# Patient Record
Sex: Female | Born: 1980 | Hispanic: Yes | Marital: Single | Smoking: Former smoker
Health system: Southern US, Community
[De-identification: ages and names within clinical notes are randomized; demographics above are authoritative.]

## PROBLEM LIST (undated history)

## (undated) ENCOUNTER — Inpatient Hospital Stay (HOSPITAL_COMMUNITY): Payer: Self-pay

## (undated) DIAGNOSIS — O1490 Unspecified pre-eclampsia, unspecified trimester: Secondary | ICD-10-CM

## (undated) DIAGNOSIS — O24419 Gestational diabetes mellitus in pregnancy, unspecified control: Secondary | ICD-10-CM

## (undated) DIAGNOSIS — Z87442 Personal history of urinary calculi: Secondary | ICD-10-CM

## (undated) DIAGNOSIS — I10 Essential (primary) hypertension: Secondary | ICD-10-CM

## (undated) HISTORY — DX: Gestational diabetes mellitus in pregnancy, unspecified control: O24.419

## (undated) HISTORY — DX: Unspecified pre-eclampsia, unspecified trimester: O14.90

---

## 2014-11-12 NOTE — L&D Delivery Note (Cosign Needed)
Delivery Note At  a viable female was delivered via  (Presentation:vertex ;loa  ).  APGAR:8 ,9 ; weight  .   Placenta status:spont ,via shultz .  Cord:3vc  with the following complications: none.  Cord pH: n/a  Anesthesia: Epidural  Episiotomy:  none Lacerations:  1st degrees perineal Suture Repair: 2.0 vicryl Est. Blood Loss150 (mL):    Mom to postpartum.  Baby to Couplet care / Skin to Skin.  Koren Shiver DARLENE 08/28/2015, 8:37 AM

## 2015-04-25 ENCOUNTER — Other Ambulatory Visit (HOSPITAL_COMMUNITY): Payer: Self-pay | Admitting: Physician Assistant

## 2015-04-25 DIAGNOSIS — Z3A21 21 weeks gestation of pregnancy: Secondary | ICD-10-CM

## 2015-04-25 DIAGNOSIS — Z3689 Encounter for other specified antenatal screening: Secondary | ICD-10-CM

## 2015-04-25 DIAGNOSIS — O09292 Supervision of pregnancy with other poor reproductive or obstetric history, second trimester: Secondary | ICD-10-CM

## 2015-04-25 LAB — OB RESULTS CONSOLE HEPATITIS B SURFACE ANTIGEN: Hepatitis B Surface Ag: NEGATIVE

## 2015-04-25 LAB — OB RESULTS CONSOLE HGB/HCT, BLOOD
HCT: 35 %
HEMOGLOBIN: 11.2 g/dL

## 2015-04-25 LAB — OB RESULTS CONSOLE ANTIBODY SCREEN: Antibody Screen: NEGATIVE

## 2015-04-25 LAB — GLUCOSE TOLERANCE, 1 HOUR (50G) W/O FASTING: Glucose, GTT - 1 Hour: 132 mg/dL (ref ?–200)

## 2015-04-25 LAB — SICKLE CELL SCREEN: Sickle Cell Screen: NORMAL

## 2015-04-25 LAB — OB RESULTS CONSOLE GC/CHLAMYDIA
Chlamydia: NEGATIVE
Gonorrhea: NEGATIVE

## 2015-04-25 LAB — OB RESULTS CONSOLE HIV ANTIBODY (ROUTINE TESTING): HIV: NONREACTIVE

## 2015-04-25 LAB — CYTOLOGY - PAP: Pap: NEGATIVE

## 2015-04-25 LAB — OB RESULTS CONSOLE PLATELET COUNT: PLATELETS: 274 10*3/uL

## 2015-04-25 LAB — CYSTIC FIBROSIS DIAGNOSTIC STUDY

## 2015-04-25 LAB — OB RESULTS CONSOLE VARICELLA ZOSTER ANTIBODY, IGG
VARICELLA IGG: IMMUNE
Varicella: IMMUNE

## 2015-04-25 LAB — OB RESULTS CONSOLE ABO/RH: RH Type: POSITIVE

## 2015-04-25 LAB — OB RESULTS CONSOLE RPR: RPR: NONREACTIVE

## 2015-04-25 LAB — OB RESULTS CONSOLE RUBELLA ANTIBODY, IGM: RUBELLA: IMMUNE

## 2015-04-27 ENCOUNTER — Ambulatory Visit (HOSPITAL_COMMUNITY)
Admission: RE | Admit: 2015-04-27 | Discharge: 2015-04-27 | Disposition: A | Discharge status: Home / Self Care | Payer: Self-pay | Source: Ambulatory Visit | Attending: Physician Assistant | Admitting: Physician Assistant

## 2015-04-27 ENCOUNTER — Encounter (HOSPITAL_COMMUNITY): Payer: Self-pay

## 2015-04-27 DIAGNOSIS — Z810 Family history of intellectual disabilities: Secondary | ICD-10-CM | POA: Insufficient documentation

## 2015-04-27 DIAGNOSIS — O352XX0 Maternal care for (suspected) hereditary disease in fetus, not applicable or unspecified: Secondary | ICD-10-CM | POA: Insufficient documentation

## 2015-04-27 DIAGNOSIS — Z315 Encounter for genetic counseling: Secondary | ICD-10-CM | POA: Insufficient documentation

## 2015-04-27 DIAGNOSIS — Z3689 Encounter for other specified antenatal screening: Secondary | ICD-10-CM

## 2015-04-27 DIAGNOSIS — O09292 Supervision of pregnancy with other poor reproductive or obstetric history, second trimester: Secondary | ICD-10-CM

## 2015-04-27 DIAGNOSIS — O10919 Unspecified pre-existing hypertension complicating pregnancy, unspecified trimester: Secondary | ICD-10-CM | POA: Insufficient documentation

## 2015-04-27 DIAGNOSIS — O0990 Supervision of high risk pregnancy, unspecified, unspecified trimester: Secondary | ICD-10-CM | POA: Insufficient documentation

## 2015-04-27 DIAGNOSIS — Z3A21 21 weeks gestation of pregnancy: Secondary | ICD-10-CM

## 2015-04-27 HISTORY — DX: Essential (primary) hypertension: I10

## 2015-04-27 NOTE — Progress Notes (Signed)
Genetic Counseling  Visit Summary Note  Appointment Date: 04/27/2015 Referred By: Randolm Idol, PA-C  Date of Birth: Nov 28, 1980  Pregnancy history: G2P1001 Estimated Date of Delivery: 09/05/15 Estimated Gestational Age: [redacted]w[redacted]d I met with Ms. RHuntington for genetic counseling because of a family history of a sister with mild mental retardation. In person Spanish translation was provided.    In summary  Reviewed family history  Etiology is unclear, thus recurrence information and options for testing and screening are not available.  We began by reviewing the family history in detail. Ms. MSherrye Payorreported that she has a 22year old sister who has mild mental retardation.  She reported that her sister was delivered by forceps, and her mom had tachycardia during the pregnancy.  Her sister finished school, but does not work because she is an aPrintmaker(shot-put).  She states that her sister can read and write and communicates well, but can not drive a car or live on her own.  Her sister is reported to be non-dysmorphic and does not have any health problems.  Ms. MSherrye Payorreported that her first child, a son, was born by emergent c-section at 34 weeks  He required tubes in his ears and has a speech delay. He has not had a genetic evaluation and she reports that he does well in school but gets help for his speech.  We discussed that her son's speech delay could be a consequence of the same condition that required him to have tubes placed in his ears, compounded by prematurity, and hearing and learning two languages.  Her sister's differences could be related to complications surrounding her birth and whatever necessitated a forceps delivery.  Alternatively, they could each have the same or a different genetic etiology for their learning differences.  Without knowing if the etiologies for the described differences are related or even genetic, it is not possible to provide recurrence risk  information or offer genetic testing or screening for this pregnancy.    The family histories were otherwise found to be noncontributory for birth defects, mental retardation, and known genetic conditions. Without further information regarding the provided family history, an accurate genetic risk cannot be calculated. Further genetic counseling is warranted if more information is obtained.  Ms. MSherrye Payorwas provided with written information regarding sickle cell anemia (SCA) including the carrier frequency and incidence in the Hispanic population, the availability of carrier testing and prenatal diagnosis if indicated.  In addition, we discussed that hemoglobinopathies are routinely screened for as part of the [place] newborn screening panel.  She declined hemoglobin electrophoresis today.   Ms. MSherrye Payordenied exposure to environmental toxins or chemical agents. She denied the use of alcohol, tobacco or street drugs. She denied significant viral illnesses during the course of her pregnancy. Her medical and surgical histories were noncontributory.   I counseled Ms. MWynelle Fannyregarding the above risks and available options.  The approximate face-to-face time with the genetic counselor was 40 minutes.  DCam Hai MS Certified Genetic Counselor

## 2015-05-04 ENCOUNTER — Other Ambulatory Visit (HOSPITAL_COMMUNITY): Payer: Self-pay | Admitting: Physician Assistant

## 2015-05-12 ENCOUNTER — Encounter: Payer: Self-pay | Admitting: Family

## 2015-05-12 ENCOUNTER — Ambulatory Visit (INDEPENDENT_AMBULATORY_CARE_PROVIDER_SITE_OTHER): Payer: Self-pay | Admitting: Family

## 2015-05-12 VITALS — BP 117/71 | HR 69 | Temp 98.2°F | Ht 64.57 in | Wt 194.0 lb

## 2015-05-12 DIAGNOSIS — O10912 Unspecified pre-existing hypertension complicating pregnancy, second trimester: Secondary | ICD-10-CM

## 2015-05-12 DIAGNOSIS — Z98891 History of uterine scar from previous surgery: Secondary | ICD-10-CM | POA: Insufficient documentation

## 2015-05-12 DIAGNOSIS — Z209 Contact with and (suspected) exposure to unspecified communicable disease: Secondary | ICD-10-CM | POA: Insufficient documentation

## 2015-05-12 DIAGNOSIS — O09892 Supervision of other high risk pregnancies, second trimester: Secondary | ICD-10-CM

## 2015-05-12 DIAGNOSIS — O09292 Supervision of pregnancy with other poor reproductive or obstetric history, second trimester: Secondary | ICD-10-CM

## 2015-05-12 DIAGNOSIS — O3421 Maternal care for scar from previous cesarean delivery: Secondary | ICD-10-CM

## 2015-05-12 DIAGNOSIS — O34219 Maternal care for unspecified type scar from previous cesarean delivery: Secondary | ICD-10-CM | POA: Insufficient documentation

## 2015-05-12 DIAGNOSIS — O09219 Supervision of pregnancy with history of pre-term labor, unspecified trimester: Secondary | ICD-10-CM

## 2015-05-12 DIAGNOSIS — O09899 Supervision of other high risk pregnancies, unspecified trimester: Secondary | ICD-10-CM | POA: Insufficient documentation

## 2015-05-12 DIAGNOSIS — O09212 Supervision of pregnancy with history of pre-term labor, second trimester: Secondary | ICD-10-CM

## 2015-05-12 LAB — POCT URINALYSIS DIP (DEVICE)
Bilirubin Urine: NEGATIVE
Glucose, UA: NEGATIVE mg/dL
HGB URINE DIPSTICK: NEGATIVE
Ketones, ur: NEGATIVE mg/dL
Leukocytes, UA: NEGATIVE
Nitrite: NEGATIVE
PROTEIN: NEGATIVE mg/dL
Specific Gravity, Urine: 1.01 (ref 1.005–1.030)
UROBILINOGEN UA: 0.2 mg/dL (ref 0.0–1.0)
pH: 5 (ref 5.0–8.0)

## 2015-05-12 NOTE — Progress Notes (Signed)
   Subjective:    [name] is a [id number] [redacted]w[redacted]d being seen today for her first obstetrical visit.  Her obstetrical history is significant for chronic hypertension x 2 years.  [facility]  Also history of preterm csection at 36 wks for eclampsia.  Possible exposure to [place]. . Patient does intend to breast feed. Pregnancy history fully reviewed.  Patient reports no complaints.  Filed Vitals:   05/12/15 0753 05/12/15 0805  BP: 117/71   Pulse: 69   Temp: 98.2 F (36.8 C)   Height:  5' 4.57" (1.64 m)  Weight: 194 lb (87.998 kg)     HISTORY: OB History  Gravida Para Term Preterm AB SAB TAB Ectopic Multiple Living  2 1 0 1 0 0 0 0 0 1     # Outcome Date GA Lbr Len/2nd Weight Sex Delivery Anes PTL Lv  2 Current           1 Preterm 05/25/04 [redacted]w[redacted]d  4 lb 6.6 oz (2 kg)  CS-Unspec        Past Medical History  Diagnosis Date  . Hypertension     2014  . Pre-eclampsia    Past Surgical History  Procedure Laterality Date  . Cesarean section     Family History  Problem Relation Age of Onset  . Diabetes Mother      Exam   See records   Assessment:    Pregnancy: G2P0101 Patient Active Problem List   Diagnosis Date Noted  . Previous cesarean delivery affecting pregnancy, antepartum 05/12/2015  . History of preterm delivery, currently pregnant 05/12/2015  . Prior poor obstetrical history in second trimester, antepartum   . Chronic hypertension in obstetric context in second trimester   . Hereditary disease in family possibly affecting fetus, affecting management of mother, antepartum condition or complication   Possible exposure to [place] (travel to [place])      Plan:     Reviewed records Prenatal vitamins. Begin Baby ASA Problem list reviewed and updated. Genetic Screening discussed Quad Screen: results reviewed. Ultrasound discussed; fetal survey: scheduled growth [place] 05/25/15. Follow up in 3 weeks.   Gwen Pounds 05/12/2015

## 2015-05-12 NOTE — Progress Notes (Signed)
Transfer from [facility] for chronic HTN.  Do not yet have records.  Dori present as Astronomer.

## 2015-05-13 LAB — PROTEIN / CREATININE RATIO, URINE
Creatinine, Urine: 50.1 mg/dL
Protein Creatinine Ratio: 0.1 (ref <0.15)
TOTAL PROTEIN, URINE: 5 mg/dL (ref 5–24)

## 2015-05-17 ENCOUNTER — Encounter: Payer: Self-pay | Admitting: *Deleted

## 2015-05-23 ENCOUNTER — Other Ambulatory Visit (HOSPITAL_COMMUNITY): Payer: Self-pay | Admitting: Maternal and Fetal Medicine

## 2015-05-23 DIAGNOSIS — Z98891 History of uterine scar from previous surgery: Secondary | ICD-10-CM

## 2015-05-23 DIAGNOSIS — O10012 Pre-existing essential hypertension complicating pregnancy, second trimester: Secondary | ICD-10-CM

## 2015-05-23 DIAGNOSIS — O09299 Supervision of pregnancy with other poor reproductive or obstetric history, unspecified trimester: Secondary | ICD-10-CM

## 2015-05-23 DIAGNOSIS — O352XX0 Maternal care for (suspected) hereditary disease in fetus, not applicable or unspecified: Secondary | ICD-10-CM

## 2015-05-25 ENCOUNTER — Ambulatory Visit (HOSPITAL_COMMUNITY)
Admission: RE | Admit: 2015-05-25 | Discharge: 2015-05-25 | Disposition: A | Discharge status: Home / Self Care | Payer: Self-pay | Source: Ambulatory Visit | Attending: Physician Assistant | Admitting: Physician Assistant

## 2015-05-25 ENCOUNTER — Telehealth: Payer: Self-pay | Admitting: *Deleted

## 2015-05-25 ENCOUNTER — Encounter (HOSPITAL_COMMUNITY): Payer: Self-pay

## 2015-05-25 DIAGNOSIS — Z98891 History of uterine scar from previous surgery: Secondary | ICD-10-CM

## 2015-05-25 DIAGNOSIS — Z0489 Encounter for examination and observation for other specified reasons: Secondary | ICD-10-CM | POA: Insufficient documentation

## 2015-05-25 DIAGNOSIS — O09299 Supervision of pregnancy with other poor reproductive or obstetric history, unspecified trimester: Secondary | ICD-10-CM | POA: Insufficient documentation

## 2015-05-25 DIAGNOSIS — O10019 Pre-existing essential hypertension complicating pregnancy, unspecified trimester: Secondary | ICD-10-CM | POA: Insufficient documentation

## 2015-05-25 DIAGNOSIS — Z3A00 Weeks of gestation of pregnancy not specified: Secondary | ICD-10-CM | POA: Insufficient documentation

## 2015-05-25 DIAGNOSIS — O352XX0 Maternal care for (suspected) hereditary disease in fetus, not applicable or unspecified: Secondary | ICD-10-CM | POA: Insufficient documentation

## 2015-05-25 DIAGNOSIS — O3421 Maternal care for scar from previous cesarean delivery: Secondary | ICD-10-CM | POA: Insufficient documentation

## 2015-05-25 DIAGNOSIS — O10912 Unspecified pre-existing hypertension complicating pregnancy, second trimester: Secondary | ICD-10-CM

## 2015-05-25 DIAGNOSIS — IMO0002 Reserved for concepts with insufficient information to code with codable children: Secondary | ICD-10-CM | POA: Insufficient documentation

## 2015-05-25 DIAGNOSIS — Z3A25 25 weeks gestation of pregnancy: Secondary | ICD-10-CM | POA: Insufficient documentation

## 2015-05-25 DIAGNOSIS — O10012 Pre-existing essential hypertension complicating pregnancy, second trimester: Secondary | ICD-10-CM | POA: Insufficient documentation

## 2015-05-25 MED ORDER — LABETALOL HCL 200 MG PO TABS: 200.0000 mg | ORAL_TABLET | Freq: Two times a day (BID) | ORAL | Status: DC

## 2015-05-25 NOTE — Telephone Encounter (Signed)
Pt stopped by clinic to request refill on Labetalol. Patient had bottle with her which was Labetalol 200 mg twice a day. Per [place] rx sent to pharmacy based on what patient is currently taking.

## 2015-06-02 ENCOUNTER — Ambulatory Visit (INDEPENDENT_AMBULATORY_CARE_PROVIDER_SITE_OTHER): Payer: Self-pay | Admitting: Family Medicine

## 2015-06-02 VITALS — BP 116/55 | HR 62 | Temp 98.0°F | Wt 198.6 lb

## 2015-06-02 DIAGNOSIS — O0993 Supervision of high risk pregnancy, unspecified, third trimester: Secondary | ICD-10-CM

## 2015-06-02 DIAGNOSIS — Z23 Encounter for immunization: Secondary | ICD-10-CM

## 2015-06-02 DIAGNOSIS — O0992 Supervision of high risk pregnancy, unspecified, second trimester: Secondary | ICD-10-CM

## 2015-06-02 DIAGNOSIS — O34219 Maternal care for unspecified type scar from previous cesarean delivery: Secondary | ICD-10-CM

## 2015-06-02 DIAGNOSIS — O10912 Unspecified pre-existing hypertension complicating pregnancy, second trimester: Secondary | ICD-10-CM

## 2015-06-02 DIAGNOSIS — O3421 Maternal care for scar from previous cesarean delivery: Secondary | ICD-10-CM

## 2015-06-02 LAB — POCT URINALYSIS DIP (DEVICE)
Bilirubin Urine: NEGATIVE
Glucose, UA: NEGATIVE mg/dL
Hgb urine dipstick: NEGATIVE
KETONES UR: NEGATIVE mg/dL
NITRITE: NEGATIVE
Protein, ur: NEGATIVE mg/dL
Specific Gravity, Urine: 1.02 (ref 1.005–1.030)
Urobilinogen, UA: 0.2 mg/dL (ref 0.0–1.0)
pH: 7 (ref 5.0–8.0)

## 2015-06-02 LAB — CBC
HCT: 32.7 % — ABNORMAL LOW (ref 36.0–46.0)
HEMOGLOBIN: 10.9 g/dL — AB (ref 12.0–15.0)
MCH: 30.4 pg (ref 26.0–34.0)
MCHC: 33.3 g/dL (ref 30.0–36.0)
MCV: 91.1 fL (ref 78.0–100.0)
MPV: 9.8 fL (ref 8.6–12.4)
Platelets: 271 10*3/uL (ref 150–400)
RBC: 3.59 MIL/uL — ABNORMAL LOW (ref 3.87–5.11)
RDW: 13.9 % (ref 11.5–15.5)
WBC: 7 10*3/uL (ref 4.0–10.5)

## 2015-06-02 MED ORDER — TETANUS-DIPHTH-ACELL PERTUSSIS 5-2.5-18.5 LF-MCG/0.5 IM SUSP
0.5000 mL | Freq: Once | INTRAMUSCULAR | Status: AC
  Administered 2015-06-02: 0.5 mL via INTRAMUSCULAR

## 2015-06-02 NOTE — Patient Instructions (Signed)
Tercer trimestre de Media planner (Third Trimester of Pregnancy) El tercer trimestre va desde la semana29 hasta la 68, desde el sptimo hasta el noveno mes, y es la poca en la que el feto crece ms rpidamente. Hacia el final del noveno mes, el feto mide alrededor de 20pulgadas (45cm) de largo y pesa entre 6 y 70 libras (2,700 y 86,500kg).  CAMBIOS EN EL ORGANISMO Su organismo atraviesa por muchos cambios durante el [place], y estos varan de Ardelia Mems mujer a Theatre manager.   Seguir [employer]. Es de esperar que aumente entre 25 y 35libras (60 y 16kg) hacia el final del Media planner.  Podrn aparecer las primeras Apache Corporation caderas, el abdomen y las [place].  Puede tener necesidad de Garment/textile technologist con ms frecuencia porque el feto baja hacia la pelvis y ejerce presin sobre la vejiga.  Debido al Glennis Brink podr sentir Victorio Palm estomacal con frecuencia.  Puede estar estreida, ya que ciertas hormonas enlentecen los movimientos de los msculos que JPM[employer] Chase [employer] Co desechos a travs de los intestinos.  Pueden aparecer hemorroides o abultarse e hincharse las venas (venas varicosas).  Puede sentir dolor plvico debido al Medtronic y a que las hormonas del Scientist, research (life sciences) las articulaciones entre los huesos de la pelvis. El dolor de espalda puede ser consecuencia de la sobrecarga de los msculos que soportan la [place].  Tal vez haya cambios en el cabello que pueden incluir su engrosamiento, crecimiento rpido y cambios en la textura. Adems, a algunas mujeres se les cae el cabello durante o despus del embarazo, o tienen el cabello seco o fino. Lo ms probable es que el cabello se le normalice despus del nacimiento del beb.  Las Lincoln National Corporation seguirn creciendo y Teaching laboratory technician. A veces, puede haber una secrecin amarilla de las mamas llamada calostro.  El ombligo puede salir hacia afuera.  Puede sentir que le falta el aire debido a que se expande el tero.  Puede notar que el feto "baja" o lo siente ms bajo, en el  abdomen.  Puede tener una prdida de secrecin mucosa con sangre. Esto suele ocurrir en el trmino de unos pocos das a una semana antes de que comience el [place].  El cuello del tero se vuelve delgado y blando (se borra) cerca de la fecha de [place]. QU DEBE ESPERAR EN LOS EXMENES PRENATALES  Le harn exmenes prenatales cada 2semanas hasta la semana36. A partir de ese momento le harn exmenes semanales. Durante una visita prenatal de rutina:  La pesarn para asegurarse de que usted y el feto estn creciendo normalmente.  Le tomarn la presin arterial.  Le medirn el abdomen para controlar el desarrollo del beb.  Se escucharn los latidos cardacos fetales.  Se evaluarn los resultados de los estudios solicitados en visitas anteriores.  Le revisarn el cuello del tero cuando est prxima la fecha de parto para controlar si este se ha borrado. Alrededor de la semana36, el mdico le revisar el cuello del tero. Al mismo tiempo, realizar un anlisis de las secreciones del tejido vaginal. Este examen es para determinar si hay un tipo de bacteria, estreptococo Grupo B. El mdico le explicar esto con ms detalle. El mdico puede preguntarle lo siguiente:  Cmo le gustara que fuera el [place].  Cmo se siente.  Si siente los movimientos del beb.  Si ha tenido sntomas anormales, como prdida de lquido, [place], dolores de cabeza intensos o clicos abdominales.  Si tiene Sunoco. Otros exmenes o estudios de deteccin que pueden realizarse  durante el tercer trimestre incluyen lo siguiente:  Anlisis de sangre para controlar las concentraciones de hierro (anemia).  Controles fetales para determinar su salud, nivel de [place] y Mining engineer. Si tiene [place] enfermedad o hay problemas durante el embarazo, le harn estudios. FALSO TRABAJO DE PARTO Es posible que sienta contracciones leves e irregulares que finalmente desaparecen. Se llaman contracciones de  Braxton Hicks o falso trabajo de [place]. Las Yahoo pueden durar horas, das o incluso semanas, antes de que el verdadero trabajo de parto se inicie. Si las contracciones ocurren a intervalos regulares, se intensifican o se hacen dolorosas, lo mejor es que la revise el mdico.  SIGNOS DE TRABAJO DE PARTO   Clicos de tipo menstrual.  Contracciones cada 63minutos o menos.  Contracciones que comienzan en la parte superior del tero y se extienden hacia abajo, a la zona inferior del abdomen y la espalda.  Sensacin de mayor presin en la pelvis o dolor de espalda.  Una secrecin de mucosidad acuosa o con sangre que sale de la vagina. Si tiene alguno de estos signos antes de la [id number] del Media planner, llame a su mdico de inmediato. Debe concurrir al hospital para que la controlen inmediatamente. INSTRUCCIONES PARA EL CUIDADO EN EL HOGAR   Evite fumar, consumir hierbas, beber alcohol y tomar frmacos que no le hayan recetado. Estas sustancias qumicas afectan la formacin y el desarrollo del beb.  Mc[place]ee mdico en relacin con el uso de medicamentos. Durante el embarazo, hay medicamentos que son seguros de tomar y otros que no.  Haga actividad fsica solo en la forma indicada por el mdico. Sentir clicos uterinos es un buen signo para Ambulance person actividad fsica.  Contine comiendo alimentos que sanos con regularidad.  Use un sostn que le brinde buen soporte si le Nordstrom.  No se d baos de inmersin en agua caliente, baos turcos ni saunas.  Colquese el cinturn de seguridad cuando conduzca.  No coma carne cruda ni queso sin cocinar; evite el contacto con las bandejas sanitarias de los gatos y la tierra que estos animales usan. Estos elementos contienen grmenes que pueden causar defectos congnitos en el beb.  [place].  Si est estreida, pruebe un laxante suave (si el mdico lo autoriza). Consuma ms alimentos ricos en  fibra, como vegetales y frutas frescos y Psychologist, prison and probation services. Beba gran cantidad de lquido para mantener la orina de tono claro o color amarillo plido.  Dese baos de asiento con agua tibia para Best boy o las molestias causadas por las hemorroides. Use una crema para las hemorroides si el mdico la autoriza.  Si tiene venas varicosas, use medias de descanso. Eleve los pies durante 30minutos, 3 o 4veces por da. Limite la cantidad de sal en su dieta.  Evite levantar objetos pesados, use zapatos de tacones bajos y [place].  Descanse con las piernas elevadas si tiene calambres o dolor de cintura.  Visite a su dentista si no lo ha Quarry manager. Use un cepillo de dientes blando para higienizarse los dientes y psese el hilo dental con suavidad.  Puede seguir [employer] Electric Power, a menos que el mdico le indique lo contrario.  No haga viajes largos excepto que sea absolutamente necesario y solo con la autorizacin del [place] clases prenatales para Development worker, international aid, Psychologist, prison and probation services y hacer preguntas sobre el [place] de parto y [place].  Haga un ensayo de la partida al hospital.  Prepare el bolso que  llevar al hospital.  Prepare la habitacin del beb.  Concurra a todas las visitas prenatales segn las indicaciones de su mdico. SOLICITE ATENCIN MDICA SI:  No est segura de que est en trabajo de parto o de que ha roto la bolsa de las aguas.  Tiene mareos.  Siente clicos leves, presin en la pelvis o dolor persistente en el abdomen.  Tiene nuseas, vmitos o diarrea persistentes.  Tiene secrecin vaginal con mal olor.  Siente dolor al Continental Airlines. SOLICITE ATENCIN MDICA DE INMEDIATO SI:   Tiene fiebre.  Tiene una prdida de lquido por la vagina.  Tiene sangrado o pequeas prdidas vaginales.  Siente dolor intenso o clicos en el abdomen.  Sube o baja de peso rpidamente.  Tiene dificultad para respirar y siente dolor de  pecho.  Sbitamente se le hinchan mucho el rostro, las [place], los tobillos, los pies o las piernas.  No ha sentido los movimientos del beb durante Leone Brand.  Siente un dolor de cabeza intenso que no se alivia con medicamentos.  Hay cambios en la visin. Document Released: 08/08/2005 Document Revised: 11/03/2013 [facility] Patient Information 2015 [place]. This information is not intended to replace advice given to you by your health care provider. Make sure you discuss any questions you have with your health care provider.

## 2015-06-02 NOTE — Progress Notes (Signed)
Subjective:  Bonnie Morales is a 34 y.o. G2P0101 at [redacted]w[redacted]d being seen today for ongoing prenatal care.  Patient reports no complaints.  Contractions: Not present.  Vag. Bleeding: None. Movement: Present. Denies leaking of fluid.   Has chronic hypertension.  Has been taking labetalol.  Denies side effects to medication.  No complications.  The following portions of the patient's history were reviewed and updated as appropriate: allergies, current medications, past family history, past medical history, past social history, past surgical history and problem list.   Objective:   Filed Vitals:   06/02/15 0940  BP: 116/55  Pulse: 62  Temp: 98 F (36.7 C)  Weight: 198 lb 9.6 oz (90.084 kg)    Fetal Status: Fetal Heart Rate (bpm): 140   Movement: Present     General:  Alert, oriented and cooperative. Patient is in no acute distress.  Skin: Skin is warm and dry. No rash noted.   Cardiovascular: Normal heart rate noted  Respiratory: Normal respiratory effort, no problems with respiration noted  Abdomen: Soft, gravid, appropriate for gestational age. Pain/Pressure: Absent     Vaginal: Vag. Bleeding: None.       Cervix: Not evaluated        Extremities: Normal range of motion.  Edema: None  Mental Status: Normal mood and affect. Normal behavior. Normal judgment and thought content.   Urinalysis: Urine Protein: Negative Urine Glucose: Negative  Assessment and Plan:  Pregnancy: G2P0101 at [redacted]w[redacted]d  1. Previous cesarean delivery, antepartum condition or complication  - Glucose Tolerance, 1 HR (50g) - CBC - RPR - HIV antibody (with reflex)  2. Supervision of high risk pregnancy, antepartum, third trimester 28 week labs today Normal fetal heart tracing, normal fundal height  3. Chronic hypertension in obstetric context in second trimester Continue labetalol  4. Previous cesarean delivery affecting pregnancy, antepartum   Preterm labor symptoms and general obstetric precautions  including but not limited to vaginal bleeding, contractions, leaking of fluid and fetal movement were reviewed in detail with the patient. Please refer to After Visit Summary for other counseling recommendations.  Return in about 4 weeks (around 06/30/2015).   Truett Mainland, DO

## 2015-06-02 NOTE — Progress Notes (Signed)
Dori used for [employer] interpreter Reviewed tip of week with patient  Gave 28 wk packet to patient

## 2015-06-03 LAB — HIV ANTIBODY (ROUTINE TESTING W REFLEX): HIV 1&2 Ab, 4th Generation: NONREACTIVE

## 2015-06-03 LAB — GLUCOSE TOLERANCE, 1 HOUR (50G) W/O FASTING: Glucose, 1 Hour GTT: 124 mg/dL (ref 70–140)

## 2015-06-03 LAB — RPR

## 2015-06-20 ENCOUNTER — Other Ambulatory Visit (HOSPITAL_COMMUNITY): Payer: Self-pay | Admitting: Maternal and Fetal Medicine

## 2015-06-20 DIAGNOSIS — O34219 Maternal care for unspecified type scar from previous cesarean delivery: Secondary | ICD-10-CM

## 2015-06-20 DIAGNOSIS — O352XX0 Maternal care for (suspected) hereditary disease in fetus, not applicable or unspecified: Secondary | ICD-10-CM

## 2015-06-20 DIAGNOSIS — O09299 Supervision of pregnancy with other poor reproductive or obstetric history, unspecified trimester: Secondary | ICD-10-CM

## 2015-06-20 DIAGNOSIS — O99213 Obesity complicating pregnancy, third trimester: Secondary | ICD-10-CM

## 2015-06-20 DIAGNOSIS — Z3A29 29 weeks gestation of pregnancy: Secondary | ICD-10-CM

## 2015-06-20 DIAGNOSIS — O10919 Unspecified pre-existing hypertension complicating pregnancy, unspecified trimester: Secondary | ICD-10-CM

## 2015-06-22 ENCOUNTER — Ambulatory Visit (HOSPITAL_COMMUNITY)
Admission: RE | Admit: 2015-06-22 | Discharge: 2015-06-22 | Disposition: A | Discharge status: Home / Self Care | Payer: Self-pay | Source: Ambulatory Visit | Attending: Family Medicine | Admitting: Family Medicine

## 2015-06-22 DIAGNOSIS — O34219 Maternal care for unspecified type scar from previous cesarean delivery: Secondary | ICD-10-CM

## 2015-06-22 DIAGNOSIS — O352XX0 Maternal care for (suspected) hereditary disease in fetus, not applicable or unspecified: Secondary | ICD-10-CM | POA: Insufficient documentation

## 2015-06-22 DIAGNOSIS — E669 Obesity, unspecified: Secondary | ICD-10-CM | POA: Insufficient documentation

## 2015-06-22 DIAGNOSIS — O10919 Unspecified pre-existing hypertension complicating pregnancy, unspecified trimester: Secondary | ICD-10-CM

## 2015-06-22 DIAGNOSIS — O99213 Obesity complicating pregnancy, third trimester: Secondary | ICD-10-CM | POA: Insufficient documentation

## 2015-06-22 DIAGNOSIS — O10019 Pre-existing essential hypertension complicating pregnancy, unspecified trimester: Secondary | ICD-10-CM | POA: Insufficient documentation

## 2015-06-22 DIAGNOSIS — Z3A29 29 weeks gestation of pregnancy: Secondary | ICD-10-CM | POA: Insufficient documentation

## 2015-06-22 DIAGNOSIS — O3421 Maternal care for scar from previous cesarean delivery: Secondary | ICD-10-CM | POA: Insufficient documentation

## 2015-06-22 DIAGNOSIS — O09299 Supervision of pregnancy with other poor reproductive or obstetric history, unspecified trimester: Secondary | ICD-10-CM | POA: Insufficient documentation

## 2015-06-23 ENCOUNTER — Other Ambulatory Visit (HOSPITAL_COMMUNITY): Payer: Self-pay | Admitting: *Deleted

## 2015-06-23 DIAGNOSIS — I10 Essential (primary) hypertension: Secondary | ICD-10-CM

## 2015-06-30 ENCOUNTER — Ambulatory Visit (INDEPENDENT_AMBULATORY_CARE_PROVIDER_SITE_OTHER): Payer: Self-pay | Admitting: Family Medicine

## 2015-06-30 VITALS — BP 113/46 | HR 60 | Temp 98.0°F | Wt 201.7 lb

## 2015-06-30 DIAGNOSIS — O0993 Supervision of high risk pregnancy, unspecified, third trimester: Secondary | ICD-10-CM

## 2015-06-30 LAB — POCT URINALYSIS DIP (DEVICE)
BILIRUBIN URINE: NEGATIVE
Glucose, UA: NEGATIVE mg/dL
Hgb urine dipstick: NEGATIVE
Ketones, ur: NEGATIVE mg/dL
Leukocytes, UA: NEGATIVE
Nitrite: NEGATIVE
PH: 6.5 (ref 5.0–8.0)
PROTEIN: NEGATIVE mg/dL
SPECIFIC GRAVITY, URINE: 1.015 (ref 1.005–1.030)
Urobilinogen, UA: 1 mg/dL (ref 0.0–1.0)

## 2015-06-30 MED ORDER — DOCUSATE SODIUM 100 MG PO CAPS: 100.0000 mg | ORAL_CAPSULE | Freq: Two times a day (BID) | ORAL | Status: DC

## 2015-06-30 NOTE — Progress Notes (Signed)
Subjective:  Bonnie Morales is a 34 y.o. G2P0101 at [redacted]w[redacted]d being seen today for ongoing prenatal care.  Patient reports no complaints.  Contractions: Not present.  Vag. Bleeding: None. Movement: Present. Denies leaking of fluid.   Has chronic hypertension - Taking labetalol.  Had a couple of episodes of vomiting after taking the medication over the past 2 weeks.  The following portions of the patient's history were reviewed and updated as appropriate: allergies, current medications, past family history, past medical history, past social history, past surgical history and problem list.   Objective:   Filed Vitals:   06/30/15 0832  BP: 113/46  Pulse: 60  Temp: 98 F (36.7 C)  Weight: 201 lb 11.2 oz (91.491 kg)    Fetal Status: Fetal Heart Rate (bpm): 130   Movement: Present     General:  Alert, oriented and cooperative. Patient is in no acute distress.  Skin: Skin is warm and dry. No rash noted.   Cardiovascular: Normal heart rate noted  Respiratory: Normal respiratory effort, no problems with respiration noted  Abdomen: Soft, gravid, appropriate for gestational age. Pain/Pressure: Absent     Pelvic: Vag. Bleeding: None Vag D/C Character: White   Cervical exam deferred        Extremities: Normal range of motion.  Edema: None  Mental Status: Normal mood and affect. Normal behavior. Normal judgment and thought content.   Urinalysis:      Assessment and Plan:  Pregnancy: G2P0101 at [redacted]w[redacted]d  1. Supervision of high risk pregnancy, antepartum, third trimester FHT normal.  Fundal height a little above.  US shows 60%.    2. Chronic hypertension in obstetric context in second trimester Continue labetalol for now.  If vomiting continues, pt to call.  May need to change if persists. Start NST twice weekly.  Preterm labor symptoms and general obstetric precautions including but not limited to vaginal bleeding, contractions, leaking of fluid and fetal movement were reviewed in detail  with the patient. Please refer to After Visit Summary for other counseling recommendations.  No Follow-up on file.   Truett Mainland, DO

## 2015-06-30 NOTE — Patient Instructions (Signed)
Tercer trimestre del Media planner  (Third Trimester of Pregnancy)  El tercer trimestre del embarazo abarca desde la semana 18 hasta la semana 71, desde el 7 mes hasta el 9. En este trimestre el beb (feto) se desarrolla muy rpidamente. Hacia el final del noveno mes, el beb que an no ha nacido mide alrededor de 20 pulgadas (45 cm) de Production designer, theatre/television/film. Y pesa entre 6 y 62 libras (2,700 y 4,500 kg).  CUIDADOS EN EL HOGAR   Evite fumar, consumir hierbas y beber alcohol. Evite los frmacos que no apruebe el mdico.  Slo tome los medicamentos que le haya indicado su mdico. Algunos medicamentos son seguros para tomar durante el Media planner y otros no lo son.  Haga ejercicios slo como le indique el mdico. Deje de hacer ejercicios si comienza a tener clicos.  Haga comidas regulares y sanas.  Use un sostn que le brinde buen soporte si sus mamas estn sensibles.  No utilice la baera con agua caliente, baos turcos y saunas.  Colquese el cinturn de seguridad cuando conduzca.  Evite comer carne cruda y el contacto con los utensilios y desperdicios de los gatos.  Tome las vitaminas indicadas para la etapa prenatal.  Trate de tomar medicamentos para mover el intestino (laxantes) segn lo necesario y si su [place]. Consuma ms fibra comiendo frutas y Photographer frescos y granos enteros. Beba gran cantidad de lquido para mantener el pis (orina) de tono claro o amarillo plido.  Tome baos de agua tibia (baos de asiento) para Glass blower/designer o las molestias causadas por las hemorroides. Use una crema para las hemorroides si el mdico la autoriza.  Si tiene venas hinchadas y abultadas (venas varicosas), use medias de soporte. Eleve (levante) los pies durante 15 minutos, 3 o 4 veces por da. Limite el consumo de sal en su dieta.  Evite levantar objetos pesados, usar tacones altos y sintese derecha.  Descanse con las piernas elevadas si tiene calambres o dolor de cintura.  Visite a su dentista  si no lo ha Quarry manager. Use un cepillo de dientes blando para higienizarse los dientes. Use suavemente el hilo dental.  Puede tener sexo (relaciones sexuales) siempre que el mdico la autorice.  No viaje por largas distancias si puede evitarlo. Slo hgalo con la aprobacin de su mdico.  Haga el curso pre parto.  Practique conducir [employer].  Prepare el bolso que llevar.  Prepare la habitacin del beb.  Concurra a los controles mdicos. SOLICITE AYUDA SI:   No est segura si est en trabajo de parto o ha roto la bolsa de aguas.  Tiene mareos.  Siente clicos intensos o presin en la zona baja del vientre (abdomen).  Siente un dolor persistente en la zona del vientre.  Tiene Higher education careers adviser (nuseas), devuelve (vomita), o tiene deposiciones acuosas (diarrea).  Advierte un olor ftido que proviene de la vagina.  Siente dolor al hacer pis (orinar). SOLICITE AYUDA DE INMEDIATO SI:   Tiene fiebre.  Pierde lquido o sangre por la vagina.  Tiene sangrando o pequeas prdidas vaginales.  Siente dolor intenso o clicos en el abdomen.  Sube o baja de peso rpidamente.  Tiene dificultad para respirar o Electronics engineer.  Sbitamente se le hinchan el rostro, las manos, los tobillos, los pies o las piernas.  No ha sentido los movimientos del beb durante Leone Brand.  Siente un dolor de cabeza intenso que no se alivia con medicamentos.  Su visin se modifica. Document  Released: 07/01/2013 ExitCare Patient Information 2015 [place]. This information is not intended to replace advice given to you by your health care provider. Make sure you discuss any questions you have with your health care provider.

## 2015-06-30 NOTE — Progress Notes (Signed)
Marly used for interpreter Reviewed tip of week with patient

## 2015-07-14 ENCOUNTER — Ambulatory Visit (INDEPENDENT_AMBULATORY_CARE_PROVIDER_SITE_OTHER): Payer: Self-pay | Admitting: Family Medicine

## 2015-07-14 VITALS — BP 115/61 | HR 63 | Wt 200.4 lb

## 2015-07-14 DIAGNOSIS — O0993 Supervision of high risk pregnancy, unspecified, third trimester: Secondary | ICD-10-CM

## 2015-07-14 DIAGNOSIS — O10913 Unspecified pre-existing hypertension complicating pregnancy, third trimester: Secondary | ICD-10-CM

## 2015-07-14 DIAGNOSIS — I10 Essential (primary) hypertension: Secondary | ICD-10-CM

## 2015-07-14 DIAGNOSIS — O10912 Unspecified pre-existing hypertension complicating pregnancy, second trimester: Secondary | ICD-10-CM

## 2015-07-14 LAB — POCT URINALYSIS DIP (DEVICE)
Bilirubin Urine: NEGATIVE
Glucose, UA: NEGATIVE mg/dL
Hgb urine dipstick: NEGATIVE
Ketones, ur: NEGATIVE mg/dL
NITRITE: NEGATIVE
PH: 6.5 (ref 5.0–8.0)
PROTEIN: NEGATIVE mg/dL
Specific Gravity, Urine: 1.02 (ref 1.005–1.030)
Urobilinogen, UA: 0.2 mg/dL (ref 0.0–1.0)

## 2015-07-14 NOTE — Progress Notes (Signed)
Interpreter Lavonne Chick present for encounter.  Breastfeeding tip of the week reviewed.  [place] for growth/BPP scheduled 9/7.

## 2015-07-14 NOTE — Progress Notes (Signed)
Subjective:  Bonnie Morales is a 34 y.o. G2P0101 at [redacted]w[redacted]d being seen today for ongoing prenatal care.  Patient reports heartburn.  Contractions: Not present.   . Movement: Present. Denies leaking of fluid.   The following portions of the patient's history were reviewed and updated as appropriate: allergies, current medications, past family history, past medical history, past social history, past surgical history and problem list.   Objective:   Filed Vitals:   07/14/15 1120  BP: 115/61  Pulse: 63  Weight: 200 lb 6.4 oz (90.901 kg)    Fetal Status: Fetal Heart Rate (bpm): NST   Movement: Present     General:  Alert, oriented and cooperative. Patient is in no acute distress.  Skin: Skin is warm and dry. No rash noted.   Cardiovascular: Normal heart rate noted  Respiratory: Normal respiratory effort, no problems with respiration noted  Abdomen: Soft, gravid, appropriate for gestational age. Pain/Pressure: Present     Pelvic:       Cervical exam deferred        Extremities: Normal range of motion.     Mental Status: Normal mood and affect. Normal behavior. Normal judgment and thought content.   Urinalysis:      Assessment and Plan:  Pregnancy: G2P0101 at [redacted]w[redacted]d  1. Chronic hypertension complicating or reason for care during pregnancy, third trimester NST reactive Continue labetalol and ASA - Fetal nonstress test - [place] MFM FETAL BPP W/NONSTRESS; Future  2. Supervision of high risk pregnancy, antepartum, third trimester FHT normal  3. Chronic hypertension in obstetric context in second trimester  4.  Heartburn in Pregnancy - Tums  Preterm labor symptoms and general obstetric precautions including but not limited to vaginal bleeding, contractions, leaking of fluid and fetal movement were reviewed in detail with the patient. Please refer to After Visit Summary for other counseling recommendations.  Return in about 11 days (around 07/25/2015) for as scheduled .   Truett Mainland, DO

## 2015-07-14 NOTE — Patient Instructions (Signed)
Tercer trimestre del embarazo  (Third Trimester of Pregnancy)  El tercer trimestre del embarazo abarca desde la semana 29 hasta la semana 42, desde el 7 mes hasta el 9. En este trimestre el beb (feto) se desarrolla muy rpidamente. Hacia el final del noveno mes, el beb que an no ha nacido mide alrededor de 20 pulgadas (45 cm) de largo. Y pesa entre 6 y 10 libras (2,700 y 4,500 kg).  CUIDADOS EN EL HOGAR   Evite fumar, consumir hierbas y beber alcohol. Evite los frmacos que no apruebe el mdico.  Slo tome los medicamentos que le haya indicado su mdico. Algunos medicamentos son seguros para tomar durante el embarazo y otros no lo son.  Haga ejercicios slo como le indique el mdico. Deje de hacer ejercicios si comienza a tener clicos.  Haga comidas regulares y sanas.  Use un sostn que le brinde buen soporte si sus mamas estn sensibles.  No utilice la baera con agua caliente, baos turcos y saunas.  Colquese el cinturn de seguridad cuando conduzca.  Evite comer carne cruda y el contacto con los utensilios y desperdicios de los gatos.  Tome las vitaminas indicadas para la etapa prenatal.  Trate de tomar medicamentos para mover el intestino (laxantes) segn lo necesario y si su mdico la autoriza. Consuma ms fibra comiendo frutas y vegetales frescos y granos enteros. Beba gran cantidad de lquido para mantener el pis (orina) de tono claro o amarillo plido.  Tome baos de agua tibia (baos de asiento) para calmar el dolor o las molestias causadas por las hemorroides. Use una crema para las hemorroides si el mdico la autoriza.  Si tiene venas hinchadas y abultadas (venas varicosas), use medias de soporte. Eleve (levante) los pies durante 15 minutos, 3 o 4 veces por da. Limite el consumo de sal en su dieta.  Evite levantar objetos pesados, usar tacones altos y sintese derecha.  Descanse con las piernas elevadas si tiene calambres o dolor de cintura.  Visite a su dentista  si no lo ha hecho durante el embarazo. Use un cepillo de dientes blando para higienizarse los dientes. Use suavemente el hilo dental.  Puede tener sexo (relaciones sexuales) siempre que el mdico la autorice.  No viaje por largas distancias si puede evitarlo. Slo hgalo con la aprobacin de su mdico.  Haga el curso pre parto.  Practique conducir hasta el hospital.  Prepare el bolso que llevar.  Prepare la habitacin del beb.  Concurra a los controles mdicos. SOLICITE AYUDA SI:   No est segura si est en trabajo de parto o ha roto la bolsa de aguas.  Tiene mareos.  Siente clicos intensos o presin en la zona baja del vientre (abdomen).  Siente un dolor persistente en la zona del vientre.  Tiene malestar estomacal (nuseas), devuelve (vomita), o tiene deposiciones acuosas (diarrea).  Advierte un olor ftido que proviene de la vagina.  Siente dolor al hacer pis (orinar). SOLICITE AYUDA DE INMEDIATO SI:   Tiene fiebre.  Pierde lquido o sangre por la vagina.  Tiene sangrando o pequeas prdidas vaginales.  Siente dolor intenso o clicos en el abdomen.  Sube o baja de peso rpidamente.  Tiene dificultad para respirar o siente dolor en el pecho.  Sbitamente se le hinchan el rostro, las manos, los tobillos, los pies o las piernas.  No ha sentido los movimientos del beb durante una hora.  Siente un dolor de cabeza intenso que no se alivia con medicamentos.  Su visin se modifica. Document   Released: 07/01/2013 [employer] Patient Information 2015 [employer]. This information is not intended to replace advice given to you by your health care provider. Make sure you discuss any questions you have with your health care provider.  

## 2015-07-20 ENCOUNTER — Ambulatory Visit (HOSPITAL_COMMUNITY)
Admission: RE | Admit: 2015-07-20 | Discharge: 2015-07-20 | Disposition: A | Discharge status: Home / Self Care | Payer: Self-pay | Source: Ambulatory Visit | Attending: Family Medicine | Admitting: Family Medicine

## 2015-07-20 ENCOUNTER — Encounter (HOSPITAL_COMMUNITY): Payer: Self-pay

## 2015-07-20 ENCOUNTER — Ambulatory Visit (HOSPITAL_COMMUNITY)
Admission: RE | Admit: 2015-07-20 | Discharge: 2015-07-20 | Disposition: A | Discharge status: Home / Self Care | Payer: Self-pay | Source: Ambulatory Visit | Attending: Maternal and Fetal Medicine | Admitting: Maternal and Fetal Medicine

## 2015-07-20 DIAGNOSIS — I10 Essential (primary) hypertension: Secondary | ICD-10-CM | POA: Insufficient documentation

## 2015-07-20 DIAGNOSIS — O10913 Unspecified pre-existing hypertension complicating pregnancy, third trimester: Secondary | ICD-10-CM | POA: Insufficient documentation

## 2015-07-25 ENCOUNTER — Ambulatory Visit (INDEPENDENT_AMBULATORY_CARE_PROVIDER_SITE_OTHER): Payer: Self-pay | Admitting: *Deleted

## 2015-07-25 VITALS — BP 115/58 | HR 68

## 2015-07-25 DIAGNOSIS — O10913 Unspecified pre-existing hypertension complicating pregnancy, third trimester: Secondary | ICD-10-CM

## 2015-07-25 NOTE — Progress Notes (Signed)
NST reactive.

## 2015-07-25 NOTE — Progress Notes (Signed)
Interpreter Mariel Gallego present for encounter.   

## 2015-07-28 ENCOUNTER — Ambulatory Visit (INDEPENDENT_AMBULATORY_CARE_PROVIDER_SITE_OTHER): Payer: Self-pay | Admitting: Obstetrics & Gynecology

## 2015-07-28 ENCOUNTER — Encounter: Payer: Self-pay | Admitting: Obstetrics & Gynecology

## 2015-07-28 VITALS — BP 102/47 | HR 67 | Wt 204.9 lb

## 2015-07-28 DIAGNOSIS — O10913 Unspecified pre-existing hypertension complicating pregnancy, third trimester: Secondary | ICD-10-CM

## 2015-07-28 LAB — POCT URINALYSIS DIP (DEVICE)
Bilirubin Urine: NEGATIVE
GLUCOSE, UA: NEGATIVE mg/dL
Ketones, ur: NEGATIVE mg/dL
Nitrite: NEGATIVE
Protein, ur: NEGATIVE mg/dL
Specific Gravity, Urine: 1.02 (ref 1.005–1.030)
Urobilinogen, UA: 0.2 mg/dL (ref 0.0–1.0)
pH: 6.5 (ref 5.0–8.0)

## 2015-07-28 NOTE — Patient Instructions (Signed)
Tercer trimestre de embarazo (Third Trimester of Pregnancy) El tercer trimestre va desde la semana29 hasta la 42, desde el sptimo hasta el noveno mes, y es la poca en la que el feto crece ms rpidamente. Hacia el final del noveno mes, el feto mide alrededor de 20pulgadas (45cm) de largo y pesa entre 6 y 10 libras (2,700 y 4,500kg).  CAMBIOS EN EL ORGANISMO Su organismo atraviesa por muchos cambios durante el embarazo, y estos varan de una mujer a otra.   Seguir aumentando de peso. Es de esperar que aumente entre 25 y 35libras (11 y 16kg) hacia el final del embarazo.  Podrn aparecer las primeras estras en las caderas, el abdomen y las mamas.  Puede tener necesidad de orinar con ms frecuencia porque el feto baja hacia la pelvis y ejerce presin sobre la vejiga.  Debido al embarazo podr sentir acidez estomacal con frecuencia.  Puede estar estreida, ya que ciertas hormonas enlentecen los movimientos de los msculos que empujan los desechos a travs de los intestinos.  Pueden aparecer hemorroides o abultarse e hincharse las venas (venas varicosas).  Puede sentir dolor plvico debido al aumento de peso y a que las hormonas del embarazo relajan las articulaciones entre los huesos de la pelvis. El dolor de espalda puede ser consecuencia de la sobrecarga de los msculos que soportan la postura.  Tal vez haya cambios en el cabello que pueden incluir su engrosamiento, crecimiento rpido y cambios en la textura. Adems, a algunas mujeres se les cae el cabello durante o despus del embarazo, o tienen el cabello seco o fino. Lo ms probable es que el cabello se le normalice despus del nacimiento del beb.  Las mamas seguirn creciendo y le dolern. A veces, puede haber una secrecin amarilla de las mamas llamada calostro.  El ombligo puede salir hacia afuera.  Puede sentir que le falta el aire debido a que se expande el tero.  Puede notar que el feto "baja" o lo siente ms bajo, en el  abdomen.  Puede tener una prdida de secrecin mucosa con sangre. Esto suele ocurrir en el trmino de unos pocos das a una semana antes de que comience el trabajo de parto.  El cuello del tero se vuelve delgado y blando (se borra) cerca de la fecha de parto. QU DEBE ESPERAR EN LOS EXMENES PRENATALES  Le harn exmenes prenatales cada 2semanas hasta la semana36. A partir de ese momento le harn exmenes semanales. Durante una visita prenatal de rutina:  La pesarn para asegurarse de que usted y el feto estn creciendo normalmente.  Le tomarn la presin arterial.  Le medirn el abdomen para controlar el desarrollo del beb.  Se escucharn los latidos cardacos fetales.  Se evaluarn los resultados de los estudios solicitados en visitas anteriores.  Le revisarn el cuello del tero cuando est prxima la fecha de parto para controlar si este se ha borrado. Alrededor de la semana36, el mdico le revisar el cuello del tero. Al mismo tiempo, realizar un anlisis de las secreciones del tejido vaginal. Este examen es para determinar si hay un tipo de bacteria, estreptococo Grupo B. El mdico le explicar esto con ms detalle. El mdico puede preguntarle lo siguiente:  Cmo le gustara que fuera el parto.  Cmo se siente.  Si siente los movimientos del beb.  Si ha tenido sntomas anormales, como prdida de lquido, sangrado, dolores de cabeza intensos o clicos abdominales.  Si tiene alguna pregunta. Otros exmenes o estudios de deteccin que pueden realizarse   durante el tercer trimestre incluyen lo siguiente:  Anlisis de sangre para controlar las concentraciones de hierro (anemia).  Controles fetales para determinar su salud, nivel de actividad y crecimiento. Si tiene alguna enfermedad o hay problemas durante el embarazo, le harn estudios. FALSO TRABAJO DE PARTO Es posible que sienta contracciones leves e irregulares que finalmente desaparecen. Se llaman contracciones de  Braxton Hicks o falso trabajo de parto. Las contracciones pueden durar horas, das o incluso semanas, antes de que el verdadero trabajo de parto se inicie. Si las contracciones ocurren a intervalos regulares, se intensifican o se hacen dolorosas, lo mejor es que la revise el mdico.  SIGNOS DE TRABAJO DE PARTO   Clicos de tipo menstrual.  Contracciones cada 5minutos o menos.  Contracciones que comienzan en la parte superior del tero y se extienden hacia abajo, a la zona inferior del abdomen y la espalda.  Sensacin de mayor presin en la pelvis o dolor de espalda.  Una secrecin de mucosidad acuosa o con sangre que sale de la vagina. Si tiene alguno de estos signos antes de la semana37 del embarazo, llame a su mdico de inmediato. Debe concurrir al hospital para que la controlen inmediatamente. INSTRUCCIONES PARA EL CUIDADO EN EL HOGAR   Evite fumar, consumir hierbas, beber alcohol y tomar frmacos que no le hayan recetado. Estas sustancias qumicas afectan la formacin y el desarrollo del beb.  Siga las indicaciones del mdico en relacin con el uso de medicamentos. Durante el embarazo, hay medicamentos que son seguros de tomar y otros que no.  Haga actividad fsica solo en la forma indicada por el mdico. Sentir clicos uterinos es un buen signo para detener la actividad fsica.  Contine comiendo alimentos que sanos con regularidad.  Use un sostn que le brinde buen soporte si le duelen las mamas.  No se d baos de inmersin en agua caliente, baos turcos ni saunas.  Colquese el cinturn de seguridad cuando conduzca.  No coma carne cruda ni queso sin cocinar; evite el contacto con las bandejas sanitarias de los gatos y la tierra que estos animales usan. Estos elementos contienen grmenes que pueden causar defectos congnitos en el beb.  Tome las vitaminas prenatales.  Si est estreida, pruebe un laxante suave (si el mdico lo autoriza). Consuma ms alimentos ricos en  fibra, como vegetales y frutas frescos y cereales integrales. Beba gran cantidad de lquido para mantener la orina de tono claro o color amarillo plido.  Dese baos de asiento con agua tibia para aliviar el dolor o las molestias causadas por las hemorroides. Use una crema para las hemorroides si el mdico la autoriza.  Si tiene venas varicosas, use medias de descanso. Eleve los pies durante 15minutos, 3 o 4veces por da. Limite la cantidad de sal en su dieta.  Evite levantar objetos pesados, use zapatos de tacones bajos y mantenga una buena postura.  Descanse con las piernas elevadas si tiene calambres o dolor de cintura.  Visite a su dentista si no lo ha hecho durante el embarazo. Use un cepillo de dientes blando para higienizarse los dientes y psese el hilo dental con suavidad.  Puede seguir manteniendo relaciones sexuales, a menos que el mdico le indique lo contrario.  No haga viajes largos excepto que sea absolutamente necesario y solo con la autorizacin del mdico.  Tome clases prenatales para entender, practicar y hacer preguntas sobre el trabajo de parto y el parto.  Haga un ensayo de la partida al hospital.  Prepare el bolso que   llevar al hospital.  Prepare la habitacin del beb.  Concurra a todas las visitas prenatales segn las indicaciones de su mdico. SOLICITE ATENCIN MDICA SI:  No est segura de que est en trabajo de parto o de que ha roto la bolsa de las aguas.  Tiene mareos.  Siente clicos leves, presin en la pelvis o dolor persistente en el abdomen.  Tiene nuseas, vmitos o diarrea persistentes.  Tiene secrecin vaginal con mal olor.  Siente dolor al orinar. SOLICITE ATENCIN MDICA DE INMEDIATO SI:   Tiene fiebre.  Tiene una prdida de lquido por la vagina.  Tiene sangrado o pequeas prdidas vaginales.  Siente dolor intenso o clicos en el abdomen.  Sube o baja de peso rpidamente.  Tiene dificultad para respirar y siente dolor de  pecho.  Sbitamente se le hinchan mucho el rostro, las manos, los tobillos, los pies o las piernas.  No ha sentido los movimientos del beb durante una hora.  Siente un dolor de cabeza intenso que no se alivia con medicamentos.  Hay cambios en la visin. Document Released: 08/08/2005 Document Revised: 11/03/2013 [employer] Patient Information 2015 [employer]. This information is not intended to replace advice given to you by your health care provider. Make sure you discuss any questions you have with your health care provider.  

## 2015-07-28 NOTE — Progress Notes (Signed)
NST reactive today  Subjective:  Bonnie Morales is a 34 y.o. G2P0101 at [redacted]w[redacted]d being seen today for ongoing prenatal care.  Patient reports no complaints.  Contractions: Not present.  Vag. Bleeding: None. Movement: Present. Denies leaking of fluid.   The following portions of the patient's history were reviewed and updated as appropriate: allergies, current medications, past family history, past medical history, past social history, past surgical history and problem list.   Objective:   Filed Vitals:   07/28/15 0824  BP: 102/47  Pulse: 67  Weight: 204 lb 14.4 oz (92.942 kg)    Fetal Status:     Movement: Present  Presentation: Vertex  General:  Alert, oriented and cooperative. Patient is in no acute distress.  Skin: Skin is warm and dry. No rash noted.   Cardiovascular: Normal heart rate noted  Respiratory: Normal respiratory effort, no problems with respiration noted  Abdomen: Soft, gravid, appropriate for gestational age. Pain/Pressure: Present     Pelvic: Vag. Bleeding: None     Cervical exam deferred        Extremities: Normal range of motion.  Edema: None  Mental Status: Normal mood and affect. Normal behavior. Normal judgment and thought content.   Urinalysis:      Assessment and Plan:  Pregnancy: G2P0101 at [redacted]w[redacted]d  1. Pre-existing hypertension complicating pregnancy, third trimester   Preterm labor symptoms and general obstetric precautions including but not limited to vaginal bleeding, contractions, leaking of fluid and fetal movement were reviewed in detail with the patient. Please refer to After Visit Summary for other counseling recommendations.  Return in about 4 days (around 08/01/2015) for 2x/wk as scheduled.   Woodroe Mode, MD

## 2015-07-28 NOTE — Progress Notes (Signed)
Interpreter Anastasio Auerbach present for encounter. Breastfeeding tip of the week reviewed.

## 2015-08-01 ENCOUNTER — Ambulatory Visit (INDEPENDENT_AMBULATORY_CARE_PROVIDER_SITE_OTHER): Payer: Self-pay | Admitting: Advanced Practice Midwife

## 2015-08-01 VITALS — BP 123/61 | HR 76

## 2015-08-01 DIAGNOSIS — O10913 Unspecified pre-existing hypertension complicating pregnancy, third trimester: Secondary | ICD-10-CM

## 2015-08-01 NOTE — Progress Notes (Signed)
Pt reports 2 episodes of clear fluid leaking, the first was 9/16 and the second was yesterday.

## 2015-08-01 NOTE — Progress Notes (Signed)
NST reactive.

## 2015-08-04 ENCOUNTER — Ambulatory Visit (INDEPENDENT_AMBULATORY_CARE_PROVIDER_SITE_OTHER): Payer: Self-pay | Admitting: Obstetrics & Gynecology

## 2015-08-04 VITALS — BP 106/60 | HR 62 | Wt 206.3 lb

## 2015-08-04 DIAGNOSIS — O10913 Unspecified pre-existing hypertension complicating pregnancy, third trimester: Secondary | ICD-10-CM

## 2015-08-04 DIAGNOSIS — O0993 Supervision of high risk pregnancy, unspecified, third trimester: Secondary | ICD-10-CM

## 2015-08-04 DIAGNOSIS — O3421 Maternal care for scar from previous cesarean delivery: Secondary | ICD-10-CM

## 2015-08-04 DIAGNOSIS — O34219 Maternal care for unspecified type scar from previous cesarean delivery: Secondary | ICD-10-CM

## 2015-08-04 LAB — POCT URINALYSIS DIP (DEVICE)
Bilirubin Urine: NEGATIVE
GLUCOSE, UA: NEGATIVE mg/dL
Hgb urine dipstick: NEGATIVE
KETONES UR: NEGATIVE mg/dL
Nitrite: NEGATIVE
Protein, ur: NEGATIVE mg/dL
SPECIFIC GRAVITY, URINE: 1.025 (ref 1.005–1.030)
Urobilinogen, UA: 0.2 mg/dL (ref 0.0–1.0)
pH: 5.5 (ref 5.0–8.0)

## 2015-08-04 NOTE — Progress Notes (Signed)
Interpreter Marly Adams present for encounter 

## 2015-08-04 NOTE — Progress Notes (Signed)
Subjective:  Bonnie Morales is a 34 y.o. G2P0101 at [redacted]w[redacted]d being seen today for ongoing prenatal care. Patient is Spanish-speaking only, Spanish interpreter present for this encounter.  Patient reports no complaints.  Contractions: Not present.   . Movement: Present. Denies leaking of fluid.   The following portions of the patient's history were reviewed and updated as appropriate: allergies, current medications, past family history, past medical history, past social history, past surgical history and problem list.   Objective:   Filed Vitals:   08/04/15 0852  BP: 106/60  Pulse: 62  Weight: 206 lb 4.8 oz (93.577 kg)    Fetal Status: Fetal Heart Rate (bpm): RNST Fundal Height: 35 cm Movement: Present  Presentation: Vertex  General:  Alert, oriented and cooperative. Patient is in no acute distress.  Skin: Skin is warm and dry. No rash noted.   Cardiovascular: Normal heart rate noted  Respiratory: Normal respiratory effort, no problems with respiration noted  Abdomen: Soft, gravid, appropriate for gestational age. Pain/Pressure: Present     Pelvic:       Cervical exam deferred        Extremities: Normal range of motion.  Edema: None  Mental Status: Normal mood and affect. Normal behavior. Normal judgment and thought content.   Urinalysis: Urine Protein: Negative Urine Glucose: Negative  NST performed today was reviewed and was found to be reactive.  AFI normal at 15.6 cm.  Continue recommended antenatal testing and prenatal care.  Assessment and Plan:  Pregnancy: G2P0101 at [redacted]w[redacted]d  1. Pre-existing hypertension complicating pregnancy in third trimester Reassuring testing. Stable BP, continue Labetalol.  Has not been on ASA therapy, there is unproven utility to start it now at [redacted]w[redacted]d.  2. Previous cesarean delivery affecting pregnancy, antepartum Surgical order sent it to schedule RCS at 39 weeks.  3. Supervision of high risk pregnancy, antepartum, third trimester Preterm labor  symptoms and general obstetric precautions including but not limited to vaginal bleeding, contractions, leaking of fluid and fetal movement were reviewed in detail with the patient. Please refer to After Visit Summary for other counseling recommendations.  Return in about 4 days (around 08/08/2015) for 2x/wk as scheduled. Pelvic cultures next visit.   Osborne Oman, MD

## 2015-08-04 NOTE — Patient Instructions (Signed)
Regrese a la clinica cuando tenga su cita. Si tiene problemas o preguntas, llama a la clinica o vaya a la sala de emergencia al [facility].    

## 2015-08-08 ENCOUNTER — Ambulatory Visit (INDEPENDENT_AMBULATORY_CARE_PROVIDER_SITE_OTHER): Payer: Self-pay | Admitting: *Deleted

## 2015-08-08 ENCOUNTER — Encounter (HOSPITAL_COMMUNITY): Payer: Self-pay | Admitting: *Deleted

## 2015-08-08 VITALS — BP 118/61 | HR 58

## 2015-08-08 DIAGNOSIS — O10913 Unspecified pre-existing hypertension complicating pregnancy, third trimester: Secondary | ICD-10-CM

## 2015-08-08 NOTE — Progress Notes (Signed)
NSt reactive 

## 2015-08-11 ENCOUNTER — Other Ambulatory Visit: Payer: Self-pay

## 2015-08-15 ENCOUNTER — Ambulatory Visit (INDEPENDENT_AMBULATORY_CARE_PROVIDER_SITE_OTHER): Payer: Medicaid Other | Admitting: *Deleted

## 2015-08-15 VITALS — BP 125/75 | HR 123 | Wt 207.8 lb

## 2015-08-15 DIAGNOSIS — O10913 Unspecified pre-existing hypertension complicating pregnancy, third trimester: Secondary | ICD-10-CM

## 2015-08-15 NOTE — Progress Notes (Signed)
Used interpreter Leavy Cella.

## 2015-08-17 NOTE — Progress Notes (Signed)
NST reactive.

## 2015-08-18 ENCOUNTER — Ambulatory Visit (INDEPENDENT_AMBULATORY_CARE_PROVIDER_SITE_OTHER): Payer: Medicaid Other | Admitting: Obstetrics & Gynecology

## 2015-08-18 ENCOUNTER — Other Ambulatory Visit: Payer: Self-pay | Admitting: Family Medicine

## 2015-08-18 ENCOUNTER — Ambulatory Visit (HOSPITAL_COMMUNITY)
Admission: RE | Admit: 2015-08-18 | Discharge: 2015-08-18 | Disposition: A | Discharge status: Home / Self Care | Payer: Self-pay | Source: Ambulatory Visit | Attending: Family Medicine | Admitting: Family Medicine

## 2015-08-18 VITALS — BP 127/64 | HR 60 | Wt 210.0 lb

## 2015-08-18 DIAGNOSIS — O09293 Supervision of pregnancy with other poor reproductive or obstetric history, third trimester: Secondary | ICD-10-CM

## 2015-08-18 DIAGNOSIS — Z113 Encounter for screening for infections with a predominantly sexual mode of transmission: Secondary | ICD-10-CM

## 2015-08-18 DIAGNOSIS — O10913 Unspecified pre-existing hypertension complicating pregnancy, third trimester: Secondary | ICD-10-CM | POA: Diagnosis not present

## 2015-08-18 DIAGNOSIS — O99213 Obesity complicating pregnancy, third trimester: Secondary | ICD-10-CM | POA: Insufficient documentation

## 2015-08-18 DIAGNOSIS — Z3A37 37 weeks gestation of pregnancy: Secondary | ICD-10-CM

## 2015-08-18 DIAGNOSIS — E669 Obesity, unspecified: Secondary | ICD-10-CM | POA: Insufficient documentation

## 2015-08-18 DIAGNOSIS — O34219 Maternal care for unspecified type scar from previous cesarean delivery: Secondary | ICD-10-CM | POA: Insufficient documentation

## 2015-08-18 LAB — POCT URINALYSIS DIP (DEVICE)
Bilirubin Urine: NEGATIVE
Glucose, UA: NEGATIVE mg/dL
Hgb urine dipstick: NEGATIVE
Ketones, ur: NEGATIVE mg/dL
Nitrite: NEGATIVE
PH: 5.5 (ref 5.0–8.0)
Protein, ur: NEGATIVE mg/dL
SPECIFIC GRAVITY, URINE: 1.02 (ref 1.005–1.030)
Urobilinogen, UA: 0.2 mg/dL (ref 0.0–1.0)

## 2015-08-18 LAB — OB RESULTS CONSOLE GBS: STREP GROUP B AG: POSITIVE

## 2015-08-18 NOTE — Progress Notes (Signed)
NST reactive  Subjective:  Bonnie Morales is a 34 y.o. G2P0101 at [redacted]w[redacted]d being seen today for ongoing prenatal care.  Patient reports no complaints.  Contractions: Not present.  Vag. Bleeding: None. Movement: Present. Denies leaking of fluid.   The following portions of the patient's history were reviewed and updated as appropriate: allergies, current medications, past family history, past medical history, past social history, past surgical history and problem list.   Objective:   Filed Vitals:   08/18/15 1352  BP: 127/64  Pulse: 60  Weight: 210 lb (95.255 kg)    Fetal Status: Fetal Heart Rate (bpm): nst   Movement: Present     General:  Alert, oriented and cooperative. Patient is in no acute distress.  Skin: Skin is warm and dry. No rash noted.   Cardiovascular: Normal heart rate noted  Respiratory: Normal respiratory effort, no problems with respiration noted  Abdomen: Soft, gravid, appropriate for gestational age. Pain/Pressure: Present     Pelvic: Vag. Bleeding: None     Cervical exam deferred        Extremities: Normal range of motion.  Edema: None  Mental Status: Normal mood and affect. Normal behavior. Normal judgment and thought content.   Urinalysis: Urine Protein: Negative Urine Glucose: Negative  Assessment and Plan:  Pregnancy: G2P0101 at [redacted]w[redacted]d  1. Preexisting hypertension complicating pregnancy, antepartum, third trimester Good control on Labetalol - Fetal nonstress test reactive - Culture, beta strep (group b only) - GC/Chlamydia probe amp ([place])not at [facility]  Term labor symptoms and general obstetric precautions including but not limited to vaginal bleeding, contractions, leaking of fluid and fetal movement were reviewed in detail with the patient. Please refer to After Visit Summary for other counseling recommendations.  Return in about 1 week (around 08/25/2015).   Woodroe Mode, MD

## 2015-08-18 NOTE — Progress Notes (Signed)
Alis used for interpreter

## 2015-08-19 LAB — GC/CHLAMYDIA PROBE AMP (~~LOC~~) NOT AT ARMC
CHLAMYDIA, DNA PROBE: NEGATIVE
NEISSERIA GONORRHEA: NEGATIVE

## 2015-08-20 LAB — CULTURE, BETA STREP (GROUP B ONLY)

## 2015-08-22 ENCOUNTER — Other Ambulatory Visit: Payer: Self-pay

## 2015-08-24 ENCOUNTER — Other Ambulatory Visit: Payer: Self-pay | Admitting: Obstetrics and Gynecology

## 2015-08-25 ENCOUNTER — Ambulatory Visit (INDEPENDENT_AMBULATORY_CARE_PROVIDER_SITE_OTHER): Payer: Medicaid Other | Admitting: Family Medicine

## 2015-08-25 VITALS — BP 116/66 | HR 71 | Wt 208.7 lb

## 2015-08-25 DIAGNOSIS — O0993 Supervision of high risk pregnancy, unspecified, third trimester: Secondary | ICD-10-CM

## 2015-08-25 DIAGNOSIS — O10913 Unspecified pre-existing hypertension complicating pregnancy, third trimester: Secondary | ICD-10-CM | POA: Diagnosis present

## 2015-08-25 DIAGNOSIS — O34219 Maternal care for unspecified type scar from previous cesarean delivery: Secondary | ICD-10-CM

## 2015-08-25 LAB — POCT URINALYSIS DIP (DEVICE)
Bilirubin Urine: NEGATIVE
Glucose, UA: NEGATIVE mg/dL
KETONES UR: NEGATIVE mg/dL
Nitrite: NEGATIVE
PH: 6 (ref 5.0–8.0)
PROTEIN: NEGATIVE mg/dL
SPECIFIC GRAVITY, URINE: 1.025 (ref 1.005–1.030)
Urobilinogen, UA: 0.2 mg/dL (ref 0.0–1.0)

## 2015-08-25 NOTE — Patient Instructions (Signed)
Tercer trimestre de Media planner (Third Trimester of Pregnancy) El tercer trimestre comprende desde la [id number] hasta la [id number], es decir, desde el mes7 hasta el mes9. El tercer trimestre es un perodo en el que el feto crece rpidamente. Hacia el final del noveno mes, el feto mide alrededor de 20pulgadas (45cm) de largo y pesa entre 6 y 9 libras (2,700 y 80,500kg).  CAMBIOS EN EL ORGANISMO Su organismo atraviesa por muchos cambios durante el [place], y estos varan de Ardelia Mems mujer a Theatre manager.   Seguir [employer]. Es de esperar que aumente entre 25 y 35libras (37 y 16kg) hacia el final del Media planner.  Podrn aparecer las primeras [employer] caderas, el abdomen y las [place].  Puede tener necesidad de Garment/textile technologist con ms frecuencia porque el feto baja hacia la pelvis y ejerce presin sobre la vejiga.  Debido al Glennis Brink podr sentir Victorio Palm estomacal con frecuencia.  Puede estar estreida, ya que ciertas hormonas enlentecen los movimientos de los msculos que [employer] desechos a travs de los intestinos.  Pueden aparecer hemorroides o abultarse e hincharse las venas (venas varicosas).  Puede sentir dolor plvico debido al [employer] y a que las hormonas del Scientist, research (life sciences) las articulaciones entre los huesos de la pelvis. El dolor de espalda puede ser consecuencia de la sobrecarga de los msculos que soportan la [place].  Tal vez haya cambios en el cabello que pueden incluir su engrosamiento, crecimiento rpido y cambios en la textura. Adems, a algunas mujeres se les cae el cabello durante o despus del embarazo, o tienen el cabello seco o fino. Lo ms probable es que el cabello se le normalice despus del nacimiento del beb.  Las [employer] seguirn creciendo y Teaching laboratory technician. A veces, puede haber una secrecin amarilla de las mamas llamada calostro.  El ombligo puede salir hacia afuera.  Puede sentir que le falta el aire debido a que se expande el tero.  Puede notar que el feto  "baja" o lo siente ms bajo, en el abdomen.  Puede tener una prdida de secrecin mucosa con sangre. Esto suele ocurrir en el trmino de unos pocos das a una semana antes de que comience el [place].  El cuello del tero se vuelve delgado y blando (se borra) cerca de la fecha de [place]. QU DEBE ESPERAR EN LOS EXMENES PRENATALES  Le harn exmenes prenatales cada 2semanas hasta la semana36. A partir de ese momento le harn exmenes semanales. Durante una visita prenatal de rutina:  La pesarn para asegurarse de que usted y el feto estn creciendo normalmente.  Le tomarn la presin arterial.  Le medirn el abdomen para controlar el desarrollo del beb.  Se escucharn los latidos cardacos fetales.  Se evaluarn los resultados de los estudios solicitados en visitas anteriores.  Le revisarn el cuello del tero cuando est prxima la fecha de parto para controlar si este se ha borrado. Alrededor de la semana36, el mdico le revisar el cuello del tero. Al mismo tiempo, realizar un anlisis de las secreciones del tejido vaginal. Este examen es para determinar si hay un tipo de bacteria, estreptococo Grupo B. El mdico le explicar esto con ms detalle. El mdico puede preguntarle lo siguiente:  Cmo le gustara que fuera el [place].  Cmo se siente.  Si siente los movimientos del beb.  Si ha tenido sntomas anormales, como prdida de lquido, [place], dolores de cabeza intensos o clicos abdominales.  Si est consumiendo algn producto que contenga tabaco, como cigarrillos, tabaco  de Higher education careers adviser y Psychologist, sport and exercise.  Si tiene [employer]. Otros exmenes o estudios de deteccin que pueden realizarse durante el tercer trimestre incluyen lo siguiente:  Anlisis de sangre para controlar los niveles de hierro (anemia).  Controles fetales para determinar su salud, nivel de [place] y Mining engineer. Si tiene [place] enfermedad o hay problemas durante el embarazo, le harn  estudios.  Prueba del VIH (virus de inmunodeficiencia humana). Si corre Electronics engineer, pueden realizarle una prueba de deteccin del VIH durante el tercer trimestre del embarazo. FALSO TRABAJO DE PARTO Es posible que sienta contracciones leves e irregulares que finalmente desaparecen. Se llaman contracciones de Braxton Hicks o falso trabajo de [place]. Las Yahoo pueden durar horas, das o incluso semanas, antes de que el verdadero trabajo de parto se inicie. Si las contracciones ocurren a intervalos regulares, se intensifican o se hacen dolorosas, lo mejor es que la revise el mdico.  SIGNOS DE TRABAJO DE PARTO   Clicos de tipo menstrual.  Contracciones cada 48mnutos o menos.  Contracciones que comienzan en la parte superior del tero y se extienden hacia abajo, a la zona inferior del abdomen y la espalda.  Sensacin de mayor presin en la pelvis o dolor de espalda.  Una secrecin de mucosidad acuosa o con sangre que sale de la vagina. Si tiene alguno de estos signos antes de la [id number] eMedia planner llame a su mdico de inmediato. Debe concurrir al hospital para que la controlen inmediatamente. INSTRUCCIONES PARA EL CUIDADO EN EL HOGAR   Evite fumar, consumir hierbas, beber alcohol y tomar frmacos que no le hayan recetado. Estas sustancias qumicas afectan la formacin y el desarrollo del beb.  No consuma ningn producto que contenga tabaco, lo que incluye cigarrillos, tabaco de mHigher education careers advisery cPsychologist, sport and exercise Si necesita ayuda para dejar de fumar, consulte al mMeadWestvaco Puede recibir asesoramiento y otro tipo de recursos para dejar de fumar.  [place]mdico en relacin con el uso de medicamentos. Durante el embarazo, hay medicamentos que son seguros de tomar y otros que no.  Haga ejercicio solamente como se lo haya indicado el mdico. Sentir clicos uterinos es un buen signo para dAmbulance personactividad fsica.  Contine comiendo alimentos sanos con  regularidad.  Use un sostn que le brinde buen soporte si le dNordstrom  No se d baos de inmersin en agua caliente, baos turcos ni saunas.  Use el cinturn de seguridad en todo momento mientras conduce.  No coma carne cruda ni queso sin cocinar; evite el contacto con las bandejas sanitarias de los gatos y la tierra que estos animales usan. Estos elementos contienen grmenes que pueden causar defectos congnitos en el beb.  TSebastian  Tome entre 1500 y 20017mde calcio diariamente comenzando en la [id number] embarazo haConvoy Si est estreida, pruebe un laxante suave (si el mdico lo autoriza). Consuma ms alimentos ricos en fibra, como vegetales y frutas frescos y cePsychologist, prison and probation servicesBeba gran cantidad de lquido para mantener la orina de tono claro o color amarillo plido.  Dese baos de asiento con agua tibia para alBest boy las molestias causadas por las hemorroides. Use una crema para las hemorroides si el mdico la autoriza.  Si tiene venas varicosas, use medias de descanso. Eleve los pies durante 1518mtos, 3 o 4veces por da. Limite el consumo de sal en su dieta.  Evite levantar objetos pesados, use zapatos de tacones bajos y manWestern SaharaDescanse  con las piernas elevadas si tiene calambres o dolor de cintura.  Visite a su dentista si no lo ha Quarry manager. Use un cepillo de dientes blando para higienizarse los dientes y psese el hilo dental con suavidad.  Puede seguir [employer], a menos que el mdico le indique lo contrario.  No haga viajes largos excepto que sea absolutamente necesario y solo con la autorizacin del [place] clases prenatales para Development worker, international aid, Psychologist, prison and probation services y hacer preguntas sobre el [place] de parto y [place].  Haga un ensayo de la partida al hospital.  Prepare el bolso que llevar al hospital.  Prepare la habitacin del beb.  Concurra a todas  las visitas prenatales segn las indicaciones de su mdico. SOLICITE ATENCIN MDICA SI:  No est segura de que est en trabajo de parto o de que ha roto la bolsa de las aguas.  Tiene mareos.  Siente clicos leves, presin en la pelvis o dolor persistente en el abdomen.  Tiene nuseas, vmitos o diarrea persistentes.  Margette Fast secrecin vaginal con mal olor.  Siente dolor al [employer]. SOLICITE ATENCIN MDICA DE INMEDIATO SI:   Tiene fiebre.  Tiene una prdida de lquido por la vagina.  Tiene sangrado o pequeas prdidas vaginales.  Siente dolor intenso o clicos en el abdomen.  Sube o baja de peso rpidamente.  Tiene dificultad para respirar y siente dolor de pecho.  Sbitamente se le hinchan mucho el rostro, las [place], los tobillos, los pies o las piernas.  No ha sentido los movimientos del beb durante Leone Brand.  Siente un dolor de cabeza intenso que no se alivia con medicamentos.  Su visin se modifica.   Esta informacin no tiene Marine scientist el consejo del mdico. Asegrese de hacerle al mdico cualquier pregunta que tenga.   Document Released: 08/08/2005 Document Revised: 11/19/2014 Elsevier Interactive Patient Education [employer].

## 2015-08-25 NOTE — Progress Notes (Signed)
Subjective:  Bonnie Morales is a 34 y.o. G2P0101 at [redacted]w[redacted]d being seen today for ongoing prenatal care.  Patient reports no complaints.  Contractions: Not present.  Vag. Bleeding: None. Movement: Present. Denies leaking of fluid.   The following portions of the patient's history were reviewed and updated as appropriate: allergies, current medications, past family history, past medical history, past social history, past surgical history and problem list. Problem list updated.  Objective:   Filed Vitals:   08/25/15 0955  BP: 116/66  Pulse: 71  Weight: 208 lb 11.2 oz (94.666 kg)    Fetal Status: Fetal Heart Rate (bpm): NST   Movement: Present     General:  Alert, oriented and cooperative. Patient is in no acute distress.  Skin: Skin is warm and dry. No rash noted.   Cardiovascular: Normal heart rate noted  Respiratory: Normal respiratory effort, no problems with respiration noted  Abdomen: Soft, gravid, appropriate for gestational age. Pain/Pressure: Present     Pelvic: Vag. Bleeding: None     Cervical exam deferred        Extremities: Normal range of motion.  Edema: None  Mental Status: Normal mood and affect. Normal behavior. Normal judgment and thought content.   Urinalysis:      Assessment and Plan:  Pregnancy: G2P0101 at [redacted]w[redacted]d  1. Supervision of high risk pregnancy, antepartum, third trimester NST reactive  2. Preexisting hypertension complicating pregnancy, antepartum, third trimester Bp controlled.  Continue labetalol - Amniotic fluid index with NST  3. Previous cesarean delivery affecting pregnancy, antepartum Repeat section scheduled.   Term labor symptoms and general obstetric precautions including but not limited to vaginal bleeding, contractions, leaking of fluid and fetal movement were reviewed in detail with the patient. Please refer to After Visit Summary for other counseling recommendations.  Return in about 6 weeks (around 10/06/2015) for PP visit.  C/S on  10/18.   Truett Mainland, DO

## 2015-08-25 NOTE — Progress Notes (Signed)
Interpreter Anastasio Auerbach present for encounter.  Breastfeeding tip of the week reviewed.  Rpt C/S scheduled on 10/18.

## 2015-08-26 NOTE — Patient Instructions (Signed)
Your procedure is scheduled on:  Tuesday, August 30, 2015  Enter through the Main Entrance of [facility] at:  7:30 A.M.  Pick up the phone at the desk and dial [phone].  Call this number if you have problems the morning of surgery: [phone].  Remember: Do NOT eat food or drink after:  MIDNIGHT TONIGHT Take these medicines the morning of surgery with a SIP OF WATER:  LABETOLOL  Do NOT wear jewelry (body piercing), metal hair clips/bobby pins,  or nail polish. Do NOT wear lotions, powders, or perfumes.  You may wear deoderant. Do NOT shave for 48 hours prior to surgery. Do NOT bring valuables to the hospital.  Leave suitcase in car.  After surgery it may be brought to your room.  For patients admitted to the hospital, checkout time is 11:00 AM the day of discharge.

## 2015-08-27 ENCOUNTER — Inpatient Hospital Stay (HOSPITAL_COMMUNITY)
Admission: AD | Admit: 2015-08-27 | Discharge: 2015-08-29 | DRG: 774 | Disposition: A | Discharge status: Home / Self Care | Payer: Medicaid Other | Source: Ambulatory Visit | Attending: Obstetrics & Gynecology | Admitting: Obstetrics & Gynecology

## 2015-08-27 ENCOUNTER — Encounter (HOSPITAL_COMMUNITY): Payer: Self-pay

## 2015-08-27 DIAGNOSIS — O09213 Supervision of pregnancy with history of pre-term labor, third trimester: Secondary | ICD-10-CM

## 2015-08-27 DIAGNOSIS — O09893 Supervision of other high risk pregnancies, third trimester: Secondary | ICD-10-CM

## 2015-08-27 DIAGNOSIS — IMO0001 Reserved for inherently not codable concepts without codable children: Secondary | ICD-10-CM

## 2015-08-27 DIAGNOSIS — Z3A38 38 weeks gestation of pregnancy: Secondary | ICD-10-CM

## 2015-08-27 DIAGNOSIS — O10913 Unspecified pre-existing hypertension complicating pregnancy, third trimester: Secondary | ICD-10-CM

## 2015-08-27 DIAGNOSIS — Z98891 History of uterine scar from previous surgery: Secondary | ICD-10-CM | POA: Diagnosis present

## 2015-08-27 DIAGNOSIS — O09899 Supervision of other high risk pregnancies, unspecified trimester: Secondary | ICD-10-CM

## 2015-08-27 DIAGNOSIS — O1092 Unspecified pre-existing hypertension complicating childbirth: Secondary | ICD-10-CM | POA: Diagnosis present

## 2015-08-27 DIAGNOSIS — Z833 Family history of diabetes mellitus: Secondary | ICD-10-CM

## 2015-08-27 DIAGNOSIS — O09219 Supervision of pregnancy with history of pre-term labor, unspecified trimester: Secondary | ICD-10-CM

## 2015-08-27 DIAGNOSIS — O99824 Streptococcus B carrier state complicating childbirth: Secondary | ICD-10-CM | POA: Diagnosis present

## 2015-08-27 DIAGNOSIS — O34219 Maternal care for unspecified type scar from previous cesarean delivery: Secondary | ICD-10-CM | POA: Diagnosis not present

## 2015-08-27 DIAGNOSIS — Z209 Contact with and (suspected) exposure to unspecified communicable disease: Secondary | ICD-10-CM | POA: Diagnosis present

## 2015-08-27 DIAGNOSIS — O10919 Unspecified pre-existing hypertension complicating pregnancy, unspecified trimester: Secondary | ICD-10-CM | POA: Diagnosis present

## 2015-08-27 DIAGNOSIS — O0990 Supervision of high risk pregnancy, unspecified, unspecified trimester: Secondary | ICD-10-CM

## 2015-08-27 DIAGNOSIS — Z87891 Personal history of nicotine dependence: Secondary | ICD-10-CM

## 2015-08-27 MED ORDER — ACETAMINOPHEN 325 MG PO TABS: 650.0000 mg | ORAL_TABLET | Freq: Once | ORAL | Status: DC

## 2015-08-27 NOTE — Progress Notes (Signed)
Assisted RN with interpretation of triage questions.  Spanish Interpreter

## 2015-08-27 NOTE — Progress Notes (Signed)
Assisted registration with interpretation of admission information. Spanish Interpreter

## 2015-08-27 NOTE — MAU Note (Signed)
THis am, went to BR and saw bright blood on tissue. Saw that 2 more times. After that started having pain in lower abd and vagina pain but no more bleeding. Taking Labetalol 200mg  BID for chronic HTN. For repeat C/S Tues at 0900

## 2015-08-28 ENCOUNTER — Inpatient Hospital Stay (HOSPITAL_COMMUNITY): Payer: Medicaid Other | Admitting: Anesthesiology

## 2015-08-28 ENCOUNTER — Encounter (HOSPITAL_COMMUNITY): Payer: Self-pay | Admitting: *Deleted

## 2015-08-28 DIAGNOSIS — O99824 Streptococcus B carrier state complicating childbirth: Secondary | ICD-10-CM | POA: Diagnosis present

## 2015-08-28 DIAGNOSIS — Z833 Family history of diabetes mellitus: Secondary | ICD-10-CM | POA: Diagnosis not present

## 2015-08-28 DIAGNOSIS — Z3A38 38 weeks gestation of pregnancy: Secondary | ICD-10-CM | POA: Diagnosis not present

## 2015-08-28 DIAGNOSIS — Z87891 Personal history of nicotine dependence: Secondary | ICD-10-CM | POA: Diagnosis not present

## 2015-08-28 DIAGNOSIS — O1092 Unspecified pre-existing hypertension complicating childbirth: Secondary | ICD-10-CM | POA: Diagnosis present

## 2015-08-28 DIAGNOSIS — O34219 Maternal care for unspecified type scar from previous cesarean delivery: Secondary | ICD-10-CM | POA: Diagnosis present

## 2015-08-28 DIAGNOSIS — O4693 Antepartum hemorrhage, unspecified, third trimester: Secondary | ICD-10-CM | POA: Diagnosis present

## 2015-08-28 LAB — COMPREHENSIVE METABOLIC PANEL
ALBUMIN: 3.1 g/dL — AB (ref 3.5–5.0)
ALT: 11 U/L — AB (ref 14–54)
AST: 21 U/L (ref 15–41)
Alkaline Phosphatase: 181 U/L — ABNORMAL HIGH (ref 38–126)
Anion gap: 7 (ref 5–15)
BUN: 11 mg/dL (ref 6–20)
CHLORIDE: 106 mmol/L (ref 101–111)
CO2: 21 mmol/L — ABNORMAL LOW (ref 22–32)
CREATININE: 0.48 mg/dL (ref 0.44–1.00)
Calcium: 9 mg/dL (ref 8.9–10.3)
GFR calc Af Amer: >60 mL/min (ref >60)
GLUCOSE: 85 mg/dL (ref 65–99)
POTASSIUM: 4.2 mmol/L (ref 3.5–5.1)
Sodium: 134 mmol/L — ABNORMAL LOW (ref 135–145)
Total Bilirubin: 0.4 mg/dL (ref 0.3–1.2)
Total Protein: 6.9 g/dL (ref 6.5–8.1)

## 2015-08-28 LAB — URINALYSIS, ROUTINE W REFLEX MICROSCOPIC
Bilirubin Urine: NEGATIVE
Glucose, UA: NEGATIVE mg/dL
Ketones, ur: NEGATIVE mg/dL
NITRITE: NEGATIVE
Protein, ur: NEGATIVE mg/dL
SPECIFIC GRAVITY, URINE: 1.015 (ref 1.005–1.030)
UROBILINOGEN UA: 0.2 mg/dL (ref 0.0–1.0)
pH: 6 (ref 5.0–8.0)

## 2015-08-28 LAB — URINE MICROSCOPIC-ADD ON

## 2015-08-28 LAB — CBC
HEMATOCRIT: 35.8 % — AB (ref 36.0–46.0)
Hemoglobin: 12.5 g/dL (ref 12.0–15.0)
MCH: 30.4 pg (ref 26.0–34.0)
MCHC: 34.9 g/dL (ref 30.0–36.0)
MCV: 87.1 fL (ref 78.0–100.0)
PLATELETS: 226 10*3/uL (ref 150–400)
RBC: 4.11 MIL/uL (ref 3.87–5.11)
RDW: 13.6 % (ref 11.5–15.5)
WBC: 8.2 10*3/uL (ref 4.0–10.5)

## 2015-08-28 LAB — PROTEIN / CREATININE RATIO, URINE
CREATININE, URINE: 88 mg/dL
Protein Creatinine Ratio: 0.07 mg/mg{Cre} (ref 0.00–0.15)
Total Protein, Urine: 6 mg/dL

## 2015-08-28 LAB — HIV ANTIBODY (ROUTINE TESTING W REFLEX): HIV Screen 4th Generation wRfx: NONREACTIVE

## 2015-08-28 LAB — TYPE AND SCREEN
ABO/RH(D): O POS
Antibody Screen: NEGATIVE

## 2015-08-28 LAB — ABO/RH: ABO/RH(D): O POS

## 2015-08-28 LAB — RPR: RPR: NONREACTIVE

## 2015-08-28 MED ORDER — LACTATED RINGERS IV SOLN
INTRAVENOUS | Status: DC
  Administered 2015-08-28: 01:00:00 via INTRAVENOUS

## 2015-08-28 MED ORDER — LACTATED RINGERS IV SOLN
500.0000 mL | INTRAVENOUS | Status: DC | PRN
  Administered 2015-08-28: 1000 mL via INTRAVENOUS

## 2015-08-28 MED ORDER — ACETAMINOPHEN 325 MG PO TABS: 650.0000 mg | ORAL_TABLET | ORAL | Status: DC | PRN

## 2015-08-28 MED ORDER — ZOLPIDEM TARTRATE 5 MG PO TABS: 5.0000 mg | ORAL_TABLET | Freq: Every evening | ORAL | Status: DC | PRN

## 2015-08-28 MED ORDER — ONDANSETRON HCL 4 MG/2ML IJ SOLN: 4.0000 mg | INTRAMUSCULAR | Status: DC | PRN

## 2015-08-28 MED ORDER — OXYCODONE-ACETAMINOPHEN 5-325 MG PO TABS: 2.0000 | ORAL_TABLET | ORAL | Status: DC | PRN

## 2015-08-28 MED ORDER — SODIUM CHLORIDE 0.9 % IV SOLN
2.0000 g | Freq: Once | INTRAVENOUS | Status: AC
  Administered 2015-08-28: 2 g via INTRAVENOUS
  Filled 2015-08-28: qty 2000

## 2015-08-28 MED ORDER — FENTANYL CITRATE (PF) 100 MCG/2ML IJ SOLN: 100.0000 ug | INTRAMUSCULAR | Status: DC | PRN

## 2015-08-28 MED ORDER — OXYTOCIN 40 UNITS IN LACTATED RINGERS INFUSION - SIMPLE MED
62.5000 mL/h | INTRAVENOUS | Status: DC
  Administered 2015-08-28: 62.5 mL/h via INTRAVENOUS
  Filled 2015-08-28: qty 1000

## 2015-08-28 MED ORDER — SODIUM CHLORIDE 0.9 % IJ SOLN: 3.0000 mL | INTRAMUSCULAR | Status: DC | PRN

## 2015-08-28 MED ORDER — EPHEDRINE 5 MG/ML INJ: 10.0000 mg | INTRAVENOUS | Status: DC | PRN

## 2015-08-28 MED ORDER — SODIUM CHLORIDE 0.9 % IV SOLN: 250.0000 mL | INTRAVENOUS | Status: DC | PRN

## 2015-08-28 MED ORDER — OXYCODONE-ACETAMINOPHEN 5-325 MG PO TABS: 1.0000 | ORAL_TABLET | ORAL | Status: DC | PRN

## 2015-08-28 MED ORDER — LIDOCAINE HCL (PF) 1 % IJ SOLN
INTRAMUSCULAR | Status: DC | PRN
  Administered 2015-08-28 (×2): 3 mL via EPIDURAL

## 2015-08-28 MED ORDER — SIMETHICONE 80 MG PO CHEW: 80.0000 mg | CHEWABLE_TABLET | ORAL | Status: DC | PRN

## 2015-08-28 MED ORDER — INFLUENZA VAC SPLIT QUAD 0.5 ML IM SUSY: 0.5000 mL | PREFILLED_SYRINGE | INTRAMUSCULAR | Status: DC

## 2015-08-28 MED ORDER — CITRIC ACID-SODIUM CITRATE 334-500 MG/5ML PO SOLN: 30.0000 mL | ORAL | Status: DC | PRN

## 2015-08-28 MED ORDER — SENNOSIDES-DOCUSATE SODIUM 8.6-50 MG PO TABS
2.0000 | ORAL_TABLET | ORAL | Status: DC
  Administered 2015-08-28: 2 via ORAL

## 2015-08-28 MED ORDER — TETANUS-DIPHTH-ACELL PERTUSSIS 5-2.5-18.5 LF-MCG/0.5 IM SUSP: 0.5000 mL | Freq: Once | INTRAMUSCULAR | Status: DC

## 2015-08-28 MED ORDER — DIPHENHYDRAMINE HCL 25 MG PO CAPS: 25.0000 mg | ORAL_CAPSULE | Freq: Four times a day (QID) | ORAL | Status: DC | PRN

## 2015-08-28 MED ORDER — WITCH HAZEL-GLYCERIN EX PADS: 1.0000 "application " | MEDICATED_PAD | CUTANEOUS | Status: DC | PRN

## 2015-08-28 MED ORDER — IBUPROFEN 600 MG PO TABS
600.0000 mg | ORAL_TABLET | Freq: Four times a day (QID) | ORAL | Status: DC
  Administered 2015-08-28 – 2015-08-29 (×6): 600 mg via ORAL
  Filled 2015-08-28 (×6): qty 1

## 2015-08-28 MED ORDER — DIBUCAINE 1 % RE OINT: 1.0000 "application " | TOPICAL_OINTMENT | RECTAL | Status: DC | PRN

## 2015-08-28 MED ORDER — HYDROXYZINE HCL 50 MG PO TABS: 50.0000 mg | ORAL_TABLET | Freq: Four times a day (QID) | ORAL | Status: DC | PRN

## 2015-08-28 MED ORDER — FENTANYL 2.5 MCG/ML BUPIVACAINE 1/10 % EPIDURAL INFUSION (WH - ANES)
14.0000 mL/h | INTRAMUSCULAR | Status: DC | PRN
  Administered 2015-08-28 (×2): 14 mL/h via EPIDURAL
  Filled 2015-08-28 (×2): qty 125

## 2015-08-28 MED ORDER — ONDANSETRON HCL 4 MG PO TABS: 4.0000 mg | ORAL_TABLET | ORAL | Status: DC | PRN

## 2015-08-28 MED ORDER — ONDANSETRON HCL 4 MG/2ML IJ SOLN: 4.0000 mg | Freq: Four times a day (QID) | INTRAMUSCULAR | Status: DC | PRN

## 2015-08-28 MED ORDER — LANOLIN HYDROUS EX OINT: TOPICAL_OINTMENT | CUTANEOUS | Status: DC | PRN

## 2015-08-28 MED ORDER — OXYTOCIN 40 UNITS IN LACTATED RINGERS INFUSION - SIMPLE MED: 62.5000 mL/h | INTRAVENOUS | Status: DC | PRN

## 2015-08-28 MED ORDER — OXYTOCIN BOLUS FROM INFUSION
500.0000 mL | INTRAVENOUS | Status: DC
  Administered 2015-08-28: 500 mL via INTRAVENOUS

## 2015-08-28 MED ORDER — SODIUM CHLORIDE 0.9 % IV SOLN
1.0000 g | INTRAVENOUS | Status: DC
  Administered 2015-08-28: 1 g via INTRAVENOUS
  Filled 2015-08-28 (×4): qty 1000

## 2015-08-28 MED ORDER — PRENATAL MULTIVITAMIN CH
1.0000 | ORAL_TABLET | Freq: Every day | ORAL | Status: DC
  Administered 2015-08-28 – 2015-08-29 (×2): 1 via ORAL
  Filled 2015-08-28 (×2): qty 1

## 2015-08-28 MED ORDER — SODIUM BICARBONATE 8.4 % IV SOLN
INTRAVENOUS | Status: DC | PRN
  Administered 2015-08-28 (×2): 5 mL via EPIDURAL

## 2015-08-28 MED ORDER — DIPHENHYDRAMINE HCL 50 MG/ML IJ SOLN: 12.5000 mg | INTRAMUSCULAR | Status: DC | PRN

## 2015-08-28 MED ORDER — LIDOCAINE HCL (PF) 1 % IJ SOLN: 30.0000 mL | INTRAMUSCULAR | Status: DC | PRN

## 2015-08-28 MED ORDER — PHENYLEPHRINE 40 MCG/ML (10ML) SYRINGE FOR IV PUSH (FOR BLOOD PRESSURE SUPPORT)
80.0000 ug | PREFILLED_SYRINGE | INTRAVENOUS | Status: DC | PRN
  Filled 2015-08-28: qty 20

## 2015-08-28 MED ORDER — FLEET ENEMA 7-19 GM/118ML RE ENEM: 1.0000 | ENEMA | RECTAL | Status: DC | PRN

## 2015-08-28 MED ORDER — BENZOCAINE-MENTHOL 20-0.5 % EX AERO
1.0000 "application " | INHALATION_SPRAY | CUTANEOUS | Status: DC | PRN
  Administered 2015-08-28: 1 via TOPICAL
  Filled 2015-08-28: qty 56

## 2015-08-28 MED ORDER — SODIUM CHLORIDE 0.9 % IJ SOLN: 3.0000 mL | Freq: Two times a day (BID) | INTRAMUSCULAR | Status: DC

## 2015-08-28 NOTE — Progress Notes (Signed)
Continuous FHR surveillance interrupted, RN at bedside, EFM adjusted.

## 2015-08-28 NOTE — Progress Notes (Signed)
Assisted Dr Nevada Crane with interpretation of patient assessment.  Spanish Interpreter

## 2015-08-28 NOTE — Progress Notes (Signed)
Dr. Smith Robert of anesthesia to bedside, pain assessed and epidural dosed.

## 2015-08-28 NOTE — H&P (Signed)
Bonnie Morales is a 34 y.o. G2P0101 female at [redacted]w[redacted]d by LMP c/w 21wk u/s, presenting w/ report of vag bleeding and headache w/ seeing spots.   Reports active fetal movement, contractions: irregular, vaginal bleeding: spotting, membranes: intact. Initiated prenatal care at [facility] then transferred to [facility] at 23 wks d/t CHTN.   EFW 70% at 33wks, AFI 10cm @ 37wks. Pregnancy complicated by [facility] on labetalol 200mg  bid and h/o c/s @ 36wks in [place] d/t eclampsia per note- pt states she thought it was b/c she wouldn't dilate past 9cm but did also have a seizure. Unknown uterine incision.  Denies ruq/epigastric pain, n/v. Hasn't taken anything for headache.  Has repeat c/s scheduled for 10/18.   Past Medical History: Past Medical History  Diagnosis Date  . Hypertension     2014  . Pre-eclampsia     Past Surgical History: Past Surgical History  Procedure Laterality Date  . Cesarean section      Obstetrical History: OB History    Gravida Para Term Preterm AB TAB SAB Ectopic Multiple Living   2 1 0 1 0 0 0 0 0 1       Social History: Social History   Social History  . Marital Status: Single    Spouse Name: N/A  . Number of Children: N/A  . Years of Education: N/A   Social History Main Topics  . Smoking status: Former Research scientist (life sciences)  . Smokeless tobacco: Never Used  . Alcohol Use: No  . Drug Use: No  . Sexual Activity: Yes   Other Topics Concern  . None   Social History Narrative    Family History: Family History  Problem Relation Age of Onset  . Diabetes Mother     Allergies: Allergies  Allergen Reactions  . Sulfa Antibiotics Hives    Reports high fever and red rash on arms    Prescriptions prior to admission  Medication Sig Dispense Refill Last Dose  . labetalol (NORMODYNE) 200 MG tablet Take 1 tablet (200 mg total) by mouth 2 (two) times daily. 60 tablet 3 08/27/2015 at Unknown time  . Prenatal Vit-Fe Fumarate-FA (PRENATAL MULTIVITAMIN) TABS tablet Take 1 tablet by  mouth daily at 12 noon.   08/26/2015 at Unknown time     Review of Systems  Pertinent pos/neg as indicated in HPI    Blood pressure 134/74, pulse 63, temperature 98.2 F (36.8 C), resp. rate 18, height 5' 4.5" (1.638 m), weight 93.985 kg (207 lb 3.2 oz), last menstrual period 11/29/2014. General appearance: alert, cooperative and no distress Lungs: clear to auscultation bilaterally Heart: regular rate and rhythm Abdomen: gravid, soft, non-tender Extremities: trace edema DTR's 2+ No clonus  Fetal monitoring: FHR: 135 bpm, variability: moderate,  Accelerations: Present,  decelerations:  Present occ earlies Uterine activity: regular q 2-70mins  Dilation: 7 Effacement (%): 90 Station: -1 Exam by:: Alycia Rossetti RN Presentation: cephalic confirmed by informal bs u/s   Prenatal labs: ABO, Rh: O/Positive/-- (06/13 0000) Antibody: Negative (06/13 0000) Rubella:   RPR: NON REAC (07/21 1616)  HBsAg: Negative (06/13 0000)  HIV: NONREACTIVE (07/21 1616)  GBS: Positive (10/06 0000)   1 hr Glucola: 124 Genetic screening:  Quad neg Anatomy US: normal, but limited  Results for orders placed or performed during the hospital encounter of 08/27/15 (from the past 24 hour(s))  Urinalysis, Routine w reflex microscopic (not at [facility])   Collection Time: 08/27/15 11:00 PM  Result Value Ref Range   Color, Urine YELLOW YELLOW   APPearance HAZY (  A) CLEAR   Specific Gravity, Urine 1.015 1.005 - 1.030   pH 6.0 5.0 - 8.0   Glucose, UA NEGATIVE NEGATIVE mg/dL   Hgb urine dipstick TRACE (A) NEGATIVE   Bilirubin Urine NEGATIVE NEGATIVE   Ketones, ur NEGATIVE NEGATIVE mg/dL   Protein, ur NEGATIVE NEGATIVE mg/dL   Urobilinogen, UA 0.2 0.0 - 1.0 mg/dL   Nitrite NEGATIVE NEGATIVE   Leukocytes, UA MODERATE (A) NEGATIVE  Protein / creatinine ratio, urine   Collection Time: 08/27/15 11:00 PM  Result Value Ref Range   Creatinine, Urine 88.00 mg/dL   Total Protein, Urine PENDING mg/dL   Protein  Creatinine Ratio PENDING 0.00 - 0.15 mg/mg[Cre]  Urine microscopic-add on   Collection Time: 08/27/15 11:00 PM  Result Value Ref Range   Squamous Epithelial / LPF FEW (A) RARE   WBC, UA 3-6 <3 WBC/hpf   Bacteria, UA FEW (A) RARE   Urine-Other MUCOUS PRESENT   CBC   Collection Time: 08/28/15 12:10 AM  Result Value Ref Range   WBC 8.2 4.0 - 10.5 K/uL   RBC 4.11 3.87 - 5.11 MIL/uL   Hemoglobin 12.5 12.0 - 15.0 g/dL   HCT 35.8 (L) 36.0 - 46.0 %   MCV 87.1 78.0 - 100.0 fL   MCH 30.4 26.0 - 34.0 pg   MCHC 34.9 30.0 - 36.0 g/dL   RDW 13.6 11.5 - 15.5 %   Platelets 226 150 - 400 K/uL     Assessment:  [redacted]w[redacted]d SIUP  G2P0101  Active labor  Prev c/s for eclampsia @ 9cm  CHTN on labetalol  Cat 1 FHR  GBS Positive (10/06 0000)  Plan:  Pt interested in TOLAC, wanted RLTCS originally b/c she thought her bp's would be high again and she would need a c/s anyway. Discussed risks/benefits, also discussed w/ Dr. Roselie Awkward, pt would like to proceed with TOLAC, consent signed  CBC, CMP, protein/creatinine ratio pending d/t headache/seeing spots, bp's are normal  Tylenol for headache  Admit to BS  IV pain meds/epidural prn active labor  Ampicillin for advanced labor, gbs+  Expectant management for now  Anticipate VBAC   Plans to breastfeed  Contraception: undecided  Circumcision: undecided, but likely not  Spanish interpreter used for entire interaction, as well as discussion of risks/benefits of TOLAC vs. RLTCS   Tawnya Crook CNM, WHNP-BC 08/28/2015, 12:19 AM

## 2015-08-28 NOTE — Anesthesia Preprocedure Evaluation (Addendum)
Anesthesia Evaluation  Patient identified by MRN, date of birth, ID band Patient awake    Reviewed: Allergy & Precautions, NPO status   Airway Mallampati: III  TM Distance: >3 FB Neck ROM: Full    Dental  (+) Teeth Intact   Pulmonary former smoker,    breath sounds clear to auscultation       Cardiovascular hypertension, Pt. on medications and Pt. on home beta blockers  Rhythm:Regular Rate:Normal     Neuro/Psych    GI/Hepatic negative GI ROS, Neg liver ROS,   Endo/Other  negative endocrine ROS  Renal/GU negative Renal ROS  negative genitourinary   Musculoskeletal negative musculoskeletal ROS (+)   Abdominal   Peds negative pediatric ROS (+)  Hematology negative hematology ROS (+)   Anesthesia Other Findings   Reproductive/Obstetrics (+) Pregnancy                            Lab Results  Component Value Date   WBC 8.2 08/28/2015   HGB 12.5 08/28/2015   HCT 35.8* 08/28/2015   MCV 87.1 08/28/2015   PLT 226 08/28/2015     Anesthesia Physical Anesthesia Plan  ASA: III  Anesthesia Plan: Epidural   Post-op Pain Management:    Induction:   Airway Management Planned:   Additional Equipment:   Intra-op Plan:   Post-operative Plan:   Informed Consent: I have reviewed the patients History and Physical, chart, labs and discussed the procedure including the risks, benefits and alternatives for the proposed anesthesia with the patient or authorized representative who has indicated his/her understanding and acceptance.     Plan Discussed with:   Anesthesia Plan Comments:         Anesthesia Quick Evaluation

## 2015-08-28 NOTE — Progress Notes (Signed)
Pt reports minimal relief after epidural placement. PCEA button used x2. Pt rates pain 6/10 in lower abd. Dr. Smith Robert of anesthesia called and informed of pt's complaint. Orders to repeat PCEA dose and call if pain is not relieved.

## 2015-08-28 NOTE — Progress Notes (Signed)
Patient ID: Bonnie Morales, female   DOB: 01/29/1981, 34 y.o.   MRN: [id number] Bonnie Morales is a 34 y.o. G2P0101 at [redacted]w[redacted]d admitted for active labor Called by RN for variables after SROM/epidural not responsive to interventions  Subjective: Doing well, just got epidural  Objective: BP 104/56 mmHg  Pulse 68  Temp(Src) 98.1 F (36.7 C) (Oral)  Resp 20  Ht 5' 4.5" (1.638 m)  Wt 93.985 kg (207 lb 3.2 oz)  BMI 35.03 kg/m2  SpO2 100%  LMP 11/29/2014 (Exact Date)    FHT:  FHR: 125 bpm, variability: moderate,  accelerations:  Present,  decelerations:  Present variables UC:   regular, every 2 minutes  SVE:  8/90/-2 IUPC placed w/o difficulty IFSE placed at request of nurse  Labs: Lab Results  Component Value Date   WBC 8.2 08/28/2015   HGB 12.5 08/28/2015   HCT 35.8* 08/28/2015   MCV 87.1 08/28/2015   PLT 226 08/28/2015    Assessment / Plan: Spontaneous labor, progressing normally, w/ variables after SROM, IUPC now placed- will begin amnioinfusion. TOLAC  Labor: Progressing normally Fetal Wellbeing:  Category II Pain Control:  Epidural Pre-eclampsia: n/a I/D:  Ampicillin for gbs+ Anticipated MOD:  VBAC  Tawnya Crook CNM, WHNP-BC 08/28/2015, [phone]

## 2015-08-28 NOTE — Progress Notes (Signed)
Pushing initiated with RN present per CNM orders. RN to call CNM for delivery.

## 2015-08-28 NOTE — Progress Notes (Signed)
Epidural procedure completed. Pt assisted to low-fowlers with left tilt for uterine displacement. EFM re-applied to soft abd. FHR located in RLQ at 140 BPM. Mobility instructions R/T epidural cath reviewed with Spanish interpreter at bedside. Pt verbalizes understanding and can return correct demo.

## 2015-08-28 NOTE — Progress Notes (Signed)
RN remains at bedside. Abd palpated, TOCO and EFM adjusted. Pt repositioned from left to right lateral, O2 in use, and SVE performed. FHR located in LLQ at 135 with variables.

## 2015-08-28 NOTE — Progress Notes (Signed)
Report given and care relinquished to K. Ronnald Ramp, RN.

## 2015-08-28 NOTE — Progress Notes (Signed)
Checked on patient and ordered patient breakfast. Spanish Interpreter

## 2015-08-28 NOTE — Progress Notes (Signed)
I checked on patients needs.  Spanish interpreter

## 2015-08-28 NOTE — Anesthesia Procedure Notes (Signed)
Epidural Patient location during procedure: OB Start time: 08/28/2015 2:02 AM End time: 08/28/2015 2:11 AM  Staffing Anesthesiologist: Suella Broad D Performed by: anesthesiologist   Preanesthetic Checklist Completed: patient identified, site marked, surgical consent, pre-op evaluation, timeout performed, IV checked, risks and benefits discussed and monitors and equipment checked  Epidural Patient position: sitting Prep: DuraPrep Patient monitoring: heart rate, continuous pulse ox and blood pressure Approach: midline Location: L4-L5 Injection technique: LOR saline  Needle:  Needle type: Tuohy  Needle gauge: 18 G Needle length: 9 cm and 9 Catheter type: closed end flexible Catheter size: 20 Guage Test dose: negative and Other  Assessment Events: blood not aspirated, injection not painful, no injection resistance, negative IV test and no paresthesia  Additional Notes LOP @ 7  Patient identified. Risks/Benefits/Options discussed with patient including but not limited to bleeding, infection, nerve damage, paralysis, failed block, incomplete pain control, headache, blood pressure changes, nausea, vomiting, reactions to medications, itching and postpartum back pain. Confirmed with bedside nurse the patient's most recent platelet count. Confirmed with patient that they are not currently taking any anticoagulation, have any bleeding history or any family history of bleeding disorders. Patient expressed understanding and wished to proceed. All questions were answered. Sterile technique was used throughout the entire procedure. Please see nursing notes for vital signs. Test dose was given through epidural catheter and negative prior to continuing to dose epidural or start infusion. Warning signs of high block given to the patient including shortness of breath, tingling/numbness in hands, complete motor block, or any concerning symptoms with instructions to call for help. Patient was given  instructions on fall risk and not to get out of bed. All questions and concerns addressed with instructions to call with any issues or inadequate analgesia.      Patient tolerated the insertion well without complications.Reason for block:procedure for pain

## 2015-08-28 NOTE — Progress Notes (Signed)
Amnioinfusion bolus complete. Clear fluid returned on towel. Soft abd noted, depth of variables decreased, and cont rate of 150 ml/hr started.

## 2015-08-28 NOTE — Progress Notes (Signed)
Pt arrived from MAU via stretcher to 171. Pt ambulates self from stretcher to bed safely. Report received and care assumed. EFM applied to soft abd with pt in left lateral position. FHR located in RUQ at 130 BPM.

## 2015-08-28 NOTE — Progress Notes (Signed)
Abd palpated, TOCO adjusted. UC's noted every 2-4 min.

## 2015-08-29 ENCOUNTER — Inpatient Hospital Stay (HOSPITAL_COMMUNITY)
Admission: RE | Admit: 2015-08-29 | Discharge: 2015-08-29 | Disposition: A | Discharge status: Home / Self Care | Payer: Self-pay | Source: Ambulatory Visit

## 2015-08-29 DIAGNOSIS — O34219 Maternal care for unspecified type scar from previous cesarean delivery: Secondary | ICD-10-CM | POA: Diagnosis not present

## 2015-08-29 MED ORDER — IBUPROFEN 600 MG PO TABS: 600.0000 mg | ORAL_TABLET | Freq: Four times a day (QID) | ORAL | Status: DC

## 2015-08-29 NOTE — Discharge Summary (Signed)
OB Discharge Summary     Patient Name: Bonnie Morales DOB: [birth year] MRN: [id number]  Date of admission: 08/27/2015 Delivering MD: Daiva Nakayama   Date of discharge: 08/29/2015  Admitting diagnosis: 37 WKS, MUCUS WITH BLOOD, HIGH BP Intrauterine pregnancy: 103w6d     Secondary diagnosis: Chronic Hypertension Principal Problem:   Supervision of high risk pregnancy, antepartum Active Problems:   VBAC (vaginal birth after Cesarean)   Chronic hypertension complicating pregnancy, antepartum   Previous cesarean delivery affecting pregnancy, antepartum   History of preterm delivery, currently pregnant   Exposure to potential infection     Discharge diagnosis: Term Pregnancy Delivered and VBAC                                                                                                Post partum procedures:none  Augmentation: none  Complications: None  Hospital course:  Onset of Labor With Vaginal Delivery     34 y.o. yo G2P1101 at [redacted]w[redacted]d was admitted in [place] 08/27/2015. Patient had an uncomplicated labor course as follows:  Membrane Rupture Time/Date: 2:45 AM ,08/28/2015   Intrapartum Procedures: Episiotomy: None [1]                                         Lacerations:  1st degree [2]  Patient had a delivery of a Viable infant. 08/28/2015  Information for the patient's newborn:  [place], [place] [[id number]]  Delivery Method: Vag-Spont   Pateint had an uncomplicated postpartum course.  She is ambulating, tolerating a regular diet, passing flatus, and urinating well. Patient is discharged home in stable condition on 08/29/2015.  Physical exam  Filed Vitals:   08/28/15 1528 08/28/15 1833 08/28/15 2057 08/29/15 0604  BP: 120/56 130/77 125/61 124/69  Pulse: 63 75 63 58  Temp: 98.3 F (36.8 C) 98.2 F (36.8 C) 98.4 F (36.9 C) 98 F (36.7 C)  TempSrc: Oral Oral Oral Oral  Resp: 20 20 18 20   Height:      Weight:      SpO2: 100% 100%      General: alert, cooperative and no distress Lochia: appropriate Uterine Fundus: firm Incision: Healing well with no significant drainage, No significant erythema DVT Evaluation: No evidence of DVT seen on physical exam. Labs: Lab Results  Component Value Date   WBC 8.2 08/28/2015   HGB 12.5 08/28/2015   HCT 35.8* 08/28/2015   MCV 87.1 08/28/2015   PLT 226 08/28/2015   CMP Latest Ref Rng 08/28/2015  Glucose 65 - 99 mg/dL 85  BUN 6 - 20 mg/dL 11  Creatinine 0.44 - 1.00 mg/dL 0.48  Sodium 135 - 145 mmol/L 134(L)  Potassium 3.5 - 5.1 mmol/L 4.2  Chloride 101 - 111 mmol/L 106  CO2 22 - 32 mmol/L 21(L)  Calcium 8.9 - 10.3 mg/dL 9.0  Total Protein 6.5 - 8.1 g/dL 6.9  Total Bilirubin 0.3 - 1.2 mg/dL 0.4  Alkaline Phos 38 - 126 U/L 181(H)  AST 15 - 41 U/L 21  ALT 14 - 54 U/L 11(L)    Discharge instruction: per After Visit Summary and "Baby and Me Booklet".  Medications:    Medication List    TAKE these medications        ibuprofen 600 MG tablet  Commonly known as:  ADVIL,MOTRIN  Take 1 tablet (600 mg total) by mouth every 6 (six) hours.     labetalol 200 MG tablet  Commonly known as:  NORMODYNE  Take 1 tablet (200 mg total) by mouth 2 (two) times daily.     prenatal multivitamin Tabs tablet  Take 1 tablet by mouth daily at 12 noon.       Diet: routine diet  Activity: Advance as tolerated. Pelvic rest for 6 weeks.   Outpatient follow up: 1 week- baby love BP check, 6 weeks-routine PP  Postpartum contraception: Undecided  Newborn Data: Live born female  Birth Weight: 6 lb 5.6 oz (2880 g) APGAR: 8, 9  Baby Feeding: Breast Disposition:home with mother   08/29/2015 Caren Macadam, MD

## 2015-08-29 NOTE — Discharge Instructions (Signed)
Cuidados en el postparto luego de un parto vaginal  °(Postpartum Care After Vaginal Delivery) °Después del parto (período de postparto), la estadía normal en el hospital es de 24-72 horas. Si hubo problemas con el trabajo de parto o el parto, o si tiene otros problemas médicos, es posible que deba permanecer en el hospital por más tiempo.  °Mientras esté en el hospital, recibirá ayuda e instrucciones sobre cómo cuidar de usted misma y de su bebé recién nacido durante el postparto.  °Mientras esté en el hospital:  °· Asegúrese de decirle a las enfermeras si siente dolor o malestar, así como donde siente el dolor y qué empeora el dolor. °· Si usted tuvo una incisión cerca de la vagina (episiotomía) o si ha tenido algún desgarro durante el parto, las enfermeras le pondrán hielo sobre la episiotomía o el desgarro. Las bolsas de hielo pueden ayudar a reducir el dolor y la hinchazón. °· Si está amamantando, puede sentir contracciones dolorosas en el útero durante algunas semanas. Esto es normal. Las contracciones ayudan a que el útero vuelva a su tamaño normal. °· Es normal tener algo de sangrado después del parto. °¨ Durante los primeros 1-3 días después del parto, el flujo es de color rojo y la cantidad puede ser similar a un período. °¨ Es frecuente que el flujo se inicie y se detenga. °¨ En los primeros días, puede eliminar algunos coágulos pequeños. Informe a las enfermeras si elimina coágulos grandes o aumenta el flujo. °¨ No  elimine los coágulos de sangre por el inodoro antes de que la enfermera los vea. °¨ Durante los próximos 3 a 10 días después del parto, el flujo debe ser más acuoso y rosado o marrón. °¨ De diez a catorce días después del parto, el flujo debe ser una pequeña cantidad de secreción de color blanco amarillento. °¨ La cantidad de flujo disminuirá en las primeras semanas después del parto. El flujo puede detenerse en 6-8 semanas. La mayoría de las mujeres no tienen más flujo a las 12 semanas  después del parto. °· Usted debe cambiar sus apósitos con frecuencia. °· Lávese bien las manos con agua y jabón durante al menos 20 segundos después de cambiar el apósito, usar el baño o antes de sostener o alimentar a su recién nacido. °· Usted podrá sentir como que tiene que vaciar la vejiga durante las primeras 6-8 horas después del parto. °· En caso de que sienta debilidad, mareo o desmayo, llame a la enfermera antes de levantarse de la cama por primera vez y antes de tomar una ducha por primera vez. °· Dentro de los primeros días después del parto, sus mamas pueden comenzar a estar sensibles y llenas. Esto se llama congestión. La sensibilidad en los senos por lo general desaparece dentro de las 48-72 horas después de que ocurre la congestión. También puede notar que la leche se escapa de sus senos. Si no está amamantando no estimule sus pechos. La estimulación de las mamas hace que sus senos produzcan más leche. °· Pasar tanto tiempo como le sea posible con el bebé recién nacido es muy importante. Durante ese tiempo, usted y su bebé deben sentirse cerca y conocerse uno al otro. Tener al bebé en su habitación (alojamiento conjunto) ayudará a fortalecer el vínculo con el bebé recién nacido. Esto le dará tiempo para conocerlo y atenderlo de manera cómoda. °· Las hormonas se modifican después del parto. A veces, los cambios hormonales pueden causar tristeza o ganas de llorar por un tiempo. Estos sentimientos   no deben durar ms de [employer]. Si duran ms que eso, debe hablar con su mdico.  Si lo desea, hable con su mdico acerca de los mtodos de planificacin familiar o mtodos anticonceptivos.  Hable con su mdico acerca de las vacunas. El mdico puede indicarle que se aplique las siguientes vacunas antes de salir del hospital:  [place] contra el ttanos, la difteria y la tos ferina (Tdap) o el ttanos y la difteria (Td). Es muy importante que usted y su familia (incluyendo a los abuelos) u otras  personas que cuidan al recin nacido estn al da con las vacunas Tdap o Td. Las vacunas Tdap o Td pueden ayudar a proteger al recin nacido de enfermedades.  Inmunizacin contra la rubola.  Inmunizacin contra la varicela.  Inmunizacin contra la gripe. Usted debe recibir esta vacunacin anual si no la ha recibido Solicitor.   Esta informacin no tiene Marine scientist el consejo del mdico. Asegrese de hacerle al mdico cualquier pregunta que tenga.   Document Released: 08/26/2007 Document Revised: 07/23/2012 Elsevier Interactive Patient Education [employer].

## 2015-08-29 NOTE — Progress Notes (Signed)
UR chart review completed.  

## 2015-08-29 NOTE — Anesthesia Postprocedure Evaluation (Signed)
  Anesthesia Post-op Note  Patient: Bonnie Morales  Procedure(s) Performed: * No procedures listed *  Patient Location: Mother/Baby  Anesthesia Type:Epidural  Level of Consciousness: awake, alert  and oriented  Airway and Oxygen Therapy: Patient Spontanous Breathing  Post-op Pain: none  Post-op Assessment: Post-op Vital signs reviewed, Patient's Cardiovascular Status Stable, Respiratory Function Stable, Patent Airway, No signs of Nausea or vomiting, Adequate PO intake, Pain level controlled and No headache              Post-op Vital Signs: Reviewed and stable  Last Vitals:  Filed Vitals:   08/29/15 0604  BP: 124/69  Pulse: 58  Temp: 36.7 C  Resp: 20    Complications: No apparent anesthesia complications

## 2015-08-29 NOTE — Progress Notes (Signed)
Post Partum Day 1 Subjective: no complaints, up ad lib, voiding and tolerating PO  Objective: Blood pressure 124/69, pulse 58, temperature 98 F (36.7 C), temperature source Oral, resp. rate 20, height 5' 4.5" (1.638 m), weight 207 lb 3.2 oz (93.985 kg), last menstrual period 11/29/2014, SpO2 100 %, unknown if currently breastfeeding.  Physical Exam:  General: alert, cooperative, appears stated age and no distress Lochia: appropriate Uterine Fundus: firm Incision: n/a DVT Evaluation: No evidence of DVT seen on physical exam. Negative Homan's sign. No cords or calf tenderness.   Recent Labs  08/28/15 0010  HGB 12.5  HCT 35.8*    Assessment/Plan: Plan for discharge tomorrow   LOS: 1 day   Koren Shiver DARLENE 08/29/2015, 6:49 AM

## 2015-08-29 NOTE — Lactation Note (Signed)
This note was copied from the chart of Henrietta. Lactation Consultation Note  [employer]  [phone]. P2.  Breastfed first child for 1 month and then had to stop to medication. Discussed size of stomach, supply and demand and basics. Mom made aware of O/P services, breastfeeding support groups, community resources, and our phone # for post-discharge questions.     Patient Name: Bonnie Morales Date: 08/29/2015 Reason for consult: Initial assessment   Maternal Data Has patient been taught Hand Expression?: Yes Does the patient have breastfeeding experience prior to this delivery?: No  Feeding Feeding Type: Breast Fed Length of feed: 15 min  LATCH Score/Interventions                      Lactation Tools Discussed/Used     Consult Status Consult Status: Follow-up Date: 08/30/15 Follow-up type: In-patient    Vivianne Master [facility] 08/29/2015, 2:58 PM

## 2015-08-30 ENCOUNTER — Inpatient Hospital Stay (HOSPITAL_COMMUNITY): Admission: RE | Admit: 2015-08-30 | Payer: Self-pay | Source: Ambulatory Visit | Admitting: Obstetrics and Gynecology

## 2015-08-30 ENCOUNTER — Encounter (HOSPITAL_COMMUNITY): Admission: RE | Payer: Self-pay | Source: Ambulatory Visit

## 2015-08-30 SURGERY — Surgical Case
Anesthesia: Regional | Site: Abdomen

## 2015-10-03 ENCOUNTER — Ambulatory Visit (INDEPENDENT_AMBULATORY_CARE_PROVIDER_SITE_OTHER): Payer: Medicaid Other | Admitting: Obstetrics & Gynecology

## 2015-10-03 ENCOUNTER — Encounter: Payer: Self-pay | Admitting: Obstetrics & Gynecology

## 2015-10-03 DIAGNOSIS — I10 Essential (primary) hypertension: Secondary | ICD-10-CM | POA: Diagnosis not present

## 2015-10-03 DIAGNOSIS — Z3202 Encounter for pregnancy test, result negative: Secondary | ICD-10-CM | POA: Diagnosis not present

## 2015-10-03 DIAGNOSIS — Z3042 Encounter for surveillance of injectable contraceptive: Secondary | ICD-10-CM | POA: Diagnosis not present

## 2015-10-03 DIAGNOSIS — R109 Unspecified abdominal pain: Secondary | ICD-10-CM | POA: Diagnosis not present

## 2015-10-03 DIAGNOSIS — O1002 Pre-existing essential hypertension complicating childbirth: Secondary | ICD-10-CM | POA: Insufficient documentation

## 2015-10-03 LAB — POCT URINALYSIS DIP (DEVICE)
Bilirubin Urine: NEGATIVE
Glucose, UA: NEGATIVE mg/dL
Nitrite: NEGATIVE
PH: 5.5 (ref 5.0–8.0)
PROTEIN: NEGATIVE mg/dL
UROBILINOGEN UA: 1 mg/dL (ref 0.0–1.0)

## 2015-10-03 LAB — POCT PREGNANCY, URINE: Preg Test, Ur: NEGATIVE

## 2015-10-03 MED ORDER — MEDROXYPROGESTERONE ACETATE 150 MG/ML IM SUSP
150.0000 mg | INTRAMUSCULAR | Status: DC
  Administered 2015-10-03: 150 mg via INTRAMUSCULAR

## 2015-10-03 MED ORDER — HYDROCHLOROTHIAZIDE 25 MG PO TABS: 25.0000 mg | ORAL_TABLET | Freq: Every day | ORAL | Status: DC

## 2015-10-03 NOTE — Patient Instructions (Signed)
Medroxyprogesterone injection [Contraceptive] Qu es este medicamento? Las inyecciones anticonceptivas de MEDROXIPROGESTERONA previenen Water quality scientist. Las [employer] brindarn control de la natalidad durante 3 meses. La Depo-subQ Provera 104 se utiliza tambin para tratar [employer] relacionado con endometriosis. Este medicamento puede ser utilizado para otros usos; si tiene alguna pregunta consulte con su proveedor de atencin mdica o con su farmacutico. Qu le debo informar a mi profesional de la salud antes de tomar este medicamento? Necesita saber si usted presenta alguno de los siguientes problemas o situaciones: -si consume alcohol con frecuencia -asma -enfermedad vascular o antecedente de cogulos sanguneos en los pulmones o las piernas -enfermedad de los [place], como osteoporosis -cncer de mama -diabetes -trastornos de la alimentacin (anorexia nerviosa o bulimia) -alta presin sangunea -infecciones por VIH o SIDA -enfermedad renal -enfermedad heptica -depresin mental -migraa -convulsiones -derrame cerebral -fuma tabaco -sangrado vaginal -una reaccin alrgica o inusual a la medroxiprogesterona, otras hormonas, otros medicamentos, alimentos, colorantes o conservantes -si est embarazada o buscando quedar embarazada -si est amamantando a un beb Cmo debo utilizar este medicamento? El anticonceptivo de Depo-Provera se inyecta por va intramuscular. La Depo-SubQ Provera 104 se inyecta por va subcutnea. Las [employer] un profesional de Technical sales engineer. Usted no puede estar embarazada antes de recibir una inyeccin. La inyeccin normalmente se aplica durante los primeros 5 das despus de comenzar un perodo menstrual o 6 semanas despus de un parto. Hable con su pediatra para informarse acerca del uso de este medicamento en nios. Puede requerir atencin especial. Estas inyecciones han sido usadas en nias que han empezados a tener perodos [place]. Sobredosis: Pngase en  contacto inmediatamente con un centro toxicolgico o una sala de urgencia si usted cree que haya tomado demasiado medicamento. ATENCIN: [employer] es solo para usted. No comparta este medicamento con nadie. Qu sucede si me olvido de una dosis? Trate de no olvidar ninguna dosis. Para mantener el control de natalidad necesitar una inyeccin cada 3 meses. Si no puede asistir a una cita, comunquese con su profesional de la salud para que se la [place]. Si espera ms de 13 semanas entre las inyecciones anticonceptivos de Depo-Provera o ms de 14 semanas entre inyecciones anticonceptivos de Depo-subQ Provera 104, puede quedarse [place]. Si no puede asistir a su cita utilice otro mtodo anticonceptivo. Tal vez deba hacerse una prueba de embarazo antes de recibir otra inyeccin. Qu puede interactuar con este medicamento? No tome esta medicina con ninguno de los siguientes medicamentos: -bosentano Esta medicina tambin puede interactuar con los siguientes medicamentos: -aminoglutethimide -antibiticos o medicamentos para infecciones, especialmente rifampicina, rifabutina, rifapentina y griseofulvina -aprepitant -barbitricos, tales como el fenobarbital o primidona -bexaroteno -carbamazepina -medicamentos para convulsiones, tales como etotona, felbamato, [place], [place], topiramato -modafinilo -hierba de [place] Puede ser que esta lista no menciona todas las posibles interacciones. Informe a su profesional de [employer]	[place] de [employer] productos a base de hierbas, medicamentos de [place] o suplementos nutritivos que est tomando. Si usted fuma, consume bebidas alcohlicas o si utiliza drogas ilegales, indqueselo tambin a su profesional de [employer]	[place]. Algunas sustancias pueden interactuar con su medicamento. A qu debo estar atento al usar [employer]? Este medicamento no la protege de la infeccin por VIH (SIDA) ni de otras enfermedades de transmisin sexual. El uso de este  producto puede provocar una prdida de calcio de sus huesos. La prdida de calcio puede provocar huesos dbiles (osteoporosis). Slo use este producto durante ms de 2 aos si otras formas de anticonceptivos no son apropiados para usted.  Mientre ms tiempo use este producto para el control de la natalidad, tendr ms riesgo de Insurance risk surveyor de [employer]. Consulte a su profesional de Pharmacist, hospital de cmo puede [employer] fuertes. Puede experimentar un cambio en el patrn de sangrado o periodos irregulares. Muchas mujeres dejan de tener periodos [employer]. Si recibe sus inyecciones a tiempo, la posibilidad de quedarse embarazada es muy baja. Si cree que podr [employer], visite a su profesional de la salud lo antes posible. Si desea quedar embarazada dentro del prximo ao, informe a su profesional de KB Home	[place]. [place] de este medicamento puede perdurar durante mucho tiempo despus de recibir su ltima inyeccin. Qu efectos secundarios puedo tener al [employer] este medicamento? Efectos secundarios que debe informar a su mdico o a Barrister's clerk de la salud tan pronto como sea posible: -Chief of Staff como erupcin cutnea, picazn o urticarias, hinchazn de la cara, labios o lengua -secreciones o sensibilidad de las mamas -problemas respiratorios -cambios en la visin -depresin -sensacin de desmayos o mareos, cadas -fiebre -dolor en el abdomen, pecho, entrepierna o piernas -problemas de coordinacin, del habla, al caminar -cansancio o debilidad inusual -color amarillento de los ojos o la piel Efectos secundarios que, por lo general, no requieren Geophysical data processor (debe informarlos a mdico o a Barrister's clerk de la salud si persisten o si son molestos): -cne -retencin de lquidos e hinchazn -dolor de cabeza -perodos menstruales irregulares, manchando o ausencia de perodos menstruales -dolor, picazn o reaccin cutnea temporal en el lugar de la  inyeccin -aumento de peso Puede ser que esta lista no menciona todos los posibles efectos secundarios. Comunquese a su mdico por asesoramiento mdico [employer]. Usted puede informar los efectos secundarios a la FDA por telfono al 1-800-FDA-1088. Dnde debo guardar mi medicina? No se aplica en este caso. Un profesional de Probation officer las inyecciones. ATENCIN: Este folleto es un resumen. Puede ser que no cubra toda la posible informacin. Si usted tiene preguntas acerca de esta medicina, consulte con su mdico, su farmacutico o su profesional de Technical sales engineer.    2016, Elsevier/Gold Standard. (2014-12-21 00:00:00)

## 2015-10-03 NOTE — Progress Notes (Signed)
Patient ID: Bonnie Morales, female   DOB: 10/25/1981, 34 y.o.   MRN: [id number] Subjective:wants depo provera     Bonnie Morales is a 34 y.o. female who presents for a postpartum visit. She is 5 weeks postpartum following a spontaneous vaginal delivery. I have fully reviewed the prenatal and intrapartum course. The delivery was at 38.6 gestational weeks. Outcome: spontaneous vaginal delivery. Anesthesia: epidural. Postpartum course has been complicated by some lower abdominal discomfort possible urinary. Baby's course has been unremarkable. Baby is feeding by both breast and bottle - Similac Advance. Bleeding staining only. Bowel function is slow. Patient reports some constipation. Bladder function is normal, but patient is reporting some bladder pain since delivery. Patient is not sexually active. Contraception method is Depo-Provera injections. Postpartum depression screening: negative.She needs to start depo provera today.  Mariel used for interpreter  The following portions of the patient's history were reviewed and updated as appropriate: allergies, current medications, past family history, past medical history, past social history, past surgical history and problem list.  Review of Systems Pertinent items are noted in HPI.   Objective:    BP 138/71 mmHg  Pulse 55  Temp(Src) 98.3 F (36.8 C) (Oral)  Ht 5\' 4"  (1.626 m)  Wt 196 lb 4.8 oz (89.041 kg)  BMI 33.68 kg/m2  General:  alert, cooperative and no distress           Abdomen: soft, non-tender; bowel sounds normal; no masses,  no organomegaly   Vulva:  small amount of suture at the introitus posterior forchette   Vagina: normal vagina     Corpus: normal  Adnexa:  normal adnexa  Rectal Exam: Not performed.        Assessment:     normal postpartum exam. Pap smear not done at today's visit. Will check urine culture due to urinary symptoms. Perineal laceration healing well HTN on Labetalol Plan:    1. Contraception:  Depo-Provera injections 2. First injection today 3. Follow up in: 3 months or as needed.   4. Change to HCTZ and d/c labetalol. F/U at [place], MD 10/03/2015

## 2015-10-04 LAB — URINE CULTURE: Colony Count: 2000

## 2015-10-12 ENCOUNTER — Ambulatory Visit (HOSPITAL_COMMUNITY): Admission: RE | Admit: 2015-10-12 | Payer: Self-pay | Source: Ambulatory Visit

## 2016-01-02 ENCOUNTER — Ambulatory Visit: Payer: Self-pay

## 2016-11-12 NOTE — L&D Delivery Note (Signed)
Delivery Note At 9:00 PM a viable female was delivered via VBAC, Spontaneous (Presentation: LOA ;  ).  APGAR: 9/9. Weight: pending Placenta status: spontaneous and intact. Cord: 3 vessel, delayed cord clamping 60s. No appreciable varices present. No complications.   Anesthesia:   Episiotomy: None Lacerations:  Superficial right periurethral Suture Repair: NA Est. Blood Loss (mL): 150cc  Mom to postpartum.  Baby to Couplet care / Skin to Skin.  Eloise Levels 08/17/2017, 9:11 PM  OB FELLOW DELIVERY ATTESTATION  I was gloved and present for the delivery in its entirety, and I agree with the above resident's note.    Gailen Shelter, MD OB Fellow 9:25 PM

## 2017-02-11 LAB — OB RESULTS CONSOLE GC/CHLAMYDIA
CHLAMYDIA, DNA PROBE: NEGATIVE
GC PROBE AMP, GENITAL: NEGATIVE

## 2017-02-11 LAB — OB RESULTS CONSOLE ABO/RH: RH Type: POSITIVE

## 2017-02-11 LAB — OB RESULTS CONSOLE RUBELLA ANTIBODY, IGM: RUBELLA: IMMUNE

## 2017-02-11 LAB — OB RESULTS CONSOLE HIV ANTIBODY (ROUTINE TESTING): HIV: NONREACTIVE

## 2017-02-11 LAB — OB RESULTS CONSOLE RPR: RPR: NONREACTIVE

## 2017-02-11 LAB — OB RESULTS CONSOLE ANTIBODY SCREEN: ANTIBODY SCREEN: NEGATIVE

## 2017-02-11 LAB — OB RESULTS CONSOLE HGB/HCT, BLOOD
HEMATOCRIT: 36 %
Hemoglobin: 12.1 g/dL

## 2017-02-11 LAB — OB RESULTS CONSOLE PLATELET COUNT: PLATELETS: 307 10*3/uL

## 2017-02-11 LAB — OB RESULTS CONSOLE HEPATITIS B SURFACE ANTIGEN: Hepatitis B Surface Ag: NEGATIVE

## 2017-02-12 ENCOUNTER — Other Ambulatory Visit (HOSPITAL_COMMUNITY): Payer: Self-pay | Admitting: Nurse Practitioner

## 2017-02-12 DIAGNOSIS — Z3682 Encounter for antenatal screening for nuchal translucency: Secondary | ICD-10-CM

## 2017-02-12 DIAGNOSIS — Z3A12 12 weeks gestation of pregnancy: Secondary | ICD-10-CM

## 2017-02-21 ENCOUNTER — Encounter (HOSPITAL_COMMUNITY): Payer: Self-pay | Admitting: *Deleted

## 2017-02-22 ENCOUNTER — Ambulatory Visit (HOSPITAL_COMMUNITY)
Admission: RE | Admit: 2017-02-22 | Discharge: 2017-02-22 | Disposition: A | Discharge status: Home / Self Care | Payer: Medicaid Other | Source: Ambulatory Visit | Attending: Nurse Practitioner | Admitting: Nurse Practitioner

## 2017-02-22 ENCOUNTER — Encounter (HOSPITAL_COMMUNITY): Payer: Self-pay

## 2017-02-22 DIAGNOSIS — Z3682 Encounter for antenatal screening for nuchal translucency: Secondary | ICD-10-CM | POA: Diagnosis not present

## 2017-02-22 DIAGNOSIS — Z3A12 12 weeks gestation of pregnancy: Secondary | ICD-10-CM

## 2017-02-26 ENCOUNTER — Ambulatory Visit (INDEPENDENT_AMBULATORY_CARE_PROVIDER_SITE_OTHER): Payer: Medicaid Other | Admitting: Obstetrics & Gynecology

## 2017-02-26 ENCOUNTER — Encounter: Payer: Self-pay | Admitting: Obstetrics & Gynecology

## 2017-02-26 VITALS — BP 116/66 | HR 65 | Wt 205.6 lb

## 2017-02-26 DIAGNOSIS — O10912 Unspecified pre-existing hypertension complicating pregnancy, second trimester: Secondary | ICD-10-CM | POA: Diagnosis not present

## 2017-02-26 DIAGNOSIS — O10919 Unspecified pre-existing hypertension complicating pregnancy, unspecified trimester: Secondary | ICD-10-CM

## 2017-02-26 DIAGNOSIS — O0990 Supervision of high risk pregnancy, unspecified, unspecified trimester: Secondary | ICD-10-CM

## 2017-02-26 DIAGNOSIS — O0992 Supervision of high risk pregnancy, unspecified, second trimester: Secondary | ICD-10-CM | POA: Diagnosis not present

## 2017-02-26 DIAGNOSIS — O1002 Pre-existing essential hypertension complicating childbirth: Secondary | ICD-10-CM

## 2017-02-26 NOTE — Progress Notes (Signed)
Benefits of Breastfeeding Live interpreter

## 2017-02-27 DIAGNOSIS — O10919 Unspecified pre-existing hypertension complicating pregnancy, unspecified trimester: Secondary | ICD-10-CM | POA: Insufficient documentation

## 2017-02-27 NOTE — Patient Instructions (Signed)
Segundo trimestre de embarazo (Second Trimester of Pregnancy) El segundo trimestre va desde la semana13 hasta la 28, desde el cuarto hasta el sexto mes, y suele ser el momento en el que mejor se siente. En general, las nuseas matutinas han disminuido o han desaparecido completamente. Tendr ms energa y podr aumentarle el apetito. El beb por nacer (feto) se desarrolla rpidamente. Hacia el final del sexto mes, el beb mide aproximadamente 9 pulgadas (23 cm) y pesa alrededor de 1 libras (700 g). Es probable que sienta al beb moverse (dar pataditas) entre las 18 y 20 semanas del embarazo. CUIDADOS EN EL HOGAR  No fume, no consuma hierbas ni beba alcohol. No tome frmacos que el mdico no haya autorizado.  No consuma ningn producto que contenga tabaco, lo que incluye cigarrillos, tabaco de mascar o cigarrillos electrnicos. Si necesita ayuda para dejar de fumar, consulte al mdico. Puede recibir asesoramiento u otro tipo de apoyo para dejar de fumar.  Tome los medicamentos solamente como se lo haya indicado el mdico. Algunos medicamentos son seguros para tomar durante el embarazo y otros no lo son.  Haga ejercicios solamente como se lo haya indicado el mdico. Interrumpa la actividad fsica si comienza a tener calambres.  Ingiera alimentos saludables de manera regular.  Use un sostn que le brinde buen soporte si sus mamas estn sensibles.  No se d baos de inmersin en agua caliente, baos turcos ni saunas.  Colquese el cinturn de seguridad cuando conduzca.  No coma carne cruda ni queso sin cocinar; evite el contacto con las bandejas sanitarias de los gatos y la tierra que estos animales usan.  Tome las vitaminas prenatales.  Tome entre 1500 y 2000mg de calcio diariamente comenzando en la semana20 del embarazo hasta el parto.  Pruebe tomar un medicamento que la ayude a defecar (un laxante suave) si el mdico lo autoriza. Consuma ms fibra, que se encuentra en las frutas y  verduras frescas y los cereales integrales. Beba suficiente lquido para mantener el pis (orina) claro o de color amarillo plido.  Dese baos de asiento con agua tibia para aliviar el dolor o las molestias causadas por las hemorroides. Use una crema para las hemorroides si el mdico la autoriza.  Si se le hinchan las venas (venas varicosas), use medias de descanso. Levante (eleve) los pies durante 15minutos, 3 o 4veces por da. Limite el consumo de sal en su dieta.  No levante objetos pesados, use zapatos de tacones bajos y sintese derecha.  Descanse con las piernas elevadas si tiene calambres o dolor de cintura.  Visite a su dentista si no lo ha hecho durante el embarazo. Use un cepillo de cerdas suaves para cepillarse los dientes. Psese el hilo dental con suavidad.  Puede seguir manteniendo relaciones sexuales, a menos que el mdico le indique lo contrario.  Concurra a los controles mdicos.  SOLICITE AYUDA SI:  Siente mareos.  Sufre calambres o presin leves en la parte baja del vientre (abdomen).  Sufre un dolor persistente en el abdomen.  Tiene malestar estomacal (nuseas), vmitos, o tiene deposiciones acuosas (diarrea).  Advierte un olor ftido que proviene de la vagina.  Siente dolor al orinar.  SOLICITE AYUDA DE INMEDIATO SI:  Tiene fiebre.  Tiene una prdida de lquido por la vagina.  Tiene sangrado o pequeas prdidas vaginales.  Siente dolor intenso o clicos en el abdomen.  Sube o baja de peso rpidamente.  Tiene dificultades para recuperar el aliento y siente dolor en el pecho.  Sbitamente se   le hinchan mucho el rostro, las manos, los tobillos, los pies o las piernas.  No ha sentido los movimientos del beb durante una hora.  Siente un dolor de cabeza intenso que no se alivia con medicamentos.  Su visin se modifica.  Esta informacin no tiene como fin reemplazar el consejo del mdico. Asegrese de hacerle al mdico cualquier pregunta que  tenga. Document Released: 07/01/2013 Document Revised: 11/19/2014 Document Reviewed: 12/30/2012 Elsevier Interactive Patient Education  2017 Elsevier Inc.  

## 2017-02-27 NOTE — Progress Notes (Signed)
  Subjective:transfer from [place] with [facility]    Bonnie Morales is a [id number] [redacted]w[redacted]d being seen today for her first obstetrical visit.  Her obstetrical history is significant for hypertension. Patient does intend to breast feed. Pregnancy history fully reviewed.  Patient reports no complaints.  Vitals:   02/26/17 1305  BP: 116/66  Pulse: 65  Weight: 205 lb 9.6 oz (93.3 kg)    HISTORY: OB History  Gravida Para Term Preterm AB Living  3 2 1 1  0 2  SAB TAB Ectopic Multiple Live Births  0 0 0 0 2    # Outcome Date GA Lbr Len/2nd Weight Sex Delivery Anes PTL Lv  3 Current           2 Term 08/28/15 [redacted]w[redacted]d 05:04 / 03:28 6 lb 5.6 oz (2.88 kg) M Vag-Spont EPI  LIV  1 Preterm 05/25/04 [redacted]w[redacted]d  4 lb 6.6 oz (2 kg)  CS-Unspec   LIV     Past Medical History:  Diagnosis Date  . Hypertension    2014  . Pre-eclampsia    Past Surgical History:  Procedure Laterality Date  . CESAREAN SECTION     Family History  Problem Relation Age of Onset  . Diabetes Mother   . Hypertension Father      Exam    Uterus:     Pelvic Exam:                                    Skin: normal coloration and turgor, no rashes    Neurologic: oriented, normal mood   Extremities: normal strength, tone, and muscle mass   HEENT extra ocular movement intact, sclera clear, anicteric and neck supple with midline trachea   Mouth/Teeth dental hygiene good   Neck supple   Cardiovascular: regular rate and rhythm   Respiratory:  appears well, vitals normal, no respiratory distress, acyanotic, normal RR   Abdomen: soft, non-tender; bowel sounds normal; no masses,  no organomegaly   Urinary:        Assessment:    Pregnancy: [id number] Patient Active Problem List   Diagnosis Date Noted  . Chronic hypertension during pregnancy, antepartum 02/27/2017  . Supervision of high risk pregnancy, antepartum 02/26/2017  . VBAC (vaginal birth after Cesarean) 08/29/2015  . Exposure to potential infection 05/12/2015         Plan:     Initial labs drawn at the HD Prenatal vitamins. Problem list reviewed and updated. Genetic Screening discussed First Screen: results reviewed.  Ultrasound discussed; fetal survey: requested.  Follow up in 4 weeks. 50% of 30 min visit spent on counseling and coordination of care.  ASA and Labetalol   Emeterio Reeve 02/27/2017

## 2017-03-04 ENCOUNTER — Encounter: Payer: Self-pay | Admitting: *Deleted

## 2017-03-05 ENCOUNTER — Encounter: Payer: Self-pay | Admitting: *Deleted

## 2017-03-11 ENCOUNTER — Other Ambulatory Visit: Payer: Self-pay

## 2017-03-22 ENCOUNTER — Inpatient Hospital Stay (HOSPITAL_COMMUNITY)
Admission: AD | Admit: 2017-03-22 | Discharge: 2017-03-22 | Disposition: A | Discharge status: Home / Self Care | Payer: Medicaid Other | Source: Ambulatory Visit | Attending: Family Medicine | Admitting: Family Medicine

## 2017-03-22 ENCOUNTER — Encounter (HOSPITAL_COMMUNITY): Payer: Self-pay | Admitting: Student

## 2017-03-22 DIAGNOSIS — Z79899 Other long term (current) drug therapy: Secondary | ICD-10-CM | POA: Insufficient documentation

## 2017-03-22 DIAGNOSIS — N898 Other specified noninflammatory disorders of vagina: Secondary | ICD-10-CM | POA: Insufficient documentation

## 2017-03-22 DIAGNOSIS — O162 Unspecified maternal hypertension, second trimester: Secondary | ICD-10-CM | POA: Insufficient documentation

## 2017-03-22 DIAGNOSIS — Z7982 Long term (current) use of aspirin: Secondary | ICD-10-CM | POA: Insufficient documentation

## 2017-03-22 DIAGNOSIS — O34219 Maternal care for unspecified type scar from previous cesarean delivery: Secondary | ICD-10-CM | POA: Insufficient documentation

## 2017-03-22 DIAGNOSIS — O2342 Unspecified infection of urinary tract in pregnancy, second trimester: Secondary | ICD-10-CM | POA: Insufficient documentation

## 2017-03-22 DIAGNOSIS — Z888 Allergy status to other drugs, medicaments and biological substances status: Secondary | ICD-10-CM | POA: Insufficient documentation

## 2017-03-22 DIAGNOSIS — Z3A16 16 weeks gestation of pregnancy: Secondary | ICD-10-CM | POA: Insufficient documentation

## 2017-03-22 DIAGNOSIS — R300 Dysuria: Secondary | ICD-10-CM | POA: Insufficient documentation

## 2017-03-22 DIAGNOSIS — R319 Hematuria, unspecified: Secondary | ICD-10-CM | POA: Insufficient documentation

## 2017-03-22 DIAGNOSIS — O26892 Other specified pregnancy related conditions, second trimester: Secondary | ICD-10-CM | POA: Insufficient documentation

## 2017-03-22 DIAGNOSIS — Z87891 Personal history of nicotine dependence: Secondary | ICD-10-CM | POA: Insufficient documentation

## 2017-03-22 LAB — WET PREP, GENITAL
Clue Cells Wet Prep HPF POC: NONE SEEN
SPERM: NONE SEEN
Trich, Wet Prep: NONE SEEN
YEAST WET PREP: NONE SEEN

## 2017-03-22 LAB — URINALYSIS, ROUTINE W REFLEX MICROSCOPIC
BILIRUBIN URINE: NEGATIVE
Glucose, UA: NEGATIVE mg/dL
Ketones, ur: NEGATIVE mg/dL
NITRITE: POSITIVE — AB
PH: 6 (ref 5.0–8.0)
Protein, ur: 100 mg/dL — AB
SPECIFIC GRAVITY, URINE: 1.023 (ref 1.005–1.030)

## 2017-03-22 MED ORDER — NITROFURANTOIN MONOHYD MACRO 100 MG PO CAPS: 100.0000 mg | ORAL_CAPSULE | Freq: Two times a day (BID) | ORAL | 0 refills | Status: DC

## 2017-03-22 NOTE — Discharge Instructions (Signed)
Embarazo e infeccin urinaria (Pregnancy and Urinary Tract Infection) QU ES UNA INFECCIN URINARIA? Una infeccin urinaria (IU) puede ocurrir en Clinical cytogeneticist de las vas [place]. Estas incluyen los riones, los tubos que [employer] riones con la vejiga (urteres), la vejiga y el tubo por el que se elimina la orina del cuerpo (uretra). Estos rganos fabrican, Buyer, retail y eliminan la orina del organismo. La IU puede ser una infeccin de la vejiga (cistitis) o una infeccin de los riones (pielonefritis). Esta infeccin puede deberse a hongos, virus o bacterias. Las bacterias son las causas ms comunes de las IU. Es ms probable presentar una IU durante el embarazo por estas razones:  Los cambios fsicos y hormonales por los que atraviesa el cuerpo pueden hacer que sea ms fcil que las bacterias ingresen en las vas urinarias.  El feto en desarrollo hace presin sobre el tero y puede afectar el flujo de [place]. LA IU PONE EN RIESGO AL BEB? Una IU no tratada Solicitor podra ocasionar una infeccin en los riones, lo que puede causar problemas de salud que afecten al beb. Algunas de las complicaciones posibles de una IU no tratada son las siguientes:  Tener al beb antes de las 37semanas de Media planner (prematuro).  Tener un beb con bajo peso al nacer.  Presentar hipertensin arterial durante el embarazo (preeclampsia). CULES SON LOS SNTOMAS DE LA IU? Entre los sntomas de una IU, se incluyen los siguientes:  [place].  Miccin frecuente o eliminacin de pequeas cantidades de orina con frecuencia.  Necesidad urgente de Garment/textile technologist.  Sensacin de ardor o dolor al [employer].  Orina con mal olor u olor atpico.  Bennie Hind turbia.  Dolor en la parte baja del abdomen o en la espalda.  Dificultad para orinar.  Sangre en la orina.  Vmitos o ms apetito de lo normal.  Diarrea o dolor abdominal.  Tiene secrecin de flujo vaginal. CULES SON LAS OPCIONES DE TRATAMIENTO  PARA LA IU DURANTE EL EMBARAZO? El tratamiento de esta afeccin puede incluir lo siguiente:  Antibiticos cuyo uso es seguro durante el Media planner.  Otros medicamentos para tratar las causas menos frecuentes de infeccin urinaria. CMO PUEDO PREVENIR UNA IU? Para prevenir la IU, haga lo siguiente:  Vaya al bao en cuanto sienta la necesidad de [place].  Siempre debe limpiarse desde adelante hacia atrs.  Lvese el rea genital con agua tibia y [employer].  Vaciar la vejiga antes y despus de Clinical biochemist.  Use ropa interior de algodn.  Limite el consumo de alimentos y bebidas con alto contenido de azcar, como gaseosas comunes, jugos y [place].  Beba de 6a 8vasos de Public affairs consultant.  No use pantalones ajustados.  No se haga duchas vaginales ni use desodorantes en aerosol.  No tome alcohol, cafena ni bebidas gaseosas. Estas sustancias pueden irritar la vejiga. [place]? Solicite atencin mdica si:  Los sntomas no mejoran o empeoran.  Tiene fiebre despus [place].  Tiene una erupcin cutnea.  Tiene flujo vaginal anormal.  Siente dolor en la espalda o en el costado.  Tiene escalofros.  Tiene nuseas y vmitos. [place]? Solicite atencin mdica de inmediato si est embarazada y le sucede lo siguiente:  Siente Designer, jewellery.  Siente dolor en la parte inferior del abdomen.  Tiene una prdida de lquido por la vagina.  Observa sangre en la orina.  Tiene vmitos y no puede tragar medicamentos ni agua. Esta  informacin no tiene Marine scientist el consejo del mdico. Asegrese de hacerle al mdico cualquier pregunta que tenga. Document Released: 07/23/2012 Document Revised: 02/20/2016 Document Reviewed: 09/19/2015 Elsevier Interactive Patient Education  2017 [employer].

## 2017-03-22 NOTE — MAU Note (Signed)
Today at 6 pm, starting having Low abd pain and burning pain, bleeding with urination.  Green discharge a week ago and bought vaginal cream at [facility] and it helped little bit.

## 2017-03-22 NOTE — MAU Provider Note (Signed)
History     CSN: [id number]  Arrival date and time: 03/22/17 2117  First Provider Initiated Contact with Patient 03/22/17 2146   Chief Complaint  Patient presents with  . Dysuria  . Abdominal Cramping  . Hematuria  . Vaginal Discharge   HPI Bonnie Morales is a 36 y.o. [id number] at [redacted]w[redacted]d who presents with urinary complaints & vaginal discharge. Reports thin green discharge last week that she used OTC meds for. Since then discharge has turned white. No odor. No vaginal irritation or itching. Lower abdominal cramping & dysuria since yesterday, then noted blood in urine this morning. Symptoms occur with every urination. Lower abdominal pain is intermittent and sharp. Rates pain 6/10. Has not treated. Voiding makes pain worse. Denies vaginal bleeding, n/v, fever/chills, or flank pain.   OB History    Gravida Para Term Preterm AB Living   3 2 1 1  0 2   SAB TAB Ectopic Multiple Live Births   0 0 0 0 2      Past Medical History:  Diagnosis Date  . Hypertension    2014  . Pre-eclampsia     Past Surgical History:  Procedure Laterality Date  . CESAREAN SECTION      Family History  Problem Relation Age of Onset  . Diabetes Mother   . Hypertension Mother   . Heart disease Mother        tachycardia  . Hypertension Father     Social History  Substance Use Topics  . Smoking status: Former Research scientist (life sciences)  . Smokeless tobacco: Never Used  . Alcohol use No    Allergies:  Allergies  Allergen Reactions  . Sulfa Antibiotics Hives    Reports high fever and red rash on arms    Facility-Administered Medications Prior to Admission  Medication Dose Route Frequency Provider Last Rate Last Dose  . medroxyPROGESTERone (DEPO-PROVERA) injection 150 mg  150 mg Intramuscular Q90 days Woodroe Mode, MD   150 mg at 10/03/15 1549   Prescriptions Prior to Admission  Medication Sig Dispense Refill Last Dose  . ASPIRIN PO Take by mouth.   Taking  . docusate sodium (COLACE) 100 MG capsule Take  100 mg by mouth daily.   Not Taking  . FENUGREEK PO Take by mouth.   Not Taking  . hydrochlorothiazide (HYDRODIURIL) 25 MG tablet Take 1 tablet (25 mg total) by mouth daily. (Patient not taking: Reported on 02/22/2017) 30 tablet 3 Not Taking  . ibuprofen (ADVIL,MOTRIN) 600 MG tablet Take 1 tablet (600 mg total) by mouth every 6 (six) hours. (Patient not taking: Reported on 02/22/2017) 90 tablet 0 Not Taking  . LABETALOL HCL PO Take by mouth.   Taking  . Prenatal Vit-Fe Fumarate-FA (PRENATAL MULTIVITAMIN) TABS tablet Take 1 tablet by mouth daily at 12 noon.   Taking    Review of Systems  Constitutional: Negative.  Negative for chills and fever.  Gastrointestinal: Positive for abdominal pain. Negative for constipation, diarrhea, nausea and vomiting.  Genitourinary: Positive for dysuria, frequency, hematuria and vaginal discharge. Negative for flank pain and vaginal bleeding.   Physical Exam   Blood pressure 129/65, pulse 87, temperature 98.2 F (36.8 C), temperature source Oral, resp. rate 16, height 5\' 5"  (1.651 m), weight 206 lb (93.4 kg), last menstrual period 11/27/2016, SpO2 99 %, unknown if currently breastfeeding.  Physical Exam  Nursing note and vitals reviewed. Constitutional: She is oriented to person, place, and time. She appears well-developed and well-nourished. No distress.  HENT:  Head: Normocephalic  and atraumatic.  Eyes: Conjunctivae are normal. Right eye exhibits no discharge. Left eye exhibits no discharge. No scleral icterus.  Neck: Normal range of motion.  Cardiovascular: Normal rate, regular rhythm and normal heart sounds.   No murmur heard. Respiratory: Effort normal and breath sounds normal. No respiratory distress. She has no wheezes.  GI: Soft. Bowel sounds are normal. There is no tenderness. There is no CVA tenderness.  Genitourinary: No bleeding in the vagina. Vaginal discharge (small amount of thin white discharge) found.  Genitourinary Comments: Cervix closed   Neurological: She is alert and oriented to person, place, and time.  Skin: Skin is warm and dry. She is not diaphoretic.  Psychiatric: She has a normal mood and affect. Her behavior is normal. Judgment and thought content normal.    MAU Course  Procedures Results for orders placed or performed during the hospital encounter of 03/22/17 (from the past 24 hour(s))  Urinalysis, Routine w reflex microscopic     Status: Abnormal   Collection Time: 03/22/17  9:20 PM  Result Value Ref Range   Color, Urine YELLOW YELLOW   APPearance CLOUDY (A) CLEAR   Specific Gravity, Urine 1.023 1.005 - 1.030   pH 6.0 5.0 - 8.0   Glucose, UA NEGATIVE NEGATIVE mg/dL   Hgb urine dipstick LARGE (A) NEGATIVE   Bilirubin Urine NEGATIVE NEGATIVE   Ketones, ur NEGATIVE NEGATIVE mg/dL   Protein, ur 100 (A) NEGATIVE mg/dL   Nitrite POSITIVE (A) NEGATIVE   Leukocytes, UA LARGE (A) NEGATIVE   RBC / HPF TOO NUMEROUS TO COUNT 0 - 5 RBC/hpf   WBC, UA TOO NUMEROUS TO COUNT 0 - 5 WBC/hpf   Bacteria, UA FEW (A) NONE SEEN   Squamous Epithelial / LPF 0-5 (A) NONE SEEN   WBC Clumps PRESENT    Mucous PRESENT    Non Squamous Epithelial 0-5 (A) NONE SEEN  Wet prep, genital     Status: Abnormal   Collection Time: 03/22/17  9:55 PM  Result Value Ref Range   Yeast Wet Prep HPF POC NONE SEEN NONE SEEN   Trich, Wet Prep NONE SEEN NONE SEEN   Clue Cells Wet Prep HPF POC NONE SEEN NONE SEEN   WBC, Wet Prep HPF POC MANY (A) NONE SEEN   Sperm NONE SEEN     MDM FHT 147 VSS, NAD, no CVAT Cervix closed U/a, GC/CT, wet prep U/a + nitrites -- will tx & send urine for culture Assessment and Plan  A: 1. Urinary tract infection in mother during second trimester of pregnancy    P: Discharge home Rx macrobid Discussed reasons to return to MAU Keep follow up appointment with OB/PCP  GC/CT & urine culture pending   Jorje Guild 03/22/2017, 9:46 PM

## 2017-03-25 LAB — GC/CHLAMYDIA PROBE AMP (~~LOC~~) NOT AT ARMC
Chlamydia: NEGATIVE
Neisseria Gonorrhea: NEGATIVE

## 2017-03-25 LAB — CULTURE, OB URINE: Culture: 60000 — AB

## 2017-03-26 ENCOUNTER — Encounter: Payer: Medicaid Other | Admitting: Obstetrics and Gynecology

## 2017-04-09 ENCOUNTER — Other Ambulatory Visit: Payer: Self-pay | Admitting: Obstetrics & Gynecology

## 2017-04-09 ENCOUNTER — Ambulatory Visit (HOSPITAL_COMMUNITY)
Admission: RE | Admit: 2017-04-09 | Discharge: 2017-04-09 | Disposition: A | Discharge status: Home / Self Care | Payer: Self-pay | Source: Ambulatory Visit | Attending: Obstetrics & Gynecology | Admitting: Obstetrics & Gynecology

## 2017-04-09 ENCOUNTER — Encounter (HOSPITAL_COMMUNITY): Payer: Self-pay

## 2017-04-09 ENCOUNTER — Ambulatory Visit (INDEPENDENT_AMBULATORY_CARE_PROVIDER_SITE_OTHER): Payer: Self-pay | Admitting: Obstetrics and Gynecology

## 2017-04-09 ENCOUNTER — Other Ambulatory Visit (HOSPITAL_COMMUNITY): Payer: Self-pay | Admitting: *Deleted

## 2017-04-09 VITALS — BP 112/64 | HR 86 | Wt 204.0 lb

## 2017-04-09 DIAGNOSIS — Z8759 Personal history of other complications of pregnancy, childbirth and the puerperium: Secondary | ICD-10-CM

## 2017-04-09 DIAGNOSIS — Z98891 History of uterine scar from previous surgery: Secondary | ICD-10-CM

## 2017-04-09 DIAGNOSIS — O34219 Maternal care for unspecified type scar from previous cesarean delivery: Secondary | ICD-10-CM

## 2017-04-09 DIAGNOSIS — O10919 Unspecified pre-existing hypertension complicating pregnancy, unspecified trimester: Secondary | ICD-10-CM

## 2017-04-09 DIAGNOSIS — O09219 Supervision of pregnancy with history of pre-term labor, unspecified trimester: Secondary | ICD-10-CM | POA: Insufficient documentation

## 2017-04-09 DIAGNOSIS — Z369 Encounter for antenatal screening, unspecified: Secondary | ICD-10-CM | POA: Insufficient documentation

## 2017-04-09 DIAGNOSIS — O10912 Unspecified pre-existing hypertension complicating pregnancy, second trimester: Secondary | ICD-10-CM

## 2017-04-09 DIAGNOSIS — Z3A19 19 weeks gestation of pregnancy: Secondary | ICD-10-CM

## 2017-04-09 DIAGNOSIS — O99212 Obesity complicating pregnancy, second trimester: Secondary | ICD-10-CM | POA: Insufficient documentation

## 2017-04-09 DIAGNOSIS — O0990 Supervision of high risk pregnancy, unspecified, unspecified trimester: Secondary | ICD-10-CM

## 2017-04-09 DIAGNOSIS — O169 Unspecified maternal hypertension, unspecified trimester: Principal | ICD-10-CM

## 2017-04-09 DIAGNOSIS — O10019 Pre-existing essential hypertension complicating pregnancy, unspecified trimester: Secondary | ICD-10-CM

## 2017-04-09 DIAGNOSIS — O09899 Supervision of other high risk pregnancies, unspecified trimester: Secondary | ICD-10-CM

## 2017-04-09 DIAGNOSIS — O0992 Supervision of high risk pregnancy, unspecified, second trimester: Secondary | ICD-10-CM

## 2017-04-09 DIAGNOSIS — O1002 Pre-existing essential hypertension complicating childbirth: Secondary | ICD-10-CM

## 2017-04-09 DIAGNOSIS — O289 Unspecified abnormal findings on antenatal screening of mother: Secondary | ICD-10-CM

## 2017-04-09 HISTORY — DX: History of uterine scar from previous surgery: Z98.891

## 2017-04-09 NOTE — Progress Notes (Signed)
   PRENATAL VISIT NOTE  Subjective:  Bonnie Morales is a 36 y.o. [id number] at [redacted]w[redacted]d being seen today for ongoing prenatal care.  She is currently monitored for the following issues for this high-risk pregnancy and has Exposure to potential infection; VBAC (vaginal birth after Cesarean); Supervision of high risk pregnancy, antepartum; Chronic hypertension during pregnancy, antepartum; Abnormal first trimester screen; Previous cesarean section complicating pregnancy; and History of eclampsia on her problem list.  Patient reports no complaints.  Contractions: Not present. Vag. Bleeding: None.  Movement: Present. Denies leaking of fluid.   The following portions of the patient's history were reviewed and updated as appropriate: allergies, current medications, past family history, past medical history, past social history, past surgical history and problem list. Problem list updated.  Objective:   Vitals:   04/09/17 1522  BP: 112/64  Pulse: 86  Weight: 204 lb (92.5 kg)    Fetal Status: Fetal Heart Rate (bpm): [place]   Movement: Present     General:  Alert, oriented and cooperative. Patient is in no acute distress.  Skin: Skin is warm and dry. No rash noted.   Cardiovascular: Normal heart rate noted  Respiratory: Normal respiratory effort, no problems with respiration noted  Abdomen: Soft, gravid, appropriate for gestational age. Pain/Pressure: Absent     Pelvic:  Cervical exam deferred        Extremities: Normal range of motion.  Edema: None  Mental Status: Normal mood and affect. Normal behavior. Normal judgment and thought content.   Assessment and Plan:  Pregnancy: G3P1102 at [redacted]w[redacted]d  1. Chronic hypertension during pregnancy, antepartum BP well controlled. Continue labetalol and ASA  2. Supervision of high risk pregnancy, antepartum Patient is doing well without complaints Anatomy ultrasound done today but report not available Patient reports being told having a short cervix and  needs a rescue cerclage. This was confirmed with Dr. Nehemiah Settle (attending on call). Patient will be scheduled for rescue cerclage this week. Anatomy ultrasound needs to be reviewed at next visit  3. Abnormal first trimester screen Patient informed of abnormal first screen DSR 1:13 Patient was not aware of this results Genetic counseling appointment scheduled as patient is interested in amniocentesis. NIPS was discussed as an option but patient desires a definitive answer More than 30 minutes was spent counseling and reassuring patient regarding screening test results - AMB MFM GENETICS REFERRAL  4. Previous cesarean section complicating pregnancy Will discuss delivery plans at next visit  5. History of eclampsia Continue ASA  Preterm labor symptoms and general obstetric precautions including but not limited to vaginal bleeding, contractions, leaking of fluid and fetal movement were reviewed in detail with the patient. Please refer to After Visit Summary for other counseling recommendations.  Return in about 2 weeks (around 04/23/2017).   Mora Bellman, MD

## 2017-04-10 ENCOUNTER — Ambulatory Visit (HOSPITAL_COMMUNITY)
Admission: RE | Admit: 2017-04-10 | Discharge: 2017-04-10 | Disposition: A | Discharge status: Home / Self Care | Payer: Self-pay | Source: Ambulatory Visit | Attending: Obstetrics and Gynecology | Admitting: Obstetrics and Gynecology

## 2017-04-10 ENCOUNTER — Encounter (HOSPITAL_COMMUNITY)
Admission: RE | Admit: 2017-04-10 | Discharge: 2017-04-10 | Disposition: A | Discharge status: Home / Self Care | Payer: Self-pay | Source: Ambulatory Visit | Attending: Obstetrics and Gynecology | Admitting: Obstetrics and Gynecology

## 2017-04-10 ENCOUNTER — Other Ambulatory Visit: Payer: Self-pay | Admitting: Obstetrics and Gynecology

## 2017-04-10 ENCOUNTER — Encounter (HOSPITAL_COMMUNITY): Payer: Self-pay

## 2017-04-10 DIAGNOSIS — Z3A19 19 weeks gestation of pregnancy: Secondary | ICD-10-CM

## 2017-04-10 DIAGNOSIS — O289 Unspecified abnormal findings on antenatal screening of mother: Secondary | ICD-10-CM

## 2017-04-10 DIAGNOSIS — O352XX0 Maternal care for (suspected) hereditary disease in fetus, not applicable or unspecified: Secondary | ICD-10-CM

## 2017-04-10 DIAGNOSIS — Z315 Encounter for genetic counseling: Secondary | ICD-10-CM | POA: Insufficient documentation

## 2017-04-10 HISTORY — DX: Personal history of urinary calculi: Z87.442

## 2017-04-10 LAB — BASIC METABOLIC PANEL
ANION GAP: 8 (ref 5–15)
BUN: 6 mg/dL (ref 6–20)
CO2: 20 mmol/L — AB (ref 22–32)
Calcium: 9 mg/dL (ref 8.9–10.3)
Chloride: 108 mmol/L (ref 101–111)
Creatinine, Ser: 0.48 mg/dL (ref 0.44–1.00)
GFR calc Af Amer: >60 mL/min (ref >60)
GLUCOSE: 110 mg/dL — AB (ref 65–99)
Potassium: 3.7 mmol/L (ref 3.5–5.1)
Sodium: 136 mmol/L (ref 135–145)

## 2017-04-10 LAB — CBC
HEMATOCRIT: 33.5 % — AB (ref 36.0–46.0)
HEMOGLOBIN: 11.6 g/dL — AB (ref 12.0–15.0)
MCH: 30.1 pg (ref 26.0–34.0)
MCHC: 34.6 g/dL (ref 30.0–36.0)
MCV: 86.8 fL (ref 78.0–100.0)
Platelets: 270 10*3/uL (ref 150–400)
RBC: 3.86 MIL/uL — ABNORMAL LOW (ref 3.87–5.11)
RDW: 14.3 % (ref 11.5–15.5)
WBC: 6.9 10*3/uL (ref 4.0–10.5)

## 2017-04-10 NOTE — Patient Instructions (Addendum)
Your procedure is scheduled on:  Friday, June 1  Enter through the [employer] [facility] at: [place] up the phone at the desk and dial [phone].  Call this number if you have problems the morning of surgery: [phone].  Remember: Do NOT eat food after midnight Thursday  Do NOT drink clear liquids after: 7:30 am Friday, day of surgery  Take these medicines the morning of surgery with a SIP OF WATER:  Labetalol   Do NOT wear jewelry (body piercing), metal hair clips/bobby pins, make-up, or nail polish. Do NOT wear lotions, powders, or perfumes.  You may wear deoderant. Do NOT shave for 48 hours prior to surgery. Do NOT bring valuables to the hospital.   Have a responsible adult drive you home and stay with you for 24 hours after your procedure.  Home with mother Bonnie Morales cell [phone].

## 2017-04-10 NOTE — Pre-Procedure Instructions (Signed)
Used Spanish Interpreter [employer], Spanish Interpreter - [employer] used during [employer].

## 2017-04-10 NOTE — Progress Notes (Signed)
Genetic Counseling  DOB: July 30, 1981 Referring Provider: Mora Bellman, MD Appointment Date: 04/10/2017 Attending: Dr. Griffin Dakin  Ms. Torri Suprena Travaglini was seen for genetic counseling because of an increased risk for fetal Down syndrome based on a First trimester screening result.  Ms. Rodnisha Blomgren was accompanied to the visit by her mother.  In person Spanish interpretation was provided for the entirety of the appointment.  In summary:  Reviewed results of screening test  Increased risk for Down syndrome  Discussed additional screening options  NIPS  Ultrasound  Discussed diagnostic testing options  Amniocentesis - declined  Reviewed family history concerns  Discussed general population carrier screening options - declined  CF  SMA  Hemoglobinopathies  She was counseled regarding the screening result and the associated 1 in 13 risk for fetal Down syndrome.  We reviewed chromosomes, nondisjunction, and the common features and variable prognosis of Down syndrome.  In addition, we reviewed the screen adjusted reduction in risks for trisomy 61 and trisomy 50.  We also discussed other explanations for a screen positive result including: differences in maternal metabolism and normal variation.  We reviewed other available screening options including noninvasive prenatal screening (NIPS)/cell free DNA (cfDNA) screening, and detailed ultrasound.  She was counseled that screening tests are used to modify a patient's a priori risk for aneuploidy, typically based on age. This estimate provides a pregnancy specific risk assessment. We reviewed the benefits and limitations of each option. Specifically, we discussed the conditions for which each test screens, the detection rates, and false positive rates of each. She was also counseled regarding diagnostic testing via amniocentesis. We reviewed the approximate 1 in [phone] risk for complications from amniocentesis, including spontaneous  pregnancy loss. We discussed the possible results that the tests might provide including: positive, negative, unanticipated, and no result. Finally, she was counseled regarding the cost of each option and potential out of pocket expenses. After consideration of all the options, she elected to proceed with NIPS.  Those results will be available in 8-10 days.    Ms. Tiarrah Saville was provided with written information regarding cystic fibrosis (CF), spinal muscular atrophy (SMA) and hemoglobinopathies including the carrier frequency, availability of carrier screening and prenatal diagnosis if indicated.  In addition, we discussed that CF and hemoglobinopathies are routinely screened for as part of the [place] newborn screening panel and SMA is available as an opt-in screen.  After further discussion, she declined screening for CF, SMA and hemoglobinopathies.   Both family histories were reviewed and found to be contributory.  Ms. Farrah Skoda reported that her oldest son was born prematurely at 79 weeks due to pre-eclampsia.  He was born in [place] and had hearing loss related to ear infections which has improved with the placement of tympanostomy tubes.  He has speech delays which is thought to be due to his hearing loss.  Ms. Alexius Ellington reported that her younger sister has intellectual disabilities and at 35 years of age she lives at home.  She was a "difficult pregnancy" and had tachycardia according Ms. Macario Carls Diaz's mother.  She was not potty trained until she was 36 years old and was delayed in her language and her walking.  She has no known underlying etiology for her differences.  Without further information regarding the provided family history, an accurate genetic risk cannot be calculated. Further genetic counseling is warranted if more information is obtained.  Ms. Kimbley Sprague denied exposure to environmental toxins or chemical agents. She denied the use  of alcohol, tobacco or street drugs. She denied  significant viral illnesses during the course of her pregnancy. Her medical and surgical histories were noncontributory.   I counseled Ms. Wynelle Fanny for approximately 50 minutes regarding the above risks and available options.   Cam Hai, MS,  Certified Genetic Counselor

## 2017-04-12 ENCOUNTER — Ambulatory Visit (HOSPITAL_COMMUNITY): Payer: Self-pay | Admitting: Anesthesiology

## 2017-04-12 ENCOUNTER — Encounter (HOSPITAL_COMMUNITY): Payer: Self-pay

## 2017-04-12 ENCOUNTER — Ambulatory Visit (HOSPITAL_COMMUNITY)
Admission: RE | Admit: 2017-04-12 | Discharge: 2017-04-12 | Disposition: A | Discharge status: Home / Self Care | Payer: Self-pay | Source: Ambulatory Visit | Attending: Obstetrics and Gynecology | Admitting: Obstetrics and Gynecology

## 2017-04-12 ENCOUNTER — Encounter (HOSPITAL_COMMUNITY)
Admission: RE | Disposition: A | Discharge status: Home / Self Care | Payer: Self-pay | Source: Ambulatory Visit | Attending: Obstetrics and Gynecology

## 2017-04-12 DIAGNOSIS — O26872 Cervical shortening, second trimester: Secondary | ICD-10-CM | POA: Insufficient documentation

## 2017-04-12 DIAGNOSIS — Z8249 Family history of ischemic heart disease and other diseases of the circulatory system: Secondary | ICD-10-CM | POA: Insufficient documentation

## 2017-04-12 DIAGNOSIS — Z87891 Personal history of nicotine dependence: Secondary | ICD-10-CM | POA: Insufficient documentation

## 2017-04-12 DIAGNOSIS — Z833 Family history of diabetes mellitus: Secondary | ICD-10-CM | POA: Insufficient documentation

## 2017-04-12 DIAGNOSIS — O162 Unspecified maternal hypertension, second trimester: Secondary | ICD-10-CM | POA: Insufficient documentation

## 2017-04-12 DIAGNOSIS — Z87442 Personal history of urinary calculi: Secondary | ICD-10-CM | POA: Insufficient documentation

## 2017-04-12 DIAGNOSIS — E669 Obesity, unspecified: Secondary | ICD-10-CM | POA: Insufficient documentation

## 2017-04-12 DIAGNOSIS — Z3A19 19 weeks gestation of pregnancy: Secondary | ICD-10-CM | POA: Insufficient documentation

## 2017-04-12 HISTORY — PX: CERVICAL CERCLAGE: SHX1329

## 2017-04-12 SURGERY — CERCLAGE, CERVIX, VAGINAL APPROACH
Anesthesia: Spinal | Site: Vagina

## 2017-04-12 MED ORDER — SOD CITRATE-CITRIC ACID 500-334 MG/5ML PO SOLN
ORAL | Status: AC
  Administered 2017-04-12: 30 mL via ORAL
  Filled 2017-04-12: qty 15

## 2017-04-12 MED ORDER — LACTATED RINGERS IV SOLN
INTRAVENOUS | Status: DC
  Administered 2017-04-12: 13:00:00 via INTRAVENOUS
  Administered 2017-04-12: 125 mL/h via INTRAVENOUS

## 2017-04-12 MED ORDER — INDOMETHACIN 50 MG PO CAPS
100.0000 mg | ORAL_CAPSULE | ORAL | Status: AC
  Administered 2017-04-12: 100 mg via ORAL
  Filled 2017-04-12: qty 2

## 2017-04-12 MED ORDER — MEPERIDINE HCL 25 MG/ML IJ SOLN: 6.2500 mg | INTRAMUSCULAR | Status: DC | PRN

## 2017-04-12 MED ORDER — SOD CITRATE-CITRIC ACID 500-334 MG/5ML PO SOLN
30.0000 mL | Freq: Once | ORAL | Status: AC
  Administered 2017-04-12: 30 mL via ORAL
  Filled 2017-04-12: qty 30

## 2017-04-12 MED ORDER — METOCLOPRAMIDE HCL 5 MG/ML IJ SOLN: 10.0000 mg | Freq: Once | INTRAMUSCULAR | Status: DC | PRN

## 2017-04-12 MED ORDER — BUPIVACAINE IN DEXTROSE 0.75-8.25 % IT SOLN
INTRATHECAL | Status: DC | PRN
  Administered 2017-04-12: 1.2 mL via INTRATHECAL

## 2017-04-12 MED ORDER — FENTANYL CITRATE (PF) 100 MCG/2ML IJ SOLN: 25.0000 ug | INTRAMUSCULAR | Status: DC | PRN

## 2017-04-12 MED ORDER — BUPIVACAINE IN DEXTROSE 0.75-8.25 % IT SOLN
INTRATHECAL | Status: AC
  Filled 2017-04-12: qty 2

## 2017-04-12 SURGICAL SUPPLY — 19 items
CANISTER SUCT 3000ML PPV (MISCELLANEOUS) ×3 IMPLANT
CATH ROBINSON RED A/P 16FR (CATHETERS) ×3 IMPLANT
CLOTH BEACON ORANGE TIMEOUT ST (SAFETY) ×3 IMPLANT
COUNTER NEEDLE 1200 MAGNETIC (NEEDLE) ×3 IMPLANT
GLOVE BIO SURGEON STRL SZ7.5 (GLOVE) ×6 IMPLANT
GOWN STRL REUS W/TWL LRG LVL3 (GOWN DISPOSABLE) ×3 IMPLANT
GOWN STRL REUS W/TWL XL LVL3 (GOWN DISPOSABLE) ×3 IMPLANT
PACK VAGINAL MINOR WOMEN LF (CUSTOM PROCEDURE TRAY) ×3 IMPLANT
PAD OB MATERNITY 4.3X12.25 (PERSONAL CARE ITEMS) ×3 IMPLANT
PAD PREP 24X48 CUFFED NSTRL (MISCELLANEOUS) ×3 IMPLANT
SUT ETHIBOND NAB CT1 #1 30IN (SUTURE) ×3 IMPLANT
SUT MERSILENE 5MM BP 1 12 (SUTURE) ×3 IMPLANT
SUT SILK 2 0 SH (SUTURE) ×3 IMPLANT
SYR BULB IRRIGATION 50ML (SYRINGE) ×3 IMPLANT
TOWEL OR 17X24 6PK STRL BLUE (TOWEL DISPOSABLE) ×6 IMPLANT
TRAY FOLEY CATH SILVER 14FR (SET/KITS/TRAYS/PACK) ×3 IMPLANT
TUBING NON-CON 1/4 X 20 CONN (TUBING) ×2 IMPLANT
TUBING NON-CON 1/4 X 20' CONN (TUBING) ×1
YANKAUER SUCT BULB TIP NO VENT (SUCTIONS) ×3 IMPLANT

## 2017-04-12 NOTE — H&P (Signed)
Bonnie Morales is a 79 y.[id number] IUP 19 3/7 weeks with shorten cervix on U/S this past week. CL 1.4-2.0 cm. Pt presents today for cerclage placement. Prenatal care complicated by [facility] (controled) and prior c section. She denies any VB, LOF cramps or pressure. She reports + FM.   OB History    Gravida Para Term Preterm AB Living   3 2 1 1  0 2   SAB TAB Ectopic Multiple Live Births   0 0 0 0 2     Past Medical History:  Diagnosis Date  . History of kidney stones    passed stone  . Hypertension    2014  . Pre-eclampsia   . SVD (spontaneous vaginal delivery) 2016   x 1 [place]   Past Surgical History:  Procedure Laterality Date  . CESAREAN SECTION     epidural - in [place]   Family History: family history includes Diabetes in her mother; Heart disease in her mother; Hypertension in her father and mother. Social History:  reports that she quit smoking about 6 months ago. Her smoking use included Cigarettes. She has a 1.25 pack-year smoking history. She has never used smokeless tobacco. She reports that she does not drink alcohol or use drugs.      Review of Systems  Constitutional: Negative.   Eyes: Negative.   Respiratory: Negative.   Cardiovascular: Negative.   Gastrointestinal: Negative.   Genitourinary: Negative.    History   Blood pressure (!) 111/58, pulse 65, temperature 97.9 F (36.6 C), temperature source Oral, resp. rate 18, last menstrual period 11/27/2016, SpO2 99 %, unknown if currently breastfeeding. Exam Physical Exam  Constitutional: She appears well-developed and well-nourished.  Cardiovascular: Normal rate and regular rhythm.   Respiratory: Effort normal and breath sounds normal.  GI: Soft. Bowel sounds are normal.  Gravid, non tender  Genitourinary:  Genitourinary Comments: Deferred to OR    Prenatal labs: ABO, Rh: O/Positive/-- (04/02 0000) Antibody: Negative (04/02 0000) Rubella: Immune (04/02 0000) RPR: Nonreactive (04/02 0000)  HBsAg:  Negative (04/02 0000)  HIV: Non-reactive (04/02 0000)  GBS:     Assessment/Plan: IUP 19 3/7 weeks Shorten cervix CHTN Prior c section  Pt for cerclage placement today. R/B/Post op care reviewed with pt. Interrupter services used   Chancy Milroy 04/12/2017, 12:44 PM

## 2017-04-12 NOTE — Anesthesia Procedure Notes (Signed)
Spinal  Patient location during procedure: OR Start time: 04/12/2017 1:07 PM Staffing Anesthesiologist: Josephine Igo Performed: anesthesiologist  Preanesthetic Checklist Completed: patient identified, site marked, surgical consent, pre-op evaluation, timeout performed, IV checked, risks and benefits discussed and monitors and equipment checked Spinal Block Patient position: sitting Prep: site prepped and draped and DuraPrep Patient monitoring: heart rate, cardiac monitor, continuous pulse ox and blood pressure Approach: midline Location: L3-4 Injection technique: single-shot Needle Needle type: Pencan  Needle gauge: 24 G Needle length: 9 cm Needle insertion depth: 5 cm Assessment Sensory level: T8 Additional Notes Patient tolerated procedure well. Adequate sensory level.

## 2017-04-12 NOTE — Transfer of Care (Signed)
Immediate Anesthesia Transfer of Care Note  Patient: Bonnie Morales  Procedure(s) Performed: Procedure(s): CERCLAGE CERVICAL (N/A)  Patient Location: PACU  Anesthesia Type:Spinal  Level of Consciousness: awake, alert  and oriented  Airway & Oxygen Therapy: Patient Spontanous Breathing  Post-op Assessment: Report given to RN and Post -op Vital signs reviewed and stable  Post vital signs: Reviewed and stable  Last Vitals:  Vitals:   04/12/17 1209  BP: (!) 111/58  Pulse: 65  Resp: 18  Temp: 36.6 C    Last Pain:  Vitals:   04/12/17 1209  TempSrc: Oral      Patients Stated Pain Goal: 3 (81/15/72 6203)  Complications: No apparent anesthesia complications

## 2017-04-12 NOTE — Op Note (Signed)
Preoperative diagnosis: Shorten cervix   Postoperative diagnosis: Same  Procedure: Cervical cerclage  Surgeon: Legrand Como L. Rip Harbour, M.D.  Anesthesia: Spinal  Findings: External cervical os dilated to 2 cm and internal cervical os dilated to 1 cm.   Estimated blood loss: < 5 cc  Specimens: None  Reason for procedure: Timiya Howells [id number] [redacted]w[redacted]d, with Dx of shorten cervix.  Procedure: Patient was taken to the operating room where spinal analgesia was administered. She was prepped and draped in the usual sterile fashion. Outside prep only. A Foley catheter inserted to drain her bladder. A timeout was performed.  The patient was in dorsal lithotomy.  A weighted speculum was placed inside the vagina. The vaginal was irrigated with NS.   A Deaver was used anteriorly. The cervix was grasped with an open ring  forcep. A 5 mm Mersilene band interlaced with 0 Ethibond was placed in a pursestring fashion. Starting and ending at 12 o'clock.  The Mersilene was tied down. The Ethibond was tied down as well to the point where a red rubber cathter would not pas through the endocervical cannel. The Mersilene knot then was secured with a silk suture to prevent the knot from backing out.  All instrument, needle and lap counts were correct x 2. The patient was taken to recovery in stable condition.  Audrie Lia ErvinMD 04/12/2017 4:08 PM

## 2017-04-12 NOTE — Progress Notes (Signed)
Used Psychologist, clinical to review D/c Instructions with patient and her mother Mardene Celeste.  All questions answered.

## 2017-04-12 NOTE — Discharge Instructions (Signed)
Cerclaje cervical (Cervical Cerclage) El cerclaje cervical es un procedimiento quirrgico para solucionar un cuello del tero incompetente. Un cuello del tero incompetente es un cuello dbil que se abre antes de que comience el trabajo de parto. El cerclaje cervical es un procedimiento en el que se sutura el cuello del tero para cerrarlo durante el embarazo.  INFORME A SU MDICO:   Cualquier alergia que tenga.  Todos los medicamentos que utiliza, incluidos vitaminas, hierbas, gotas oftlmicas, cremas y medicamentos de venta libre.  Problemas previos que usted o los miembros de su familia hayan tenido con el uso de anestsicos.  Enfermedades de la sangre.  Cirugas previas.  Afecciones mdicas que tenga.  Resfros o infecciones recientes. RIESGOS Y COMPLICACIONES  En general, se trata de un procedimiento seguro. Sin embargo, como en cualquier procedimiento, pueden surgir problemas. Estos son algunos posibles problemas:  Infeccin.  Hemorragias.  Ruptura del saco amnitico (membranas).  Comenzar el trabajo de parto y el parto de forma prematura.  Problemas con la anestesia.  Infeccin del saco amnitico. ANTES DEL PROCEDIMIENTO   Consulte a su mdico si debe cambiar o suspender sus medicamentos.  No coma ni beba nada durante las 6 a 8horas previas al procedimiento.  Pdale a alguna persona que la lleve a su casa luego del procedimiento. PROCEDIMIENTO   Se le colocar una sonda intravenosa en una de las venas. Le darn un sedante para que pueda relajarse.  Le aplicarn un medicamento que la har dormir durante el procedimiento (anestesia general) o le inyectarn un medicamento para adormecer la zona de la cintura para abajo (anestesia espinal o anestesia epidural). Usted dormir o tendr el cuerpo adormecido durante todo el procedimiento.  Le colocarn un espculo en la vagina para visualizar el cuello del tero.  Luego el cuello del tero se toma y se sutura de manera  apretada.  Podrn utilizar una ecografa para guiar el procedimiento y controlar al beb. DESPUS DEL PROCEDIMIENTO   La llevarn a una sala de recuperacin y controlarn al beb que an no ha nacido. Una vez que despierte, se encuentre estabilizada y pueda ingerir lquidos sin problemas, podr volver a su habitacin.  Deber permanecer en el hospital durante la noche.  Le aplicarn una inyeccin de progesterona para evitar las contracciones uterinas.  Le darn analgsicos para que tome en su casa.  Pdale a alguna persona que la lleve de vuelta a su casa y que permanezca con usted durante 2 das. Esta informacin no tiene como fin reemplazar el consejo del mdico. Asegrese de hacerle al mdico cualquier pregunta que tenga. Document Released: 01/25/2009 Document Revised: 11/03/2013 Elsevier Interactive Patient Education  2017 Elsevier Inc.  

## 2017-04-12 NOTE — Anesthesia Preprocedure Evaluation (Signed)
Anesthesia Evaluation  Patient identified by MRN, date of birth, ID band Patient awake    Reviewed: Allergy & Precautions, NPO status , Patient's Chart, lab work & pertinent test results, reviewed documented beta blocker date and time   Airway Mallampati: III  TM Distance: >3 FB Neck ROM: Full    Dental no notable dental hx. (+) Teeth Intact   Pulmonary former smoker,    Pulmonary exam normal breath sounds clear to auscultation       Cardiovascular hypertension, Pt. on medications and Pt. on home beta blockers Normal cardiovascular exam Rhythm:Regular Rate:Normal     Neuro/Psych negative neurological ROS  negative psych ROS   GI/Hepatic negative GI ROS, Neg liver ROS,   Endo/Other  Obesity  Renal/GU negative Renal ROS  negative genitourinary   Musculoskeletal negative musculoskeletal ROS (+)   Abdominal (+) + obese,   Peds  Hematology  (+) anemia ,   Anesthesia Other Findings   Reproductive/Obstetrics (+) Pregnancy Previous C/Section Incompetent cervix                             Anesthesia Physical Anesthesia Plan  ASA: II  Anesthesia Plan: Spinal   Post-op Pain Management:    Induction:   Airway Management Planned: Natural Airway  Additional Equipment:   Intra-op Plan:   Post-operative Plan:   Informed Consent: I have reviewed the patients History and Physical, chart, labs and discussed the procedure including the risks, benefits and alternatives for the proposed anesthesia with the patient or authorized representative who has indicated his/her understanding and acceptance.   Dental advisory given  Plan Discussed with: Anesthesiologist, CRNA and Surgeon  Anesthesia Plan Comments:         Anesthesia Quick Evaluation

## 2017-04-12 NOTE — Anesthesia Postprocedure Evaluation (Signed)
Anesthesia Post Note  Patient: Vinia Jemmott  Procedure(s) Performed: Procedure(s) (LRB): CERCLAGE CERVICAL (N/A)     Patient location during evaluation: PACU Anesthesia Type: Spinal Level of consciousness: oriented and awake and alert Pain management: pain level controlled Vital Signs Assessment: post-procedure vital signs reviewed and stable Respiratory status: spontaneous breathing, respiratory function stable and patient connected to nasal cannula oxygen Cardiovascular status: blood pressure returned to baseline and stable Postop Assessment: no headache and no backache Anesthetic complications: no    Last Vitals:  Vitals:   04/12/17 1545 04/12/17 1615  BP: 103/64   Pulse: 60   Resp: (!) 21 (P) 18  Temp:  (P) 36.7 C    Last Pain:  Vitals:   04/12/17 1545  TempSrc:   PainSc: 0-No pain   Pain Goal: Patients Stated Pain Goal: 3 (04/12/17 1445)               Bess Saltzman

## 2017-04-13 ENCOUNTER — Encounter (HOSPITAL_COMMUNITY): Payer: Self-pay | Admitting: Obstetrics and Gynecology

## 2017-04-17 ENCOUNTER — Telehealth (HOSPITAL_COMMUNITY): Payer: Self-pay | Admitting: Genetics

## 2017-04-17 NOTE — Telephone Encounter (Signed)
Called Rayona Season Astacio to discuss her prenatal cell free DNA test results.  Mrs. Glennice Wynelle Fanny had Panorama testing through [facility].  Testing was offered because of an increased risk for Down syndrome based on a first trimester screen.   The patient was identified by name and DOB.  We reviewed that these are within normal limits, showing a less than 1 in 10,000 risk for trisomies 21, 18 and 13, and monosomy X (Turner syndrome).  In addition, the risk for triploidy and sex chromosome trisomies (47,XXX and 47,XXY) was also low risk.   We reviewed that this testing identifies > 99% of pregnancies with trisomy 70, trisomy 30, sex chromosome trisomies (47,XXX and 47,XXY), and triploidy. The detection rate for trisomy 18 is 96%.  The detection rate for monosomy X is ~92%.  The false positive rate is <0.1% for all conditions. Testing was also consistent with female fetal sex.  The patient did wish to know fetal sex.  She understands that this testing does not identify all genetic conditions.  All questions were answered to her satisfaction, she was encouraged to call with additional questions or concerns.  Cam Hai, MS Certified Genetic Counselor

## 2017-04-22 ENCOUNTER — Ambulatory Visit (INDEPENDENT_AMBULATORY_CARE_PROVIDER_SITE_OTHER): Payer: Self-pay | Admitting: Obstetrics & Gynecology

## 2017-04-22 VITALS — BP 112/62 | HR 69 | Wt 204.0 lb

## 2017-04-22 DIAGNOSIS — O0990 Supervision of high risk pregnancy, unspecified, unspecified trimester: Secondary | ICD-10-CM

## 2017-04-22 DIAGNOSIS — O10912 Unspecified pre-existing hypertension complicating pregnancy, second trimester: Secondary | ICD-10-CM

## 2017-04-22 DIAGNOSIS — O0992 Supervision of high risk pregnancy, unspecified, second trimester: Secondary | ICD-10-CM

## 2017-04-22 NOTE — Patient Instructions (Signed)
Segundo trimestre de Media planner (Second Trimester of Pregnancy) El segundo trimestre va desde la semana13 hasta la 64, desde el cuarto hasta el sexto mes, y suele ser el momento en el que mejor se siente. En general, las nuseas matutinas han disminuido o han desaparecido completamente. Tendr ms energa y podr aumentarle el apetito. El beb por nacer (feto) se desarrolla rpidamente. Hacia el final del sexto mes, el beb mide aproximadamente 9 pulgadas (23 cm) y pesa alrededor de 1 libras (700 g). Es probable que sienta al beb moverse (dar pataditas) entre las 18 y 77 semanas del Media planner. CUIDADOS EN EL HOGAR  No fume, no consuma hierbas ni beba alcohol. No tome frmacos que el mdico no haya autorizado.  No consuma ningn producto que contenga tabaco, lo que incluye cigarrillos, tabaco de Higher education careers adviser o Psychologist, sport and exercise. Si necesita ayuda para dejar de fumar, consulte al [employer]. Puede recibir asesoramiento u otro tipo de apoyo para dejar de fumar.  Tome los medicamentos solamente como se lo haya indicado el mdico. Algunos medicamentos son seguros para tomar durante el Media planner y otros no lo son.  Haga ejercicios solamente como se lo haya indicado el mdico. Interrumpa la actividad fsica si comienza a tener calambres.  Ingiera alimentos saludables de [place] regular.  Use un sostn que le brinde buen soporte si sus mamas estn sensibles.  No se d baos de inmersin en agua caliente, baos turcos ni saunas.  Colquese el cinturn de seguridad cuando conduzca.  No coma carne cruda ni queso sin cocinar; evite el contacto con las bandejas sanitarias de los gatos y la tierra que estos animales usan.  Seama.  Tome entre 1500 y 2082m de calcio diariamente comenzando en la [id number] embarazo hMartinsdale  Pruebe tomar un medicamento que la ayude a defecar (un laxante suave) si el mdico lo autoriza. Consuma ms fibra, que se encuentra en las frutas y  verduras frescas y los cereales integrales. Beba suficiente lquido para mantener el pis (orina) claro o de color amarillo plido.  Dese baos de asiento con agua tibia para aBest boyo las molestias causadas por las hemorroides. Use una crema para las hemorroides si el mdico la autoriza.  Si se le hinchan las venas (venas varicosas), use medias de descanso. Levante (eleve) los pies durante 168mutos, 3 o 4veces por daTraining and development officerLimite el consumo de sal en su dieta.  No levante objetos pesados, use zapatos de tacones bajos y sintese derecha.  Descanse con las piernas elevadas si tiene calambres o dolor de cintura.  Visite a su dentista si no lo ha heQuarry managerUse un cepillo de cerdas suaves para cepillarse los dientes. Psese el hilo dental con suavidad.  Puede seguir maAmerican Electric Powera menos que el mdico le indique lo contrario.  Concurra a los controles mdicos.  SOLICITE AYUDA SI:  Siente mareos.  Sufre calambres o presin leves en la parte baja del vientre (abdomen).  Sufre un dolor persistente en el abdomen.  Tiene maHigher education careers advisernuseas), vmitos, o tiene deposiciones acuosas (diarrea).  Advierte un olor ftido que proviene de la vagina.  Siente dolor al orContinental Airlines SOLICITE AYUDA DE INMEDIATO SI:  Tiene fiebre.  Tiene una prdida de lquido por la vagina.  Tiene sangrado o pequeas prdidas vaginales.  Siente dolor intenso o clicos en el abdomen.  Sube o baja de peso rpidamente.  Tiene dificultades para recuperar el aliento y siente dolor en el pecho.  Sbitamente se  le hinchan mucho el rostro, las manos, los tobillos, los pies o las piernas.  No ha sentido los movimientos del beb durante una hora.  Siente un dolor de cabeza intenso que no se alivia con medicamentos.  Su visin se modifica.  Esta informacin no tiene como fin reemplazar el consejo del mdico. Asegrese de hacerle al mdico cualquier pregunta que  tenga. Document Released: 07/01/2013 Document Revised: 11/19/2014 Document Reviewed: 12/30/2012 Elsevier Interactive Patient Education  2017 Elsevier Inc.  

## 2017-04-22 NOTE — Progress Notes (Signed)
Spanish interpreter Earnest Bailey present for visit Breastfeeding tip reviewed

## 2017-04-22 NOTE — Progress Notes (Signed)
   PRENATAL VISIT NOTE  Subjective:  Bonnie Morales is a 36 y.o. G3P1102 at [redacted]w[redacted]d being seen today for ongoing prenatal care.  She is currently monitored for the following issues for this high-risk pregnancy and has Exposure to potential infection; VBAC (vaginal birth after Cesarean); Supervision of high risk pregnancy, antepartum; Chronic hypertension during pregnancy, antepartum; Abnormal first trimester screen; Previous cesarean section complicating pregnancy; History of eclampsia; and [redacted] weeks gestation of pregnancy on her problem list.  Patient reports pelvic pressure.  Contractions: Not present. Vag. Bleeding: None.  Movement: Present. Denies leaking of fluid.   The following portions of the patient's history were reviewed and updated as appropriate: allergies, current medications, past family history, past medical history, past social history, past surgical history and problem list. Problem list updated.  Objective:   Vitals:   04/22/17 1102  BP: 112/62  Pulse: 69  Weight: 204 lb (92.5 kg)    Fetal Status: Fetal Heart Rate (bpm): 150   Movement: Present     General:  Alert, oriented and cooperative. Patient is in no acute distress.  Skin: Skin is warm and dry. No rash noted.   Cardiovascular: Normal heart rate noted  Respiratory: Normal respiratory effort, no problems with respiration noted  Abdomen: Soft, gravid, appropriate for gestational age. Pain/Pressure: Present     Pelvic:  Cervical exam performed        Extremities: Normal range of motion.  Edema: None  Mental Status: Normal mood and affect. Normal behavior. Normal judgment and thought content.   Assessment and Plan:  Pregnancy: [id number] at [redacted]w[redacted]d  There are no diagnoses linked to this encounter. Preterm labor symptoms and general obstetric precautions including but not limited to vaginal bleeding, contractions, leaking of fluid and fetal movement were reviewed in detail with the patient. Please refer to After  Visit Summary for other counseling recommendations.  Return in about 4 weeks (around 05/20/2017). Vaginal [place] in [place]  Emeterio Reeve, MD

## 2017-04-23 ENCOUNTER — Ambulatory Visit (HOSPITAL_COMMUNITY)
Admission: RE | Admit: 2017-04-23 | Discharge: 2017-04-23 | Disposition: A | Discharge status: Home / Self Care | Payer: Self-pay | Source: Ambulatory Visit | Attending: Obstetrics & Gynecology | Admitting: Obstetrics & Gynecology

## 2017-04-23 ENCOUNTER — Encounter (HOSPITAL_COMMUNITY): Payer: Self-pay

## 2017-04-23 ENCOUNTER — Other Ambulatory Visit (HOSPITAL_COMMUNITY): Payer: Self-pay | Admitting: Maternal and Fetal Medicine

## 2017-04-23 DIAGNOSIS — Z3A21 21 weeks gestation of pregnancy: Secondary | ICD-10-CM | POA: Insufficient documentation

## 2017-04-23 DIAGNOSIS — O3432 Maternal care for cervical incompetence, second trimester: Secondary | ICD-10-CM

## 2017-04-23 DIAGNOSIS — O09219 Supervision of pregnancy with history of pre-term labor, unspecified trimester: Principal | ICD-10-CM

## 2017-04-23 DIAGNOSIS — E669 Obesity, unspecified: Secondary | ICD-10-CM | POA: Insufficient documentation

## 2017-04-23 DIAGNOSIS — O09212 Supervision of pregnancy with history of pre-term labor, second trimester: Secondary | ICD-10-CM | POA: Insufficient documentation

## 2017-04-23 DIAGNOSIS — O09292 Supervision of pregnancy with other poor reproductive or obstetric history, second trimester: Secondary | ICD-10-CM | POA: Insufficient documentation

## 2017-04-23 DIAGNOSIS — O132 Gestational [pregnancy-induced] hypertension without significant proteinuria, second trimester: Secondary | ICD-10-CM | POA: Insufficient documentation

## 2017-04-23 DIAGNOSIS — O10012 Pre-existing essential hypertension complicating pregnancy, second trimester: Secondary | ICD-10-CM

## 2017-04-23 DIAGNOSIS — O09899 Supervision of other high risk pregnancies, unspecified trimester: Secondary | ICD-10-CM

## 2017-04-23 DIAGNOSIS — O99212 Obesity complicating pregnancy, second trimester: Secondary | ICD-10-CM | POA: Insufficient documentation

## 2017-04-26 ENCOUNTER — Inpatient Hospital Stay (HOSPITAL_COMMUNITY): Payer: Self-pay

## 2017-04-26 ENCOUNTER — Inpatient Hospital Stay (HOSPITAL_COMMUNITY)
Admission: AD | Admit: 2017-04-26 | Discharge: 2017-04-26 | Disposition: A | Discharge status: Home / Self Care | Payer: Self-pay | Source: Ambulatory Visit | Attending: Obstetrics & Gynecology | Admitting: Obstetrics & Gynecology

## 2017-04-26 ENCOUNTER — Encounter (HOSPITAL_COMMUNITY): Payer: Self-pay | Admitting: *Deleted

## 2017-04-26 DIAGNOSIS — O3432 Maternal care for cervical incompetence, second trimester: Secondary | ICD-10-CM | POA: Insufficient documentation

## 2017-04-26 DIAGNOSIS — O4692 Antepartum hemorrhage, unspecified, second trimester: Secondary | ICD-10-CM | POA: Insufficient documentation

## 2017-04-26 DIAGNOSIS — O34219 Maternal care for unspecified type scar from previous cesarean delivery: Secondary | ICD-10-CM | POA: Insufficient documentation

## 2017-04-26 DIAGNOSIS — O289 Unspecified abnormal findings on antenatal screening of mother: Secondary | ICD-10-CM | POA: Insufficient documentation

## 2017-04-26 DIAGNOSIS — O4702 False labor before 37 completed weeks of gestation, second trimester: Secondary | ICD-10-CM

## 2017-04-26 DIAGNOSIS — O99212 Obesity complicating pregnancy, second trimester: Secondary | ICD-10-CM | POA: Insufficient documentation

## 2017-04-26 DIAGNOSIS — Z87891 Personal history of nicotine dependence: Secondary | ICD-10-CM | POA: Insufficient documentation

## 2017-04-26 DIAGNOSIS — Z3A21 21 weeks gestation of pregnancy: Secondary | ICD-10-CM | POA: Insufficient documentation

## 2017-04-26 DIAGNOSIS — Z87442 Personal history of urinary calculi: Secondary | ICD-10-CM | POA: Insufficient documentation

## 2017-04-26 DIAGNOSIS — E669 Obesity, unspecified: Secondary | ICD-10-CM | POA: Insufficient documentation

## 2017-04-26 DIAGNOSIS — O162 Unspecified maternal hypertension, second trimester: Secondary | ICD-10-CM | POA: Insufficient documentation

## 2017-04-26 DIAGNOSIS — O09212 Supervision of pregnancy with history of pre-term labor, second trimester: Secondary | ICD-10-CM | POA: Insufficient documentation

## 2017-04-26 LAB — URINALYSIS, ROUTINE W REFLEX MICROSCOPIC
Bilirubin Urine: NEGATIVE
Glucose, UA: NEGATIVE mg/dL
Ketones, ur: NEGATIVE mg/dL
Nitrite: NEGATIVE
PH: 6 (ref 5.0–8.0)
Protein, ur: NEGATIVE mg/dL
SPECIFIC GRAVITY, URINE: 1.012 (ref 1.005–1.030)

## 2017-04-26 NOTE — MAU Provider Note (Signed)
History     CSN: [id number]  Arrival date and time: 04/26/17 6962   First Provider Initiated Contact with Patient 04/26/17 0710      Chief Complaint  Patient presents with  . Vaginal Bleeding   Bonnie Morales is a 36 y.o. [id number] at [redacted]w[redacted]d who presents today with vaginal bleeding. She had a cerclage placed on 04/12/17 for shortened cervix. She has not had any complications since then, until this morning she started to have bleeding.    Vaginal Bleeding  The patient's primary symptoms include pelvic pain and vaginal bleeding. This is a new problem. The current episode started today (around 0600). The problem occurs constantly. The problem has been unchanged. The pain is mild (2/10). The problem affects both sides. She is pregnant. Pertinent negatives include no chills, dysuria, fever, frequency, nausea, urgency or vomiting. The vaginal discharge was bloody. The vaginal bleeding is lighter than menses. She has been passing clots (small about the size of a grape ). She has not been passing tissue. Nothing aggravates the symptoms. She has tried nothing for the symptoms.    Past Medical History:  Diagnosis Date  . History of kidney stones    passed stone  . Hypertension    2014  . Pre-eclampsia   . SVD (spontaneous vaginal delivery) 2016   x 1 Bonnie Morales    Past Surgical History:  Procedure Laterality Date  . CERVICAL CERCLAGE N/A 04/12/2017   Procedure: CERCLAGE CERVICAL;  Surgeon: Chancy Milroy, MD;  Location: [facility];  Service: Gynecology;  Laterality: N/A;  . CESAREAN SECTION     epidural - in [place]    Family History  Problem Relation Age of Onset  . Diabetes Mother   . Hypertension Mother   . Heart disease Mother        tachycardia  . Hypertension Father     Social History  Substance Use Topics  . Smoking status: Former Smoker    Packs/day: 0.25    Years: 5.00    Types: Cigarettes    Quit date: 09/12/2016  . Smokeless tobacco: Never Used  . Alcohol use No     Allergies:  Allergies  Allergen Reactions  . Sulfa Antibiotics Hives    Reports high fever and red rash on arms    Prescriptions Prior to Admission  Medication Sig Dispense Refill Last Dose  . aspirin 81 MG tablet Take 81 mg by mouth daily.   Taking  . labetalol (NORMODYNE) 100 MG tablet Take 100 mg by mouth 2 (two) times daily.   Taking  . Prenatal Vit-Fe Fumarate-FA (PRENATAL MULTIVITAMIN) TABS tablet Take 1 tablet by mouth daily at 12 noon.   Taking  . progesterone (PROMETRIUM) 200 MG capsule Place 200 mg vaginally at bedtime.   Taking    Review of Systems  Constitutional: Negative for chills and fever.  Gastrointestinal: Negative for nausea and vomiting.  Genitourinary: Positive for pelvic pain and vaginal bleeding. Negative for dysuria, frequency and urgency.   Physical Exam   Blood pressure (!) 121/55, pulse 62, temperature 98.4 F (36.9 C), temperature source Oral, resp. rate 20, height 5\' 5"  (1.651 m), weight 203 lb (92.1 kg), last menstrual period 11/27/2016, unknown if currently breastfeeding.  Physical Exam  Nursing note and vitals reviewed. Constitutional: She is oriented to person, place, and time. She appears well-developed and well-nourished. No distress.  HENT:  Head: Normocephalic.  Cardiovascular: Normal rate.   Respiratory: Effort normal.  GI: Soft. There is no tenderness. There is  no rebound.  Genitourinary:  Genitourinary Comments:  External: no lesion Vagina: small amount of blood seen. One small clot in the vagina.  Cervix: pink, smooth, suture visible. Closed/thick Uterus: AGA, FHT 144 with doppler   Neurological: She is alert and oriented to person, place, and time.  Skin: Skin is warm and dry.  Psychiatric: She has a normal mood and affect.   Results for orders placed or performed during the hospital encounter of 04/26/17 (from the past 24 hour(s))  Urinalysis, Routine w reflex microscopic     Status: Abnormal   Collection Time: 04/26/17   6:51 AM  Result Value Ref Range   Color, Urine YELLOW YELLOW   APPearance HAZY (A) CLEAR   Specific Gravity, Urine 1.012 1.005 - 1.030   pH 6.0 5.0 - 8.0   Glucose, UA NEGATIVE NEGATIVE mg/dL   Hgb urine dipstick LARGE (A) NEGATIVE   Bilirubin Urine NEGATIVE NEGATIVE   Ketones, ur NEGATIVE NEGATIVE mg/dL   Protein, ur NEGATIVE NEGATIVE mg/dL   Nitrite NEGATIVE NEGATIVE   Leukocytes, UA TRACE (A) NEGATIVE   RBC / HPF 6-30 0 - 5 RBC/hpf   WBC, UA 0-5 0 - 5 WBC/hpf   Bacteria, UA RARE (A) NONE SEEN   Squamous Epithelial / LPF 0-5 (A) NONE SEEN   Mucous PRESENT    MAU Course  Procedures  None  MDM Patient is in [place] 0800: care turned over to Bonnie Oiler, Bonnie Morales Bonnie Morales 8:04 AM 04/26/17   US shows cervical length 3cm Bleeding very minimal at this time. Patient is without pain.  O positive blood type  + fetal heart tones   Assessment and Plan   A:  1. Threatened premature labor in second trimester   2. Vaginal bleeding in pregnancy, second trimester   3. [redacted] weeks gestation of pregnancy   4. Cervical cerclage suture present in second trimester     P:  Spanish interpretor used for discharge instructions.  Discharge home in stable condition Strict return precautions Bleeding precautions Pelvic rest Continue Prometrium   Leny Morozov, Bonnie Malta Morales, Bonnie Morales 04/26/2017 11:28 AM

## 2017-04-26 NOTE — MAU Note (Signed)
Pt  Says vag bleeding started at 0600-  In triage-  Small  Quarter size  Spot  Of  Dark red.  On pad.   Denies  Cramping  .  No sex tonight    She  Has circlage  In .

## 2017-04-26 NOTE — Discharge Instructions (Signed)
Insuficiencia cervical (Cervical Insufficiency) Se llama insuficiencia cervical cuando el cuello del tero se debilita y comienza a abrirse (dilatarse) y a Electrical engineer ms delgado (borrarse) antes de que Water quality scientist llegue a trmino y sin que se inicie el trabajo de [place]. Tambin se llama crvix incompetente. Puede ocurrir en el segundo o tercer trimestre del [place], cuando el feto comienza a presionar sobre el cuello del tero. Puede ser causa de aborto, de rotura prematura de las membranas pretrmino (RPMP), o un nacimiento prematuro (nacimiento pretrmino). FACTORES DE RIESGO Tendr ms riesgo de sufrir insuficiencia cervical si:  El cuello del tero es ms corto que lo normal.  Ha sufrido un dao o lesin en el cuello del tero en un embarazo o ciruga anteriores.  Ha nacido con un defecto en el cuello del tero.  Le han realizado algn procedimiento en el cuello del tero, como una biopsia.  Tiene una historia de insuficiencia cervical.  Tiene una historia de RPMP.  Ha interrumpido varios embarazos anteriores con un aborto.  Ha estado expuesta a la droga dietiletilbestrol (DES). SNTOMAS Muchas veces la mujer no presenta sntomas. Otras veces hay sntomas leves que generalmente comienzan entre la semana 14 y la 20 del Media planner. [place]. Estos sntomas son:  Jene Every o sangrado por la vagina.  Presin en la pelvis.  Un cambio en el aspecto en el flujo vaginal, como una secrecin que cambia de ser clara, blanca o amarillo claro a rosada o marrn.  Dolor de espalda.  Dolor o clicos abdominales. DIAGNSTICO La insuficiencia cervical no puede diagnosticarse antes de quedar embarazada. Cuando est embarazada, el mdico le har una historia clnica y preguntar si ha sufrido problemas en embarazos anteriores. Informe a su mdico acerca de todo procedimiento que le hayan practicado en el cuello del tero, o si tiene historia de abortos  espontneos o de insuficiencia cervical. Si el mdico considera que tiene alto riesgo de sufrir insuficiencia cervical o presenta sntomas de insuficiencia cervical, podr indicarle:  Realizar un examen plvico Durante el mismo verificar: ? Si hay membranas (saco amnitico) saliendo por el cuello del tero. ? Anormalidades en el cuello del tero. ? Lesiones en el cuello del tero. ? La presencia de contracciones.  Realizar una ecografa para medir el largo y el grosor del cuello del tero. TRATAMIENTO Si ha sido diagnosticada con insuficiencia cervical, el mdico podr indicar:  Limitar la actividad fsica.  Hacer reposo en cama, en su casa o en el hospital.  Hacer reposo plvico, lo que significa no tener relaciones sexuales no colocar nada dentro de la vagina.  Un cerclaje en el que se sutura el cuello del tero para cerrarlo e impedir que se abra anticipadamente. Los puntos (suturas) se retiran entre la semana 31 y la 59 para evitar problemas durante el [place]. El cerclaje puede recomendarse durante el embarazo si usted tiene una historia de abortos espontneos o partos prematuros sin causa aparente. Tambin puede indicarse si tiene un cuello uterino corto, diagnosticado con una ecografa o si el mdico ha hallado que su cuello se ha dilatado antes de las 24 semanas del [place]. Limitar la actividad fsica y Field seismologist reposo en cama puede ayudar o no a impedir un Environmental education officer. [place]? Solicite atencin mdica de inmediato si tiene sntomas de insuficiencia cervical. Debe concurrir al hospital para ser controlada inmediatamente. Esta informacin no tiene Marine scientist el consejo del mdico. Asegrese de hacerle al  mdico cualquier pregunta que tenga. Document Released: 10/29/2005 Document Revised: 11/19/2014 Document Reviewed: 01/05/2013 Elsevier Interactive Patient Education  2017 [place] vaginal durante el  embarazo (segundo trimestre) (Vaginal Bleeding During Pregnancy, Second Trimester) Durante el embarazo, es comn tener una pequea hemorragia vaginal (manchas). A veces, la hemorragia es normal y no representa un problema, pero en algunas ocasiones es un sntoma de algo grave. Asegrese de decirle a su mdico de inmediato si tiene algn tipo de hemorragia vaginal. CUIDADOS EN EL HOGAR  Controle su afeccin para ver si hay cambios.  Siga las indicaciones de su mdico con respecto al [place] de actividad que [place].  Si debe hacer reposo en cama: ? Es posible que deba quedarse en cama y levantarse nicamente para ir al bao. ? Building control surveyor [employer]. ? Si es necesario, planifique que alguien la ayude.  Alla [place]: ? La cantidad de toallas higinicas que [place] cada da. ? La frecuencia con la que se cambia las toallas higinicas. ? Indique que tan empapados (saturados) estn.  No use tampones.  No se haga duchas vaginales.  No tenga relaciones sexuales ni orgasmos hasta que el mdico la autorice.  Si elimina tejido por la vagina, gurdelo para mostrrselo al [employer].  Tome los medicamentos solamente como se lo haya indicado el mdico.  No tome aspirina, ya que puede causar hemorragias.  No haga ejercicios, no levante objetos pesados ni haga ninguna actividad que exija mucha energa y esfuerzo, salvo que su mdico la autorice.  Concurra a todas las visitas de control como se lo haya indicado el mdico. SOLICITE AYUDA SI:  Tiene una hemorragia vaginal.  Tiene clicos.  Tiene dolores de [place].  Tiene fiebre que no desaparece despus de Geophysical data processor. SOLICITE AYUDA DE INMEDIATO SI:  Siente clicos muy intensos en la espalda o en el vientre (abdomen).  Siente contracciones.  Tiene escalofros.  Elimina cogulos grandes o tejido por la vagina.  Tiene ms hemorragia.  Se siente dbil o que va a desvanecerse.  Pierde el conocimiento (se  desmaya).  Tiene una prdida importante o sale lquido a borbotones por la vagina. ASEGRESE DE QUE:  Comprende estas instrucciones.  Controlar su afeccin.  Recibir ayuda de inmediato si no mejora o si empeora. Esta informacin no tiene Marine scientist el consejo del mdico. Asegrese de hacerle al mdico cualquier pregunta que tenga. Document Released: 03/15/2014 Document Revised: 03/15/2014 Document Reviewed: 07/06/2013 Elsevier Interactive Patient Education  [employer].

## 2017-04-30 ENCOUNTER — Inpatient Hospital Stay (HOSPITAL_COMMUNITY)
Admission: AD | Admit: 2017-04-30 | Discharge: 2017-04-30 | Disposition: A | Discharge status: Home / Self Care | Payer: Self-pay | Source: Ambulatory Visit | Attending: Family Medicine | Admitting: Family Medicine

## 2017-04-30 ENCOUNTER — Inpatient Hospital Stay (HOSPITAL_COMMUNITY): Payer: Self-pay

## 2017-04-30 ENCOUNTER — Encounter (HOSPITAL_COMMUNITY): Payer: Self-pay

## 2017-04-30 DIAGNOSIS — O3432 Maternal care for cervical incompetence, second trimester: Secondary | ICD-10-CM

## 2017-04-30 DIAGNOSIS — O4690 Antepartum hemorrhage, unspecified, unspecified trimester: Secondary | ICD-10-CM

## 2017-04-30 DIAGNOSIS — Z87891 Personal history of nicotine dependence: Secondary | ICD-10-CM | POA: Insufficient documentation

## 2017-04-30 DIAGNOSIS — O4692 Antepartum hemorrhage, unspecified, second trimester: Secondary | ICD-10-CM

## 2017-04-30 DIAGNOSIS — Z7982 Long term (current) use of aspirin: Secondary | ICD-10-CM | POA: Insufficient documentation

## 2017-04-30 DIAGNOSIS — Z3A22 22 weeks gestation of pregnancy: Secondary | ICD-10-CM

## 2017-04-30 DIAGNOSIS — O162 Unspecified maternal hypertension, second trimester: Secondary | ICD-10-CM | POA: Insufficient documentation

## 2017-04-30 LAB — URINALYSIS, ROUTINE W REFLEX MICROSCOPIC

## 2017-04-30 LAB — CBC
HEMATOCRIT: 32.6 % — AB (ref 36.0–46.0)
Hemoglobin: 11.3 g/dL — ABNORMAL LOW (ref 12.0–15.0)
MCH: 30.4 pg (ref 26.0–34.0)
MCHC: 34.7 g/dL (ref 30.0–36.0)
MCV: 87.6 fL (ref 78.0–100.0)
PLATELETS: 244 10*3/uL (ref 150–400)
RBC: 3.72 MIL/uL — ABNORMAL LOW (ref 3.87–5.11)
RDW: 14.2 % (ref 11.5–15.5)
WBC: 7.8 10*3/uL (ref 4.0–10.5)

## 2017-04-30 LAB — URINALYSIS, MICROSCOPIC (REFLEX): WBC, UA: NONE SEEN WBC/hpf (ref 0–5)

## 2017-04-30 NOTE — MAU Provider Note (Signed)
History     CSN: [id number]  Arrival date and time: 04/30/17 0602   First Provider Initiated Contact with Patient 04/30/17 [phone]      Chief Complaint  Patient presents with  . Vaginal Bleeding   Bonnie Morales is a 36 y.o. G3P1102 at [redacted]w[redacted]d who presents today with vaginal bleeding. She was seen for this complaint on 04/25/17. She states that the bleeding stopped after that visit, but returned again today around 0600. She has had a cerclage placed. She reports some pain/cramping. She has been feeling fetal movement.    Vaginal Bleeding  The patient's primary symptoms include pelvic pain and vaginal bleeding. This is a new problem. The current episode started today (around 0600). The problem occurs constantly. The problem has been unchanged. Pain severity now: 4/10. The problem affects the left side. She is pregnant. Associated symptoms include nausea. Pertinent negatives include no chills, dysuria, fever, frequency, urgency or vomiting. The vaginal discharge was bloody. The vaginal bleeding is typical of menses. She has been passing clots (2 clots about the size of lemon ). Nothing aggravates the symptoms. Treatments tried: taking progesterone vaginally.  Sexual activity: No intercourse in the last 24 hours.    Past Medical History:  Diagnosis Date  . History of kidney stones    passed stone  . Hypertension    2014  . Pre-eclampsia   . SVD (spontaneous vaginal delivery) 2016   x 1 [place]    Past Surgical History:  Procedure Laterality Date  . CERVICAL CERCLAGE N/A 04/12/2017   Procedure: CERCLAGE CERVICAL;  Surgeon: Chancy Milroy, MD;  Location: [facility];  Service: Gynecology;  Laterality: N/A;  . CESAREAN SECTION     epidural - in [place]    Family History  Problem Relation Age of Onset  . Diabetes Mother   . Hypertension Mother   . Heart disease Mother        tachycardia  . Hypertension Father     Social History  Substance Use Topics  . Smoking status: Former Smoker     Packs/day: 0.25    Years: 5.00    Types: Cigarettes    Quit date: 09/12/2016  . Smokeless tobacco: Never Used  . Alcohol use No    Allergies:  Allergies  Allergen Reactions  . Sulfa Antibiotics Hives    Reports high fever and red rash on arms    Prescriptions Prior to Admission  Medication Sig Dispense Refill Last Dose  . aspirin 81 MG tablet Take 81 mg by mouth daily.   04/29/2017 at Unknown time  . labetalol (NORMODYNE) 100 MG tablet Take 100 mg by mouth 2 (two) times daily.   04/29/2017 at Unknown time  . Prenatal Vit-Fe Fumarate-FA (PRENATAL MULTIVITAMIN) TABS tablet Take 1 tablet by mouth daily at 12 noon.   04/29/2017 at Unknown time  . progesterone (PROMETRIUM) 200 MG capsule Place 200 mg vaginally at bedtime.   04/29/2017 at Unknown time    Review of Systems  Constitutional: Negative for chills and fever.  Gastrointestinal: Positive for nausea. Negative for vomiting.  Genitourinary: Positive for pelvic pain and vaginal bleeding. Negative for dysuria, frequency and urgency.   Physical Exam   Blood pressure 130/62, pulse 75, temperature 97.7 F (36.5 C), temperature source Oral, resp. rate 20, last menstrual period 11/27/2016, SpO2 98 %, unknown if currently breastfeeding.  Physical Exam  Nursing note and vitals reviewed. Constitutional: She is oriented to person, place, and time. She appears well-developed and well-nourished. No distress.  HENT:  Head: Normocephalic.  Cardiovascular: Normal rate.   Respiratory: Effort normal.  GI: Soft. There is no tenderness. There is no rebound.  Genitourinary:  Genitourinary Comments:  External: no lesion Vagina: small amount of blood seen  Cervix: pink, smooth, closed/thick  Uterus: AGA, FHT 147 with doppler    Neurological: She is alert and oriented to person, place, and time.  Skin: Skin is warm and dry.  Psychiatric: She has a normal mood and affect.   Results for orders placed or performed during the hospital encounter of  04/30/17 (from the past 24 hour(s))  CBC     Status: Abnormal   Collection Time: 04/30/17  7:03 AM  Result Value Ref Range   WBC 7.8 4.0 - 10.5 K/uL   RBC 3.72 (L) 3.87 - 5.11 MIL/uL   Hemoglobin 11.3 (L) 12.0 - 15.0 g/dL   HCT 32.6 (L) 36.0 - 46.0 %   MCV 87.6 78.0 - 100.0 fL   MCH 30.4 26.0 - 34.0 pg   MCHC 34.7 30.0 - 36.0 g/dL   RDW 14.2 11.5 - 15.5 %   Platelets 244 150 - 400 K/uL    MAU Course  Procedures  MDM 0805: Care turned over to V. Tamala Julian, CNM Bonnie Morales 8:06 AM 04/30/17   Cervix has shorten from 3 cm last week Without funneling to 2 cm today with funneling. Discussed history, exam, ultrasound with Dr. Nehemiah Settle. Will obtain ultrasound for estimated fetal weight and continue to observe bleeding to determine plan of care.  Scant bleeding while in maternity admissions. Estimated fetal weight 435 g. Consulted with Dr. Lucia Gaskins, MFM. Admission not warranted at this time. To early for betamethasone or other interventions. Recommends discharge home. Reviewed ultrasound findings and plan of care with patient. Live interpreter at bedside. Patient verbalizes understanding.  [place] Mfm Ob Transvaginal  Result Date: 04/30/2017 ----------------------------------------------------------------------  OBSTETRICS REPORT                      (Signed Final 04/30/2017 09:06 am) ---------------------------------------------------------------------- Patient Info  ID #:       [id number]                         D.O.B.:   May 06, 1981 (35 yrs)  Name:       Bonnie Morales            Visit Date:  04/30/2017 08:00 am ---------------------------------------------------------------------- Performed By  Performed By:     Jacob Moores BS,       Secondary Phy.:   MAU Nursing-                    RDMS, RVT                                                             MAU/Triage  Attending:        Kerry Kass        Tertiary Phy.:    Clydene Pugh                    MD  HOGAN CNM  Referred By:      [facility]       Address:          [place] for                    [place],                                                             [place] [address]  Ref. Address:     [facility]       Location:         [facility]                    [place]                    [place], [place] ---------------------------------------------------------------------- Orders   #  Description                                 Code   1  [place] MFM [facility] TRANSVAGINAL                      54627.0  ----------------------------------------------------------------------   #  Ordered By               Order #        Accession #    Episode #   1  Bonnie Morales            [id number]      [id number]     [id number]  ---------------------------------------------------------------------- Indications   [redacted] weeks gestation of pregnancy                Z3A.22   Hypertension - Chronic/Pre-existing            O10.019   (labetalol)   Poor obstetric history: Previous               O09.299   preeclampsia / eclampsia/gestational HTN   Previous cesarean delivery, antepartum         Z02.585   Obesity complicating pregnancy(BMI 34)         O99.210 E66.9   Poor obstetric history: Previous preterm  O09.219   delivery(36wks)   Cervical cerclage suture present, second       O34.32   trimester (placed 04/12/17)   Abnormal biochemical screen (low risk          O28.9   Panorama)   Vaginal bleeding in pregnancy, second          O46.92   trimester  ---------------------------------------------------------------------- OB History  Blood Type:            Height:  5'5"   Weight (lb):  205      BMI:   34.11  Gravidity:    3         Term:   1        Prem:   1  Living:       2  ---------------------------------------------------------------------- Fetal Evaluation  Num Of Fetuses:     1  Fetal Heart         140  Rate(bpm):  Cardiac Activity:   Observed  Presentation:       Transverse, head to maternal left  Placenta:           Anterior, above cervical os  P. Cord Insertion:  Previously Visualized  Amniotic Fluid  AFI FV:      Subjectively within normal limits                              Largest Pocket(cm)                              5.8 ---------------------------------------------------------------------- Gestational Age  LMP:           22w 0d       Date:   11/27/16                 EDD:   09/03/17  Best:          Kathrene Bongo 0d    Det. By:   LMP  (11/27/16)          EDD:   09/03/17 ---------------------------------------------------------------------- Cervix Uterus Adnexa  Cervix  Length:              2  cm.    Funnel Length:      1.7  cm.  Funnel Width:      1.4  cm.  Measured transvaginally.  Funneling of internal os noted.  Cerclage  visualized.  Uterus  No abnormality visualized.  Left Ovary  Size(cm)     3.05  x    3.94   x  1.88      Vol(ml): 11.8  Within normal limits. No adnexal mass visualized.  Right Ovary  Size(cm)     2.88  x    2.73   x  1.65      Vol(ml): 6.8  Within normal limits. No adnexal mass visualized.  Cul De Sac:   No free fluid seen.  Adnexa:       No abnormality visualized. ---------------------------------------------------------------------- Impression  Single IUP at 22w 0d  Limited ultrasound performed for cervical length  TVUS - cervical length 2 cm. Some V-shaped funneling is  noted at the internal os.  Cerclage appears intact. ---------------------------------------------------------------------- Recommendations  Follow-up ultrasounds as clinically indicated. ----------------------------------------------------------------------                Kerry Kass, MD Electronically Signed Final Report   04/30/2017 09:06 am  ----------------------------------------------------------------------  [place] Mfm Ob Follow Up  Result Date: 04/30/2017 ----------------------------------------------------------------------  OBSTETRICS REPORT                      (Signed Final 04/30/2017 11:45 am) ---------------------------------------------------------------------- Patient Info  ID #:       [id number]                         D.O.B.:   03-Dec-1980 (35 yrs)  Name:       Bonnie Morales            Visit Date:  04/30/2017 10:18 am ---------------------------------------------------------------------- Performed By  Performed By:     Jacob Moores BS,       Secondary Phy.:   MAU Nursing-                    RDMS, RVT                                                             MAU/Triage  Attending:        Kerry Kass        Tertiary Phy.:    Manya Silvas                    MD                                                             CNM  Referred By:      [facility]       Address:          [place] for                    [place]                                                             [place]  Ref. Address:  [facility]       Location:         [facility]                    [place]                    [place], [employer] ---------------------------------------------------------------------- Orders   #  Description                                 Code   1  [place] MFM OB FOLLOW UP                         51025.85  ----------------------------------------------------------------------   #  Ordered By               Order #        Accession #    Episode #   1  Manya Silvas           [id number]      [id number]     [id number]   ---------------------------------------------------------------------- Indications   [redacted] weeks gestation of pregnancy                Z3A.22   Encounter for other antenatal screening        Z36.2   follow-up   Hypertension - Chronic/Pre-existing            O10.019   (labetalol)   Poor obstetric history: Previous               O09.299   preeclampsia / eclampsia/gestational HTN   Previous cesarean delivery, antepartum         D32.671   Obesity complicating pregnancy(BMI 34)         O99.210 E66.9   Poor obstetric history: Previous preterm       O09.219   delivery(36wks)   Cervical cerclage suture present, second       O34.32   trimester (placed 04/12/17)   Abnormal biochemical screen (low risk          O28.9   Panorama)   Vaginal bleeding in pregnancy, second          O46.92   trimester  ---------------------------------------------------------------------- OB History  Blood Type:            Height:  5'5"   Weight (lb):  205      BMI:   34.11  Gravidity:    3         Term:   1        Prem:   1  Living:       2 ---------------------------------------------------------------------- Fetal Evaluation  Num Of Fetuses:     1  Fetal Heart         144  Rate(bpm):  Cardiac Activity:   Observed  Presentation:       Cephalic  Placenta:           Anterior, above cervical os  P. Cord Insertion:  Previously Visualized  Amniotic Fluid  AFI FV:      Subjectively within normal limits                              Largest Pocket(cm)                              5.8 ---------------------------------------------------------------------- Biometry  BPD:      50.2  mm     G. Age:  21w 1d         18  %    CI:        76.16   %   70 - 86                                                          FL/HC:      19.9   %   18.4 - 20.2  HC:      182.3  mm     G. Age:  20w 4d        < 3  %    HC/AC:      1.08       1.06 - 1.25  AC:      168.5  mm     G. Age:  21w 6d         38  %    FL/BPD:     72.3   %   71 - 87  FL:       36.3  mm     G. Age:  21w 4d          24  %    FL/AC:      21.5   %   20 - 24  HUM:      33.8  mm     G. Age:  21w 4d         37  %  CER:      22.7  mm     G. Age:  21w 2d         31  %  CM:        5.4  mm  Est. FW:     434  gm    0 lb 15 oz      34  % ---------------------------------------------------------------------- Gestational Age  LMP:           22w 0d       Date:   11/27/16                 EDD:   09/03/17  U/S Today:     21w 2d                                        EDD:   09/08/17  Best:          22w 0d    Det. By:   LMP  (11/27/16)          EDD:   09/03/17 ---------------------------------------------------------------------- Anatomy  Cranium:               Appears normal  Aortic Arch:            Appears normal  Cavum:                 Appears normal         Ductal Arch:            Appears normal  Ventricles:            Appears normal         Diaphragm:              Appears normal  Choroid Plexus:        Previously seen        Stomach:                Appears normal, left                                                                        sided  Cerebellum:            Appears normal         Abdomen:                Appears normal  Posterior Fossa:       Appears normal         Abdominal Wall:         Appears nml (cord                                                                        insert, abd wall)  Nuchal Fold:           Previously seen        Cord Vessels:           Previously seen  Face:                  Appears normal         Kidneys:                Appear normal                         (orbits and profile)  Lips:                  Appears normal         Bladder:                Appears normal  Thoracic:              Appears normal         Spine:                  Previously seen  Heart:                 Appears normal         Upper Extremities:      Previously seen                         (  4CH, axis, and situs  RVOT:                  Appears normal         Lower Extremities:      Previously seen  LVOT:                  Appears  normal  Other:  Female gender previously seen..Technically difficult due to maternal          habitus and fetal position. ---------------------------------------------------------------------- Cervix Uterus Adnexa  Cervix  Length:              2  cm.    Funnel Length:      1.7  cm.  Funnel Width:      1.4  cm.  Cerclage vis. with Funneling of internal os noted earlier today on  previous tranvaginal [place].  Uterus  No abnormality visualized.  Left Ovary  Not visualized. Imaged on previous ultrasound earlier today.  Right Ovary  Not visualized. Imaged on previous ultrasound earlier today  Cul De Sac:   No free fluid seen.  Adnexa:       No abnormality visualized. ---------------------------------------------------------------------- Impression  Single IUP at 22w 0d  Normal interval anatomy  The estimated fetal weight is 434 g (34th %tile)  Anterior placenta without previa  Normal amniotic fluid volume ---------------------------------------------------------------------- Recommendations  Follow-up ultrasounds as clinically indicated. ----------------------------------------------------------------------                Kerry Kass, MD Electronically Signed Final Report   04/30/2017 11:45 am ----------------------------------------------------------------------    Assessment and Plan   1. Cervical insufficiency during pregnancy in second trimester, antepartum   2. [redacted] weeks gestation of pregnancy   3. Vaginal bleeding in pregnancy, second trimester   4. Vaginal bleeding in pregnancy    Discharge home in stable condition per consult with Dr. Nehemiah Settle and Dr. Lucia Gaskins. Bleeding precautions. Pelvic rest until further notice. Ibuprofen when necessary max 48 hours. Continue vaginal progesterone. Follow-up [facility] Follow up.   Specialty:  Radiology Why:  as scheduled. May call to reschedule appointment due to [place] today.  Contact information: [address] [phone] [place] Follow up.   Why:  if symptoms worsen Contact information: [address] [phone] [place] [phone]         Allergies as of 04/30/2017      Reactions   Sulfa Antibiotics Hives   Reports high fever and red rash on arms      Medication List    TAKE these medications   aspirin 81 MG tablet Take 81 mg by mouth daily.   labetalol 100 MG tablet Commonly known as:  NORMODYNE Take 100 mg by mouth 2 (two) times daily.   prenatal multivitamin Tabs tablet Take 1 tablet by mouth daily at 12 noon.   progesterone 200 MG capsule Commonly known as:  PROMETRIUM Place 200 mg vaginally at bedtime.        Tamala Julian, [place], [place] 04/30/2017 12:19 PM

## 2017-04-30 NOTE — Discharge Instructions (Signed)
Reposo plvico (Pelvic Rest) CUNDO SE RECOMIENDA EL REPOSO PLVICO? El reposo plvico puede recomendarse en los siguientes casos:  La placenta cubre de forma parcial o total la abertura del cuello del tero (placenta previa).  Hay sangrado entre la pared del tero y el saco amnitico en el primer trimestre de Media planner (hemorragia subcorinica).  El [place] de parto comienza muy pronto (trabajo de parto prematuro). Segn la salud general de la madre y el feto, el mdico decidir si el reposo plvico es [place]. [place]? Durante el tiempo que le indique el mdico:  No tenga relaciones sexuales, estimulacin sexual ni orgasmos.  No use tampones. No se haga duchas vaginales. No se introduzca nada en la vagina.  No levante ningn objeto que pese ms de 10libras (4,5kg).  Evite las actividades que demanden mucho esfuerzo (extenuantes).  Evite las actividades que requieran esfuerzos de los msculos de la pelvis. [place]? Solicite atencin mdica de inmediato si:  Tiene clicos en la zona inferior del abdomen.  Tiene secrecin de flujo vaginal.  Tiene un dolor sordo en la parte baja de la espalda.  Tiene contracciones regulares.  Tienen tensin uterina. [place]? Solicite atencin mdica de inmediato si:  Tiene sangrado vaginal y est embarazada. Esta informacin no tiene Marine scientist el consejo del mdico. Asegrese de hacerle al mdico cualquier pregunta que tenga. Document Released: 07/23/2012 Document Revised: 02/20/2016 Document Reviewed: 05/02/2015 Elsevier Interactive Patient Education  [employer].

## 2017-04-30 NOTE — MAU Note (Signed)
Pt here for vaginal bleeding that started at 0550 this morning and noticed two small pieces of tissue. States she has some lower abdominal cramping-rates 4/10. States she has not taken anything for pain. Has a cerclage placed in beginning of June

## 2017-05-01 ENCOUNTER — Encounter: Payer: Self-pay | Admitting: *Deleted

## 2017-05-07 ENCOUNTER — Ambulatory Visit (HOSPITAL_COMMUNITY): Payer: Self-pay

## 2017-05-08 ENCOUNTER — Other Ambulatory Visit (HOSPITAL_COMMUNITY): Payer: Self-pay | Admitting: *Deleted

## 2017-05-08 ENCOUNTER — Ambulatory Visit (HOSPITAL_COMMUNITY)
Admission: RE | Admit: 2017-05-08 | Discharge: 2017-05-08 | Disposition: A | Discharge status: Home / Self Care | Payer: Self-pay | Source: Ambulatory Visit | Attending: Obstetrics & Gynecology | Admitting: Obstetrics & Gynecology

## 2017-05-08 ENCOUNTER — Encounter (HOSPITAL_COMMUNITY): Payer: Self-pay

## 2017-05-08 DIAGNOSIS — O3432 Maternal care for cervical incompetence, second trimester: Secondary | ICD-10-CM

## 2017-05-08 DIAGNOSIS — O09899 Supervision of other high risk pregnancies, unspecified trimester: Secondary | ICD-10-CM

## 2017-05-08 DIAGNOSIS — O09219 Supervision of pregnancy with history of pre-term labor, unspecified trimester: Secondary | ICD-10-CM | POA: Insufficient documentation

## 2017-05-08 DIAGNOSIS — O10919 Unspecified pre-existing hypertension complicating pregnancy, unspecified trimester: Secondary | ICD-10-CM

## 2017-05-16 ENCOUNTER — Encounter (HOSPITAL_COMMUNITY): Payer: Self-pay

## 2017-05-16 ENCOUNTER — Ambulatory Visit (HOSPITAL_COMMUNITY)
Admission: RE | Admit: 2017-05-16 | Discharge: 2017-05-16 | Disposition: A | Discharge status: Home / Self Care | Payer: Self-pay | Source: Ambulatory Visit | Attending: Maternal and Fetal Medicine | Admitting: Maternal and Fetal Medicine

## 2017-05-16 DIAGNOSIS — O3432 Maternal care for cervical incompetence, second trimester: Secondary | ICD-10-CM | POA: Insufficient documentation

## 2017-05-16 DIAGNOSIS — Z3A24 24 weeks gestation of pregnancy: Secondary | ICD-10-CM | POA: Insufficient documentation

## 2017-05-20 ENCOUNTER — Encounter: Payer: Self-pay | Admitting: Obstetrics & Gynecology

## 2017-05-24 ENCOUNTER — Encounter: Payer: Self-pay | Admitting: General Practice

## 2017-05-24 ENCOUNTER — Encounter: Payer: Self-pay | Admitting: Obstetrics & Gynecology

## 2017-05-29 ENCOUNTER — Ambulatory Visit (HOSPITAL_COMMUNITY)
Admission: RE | Admit: 2017-05-29 | Discharge: 2017-05-29 | Disposition: A | Discharge status: Home / Self Care | Payer: Self-pay | Source: Ambulatory Visit | Attending: Obstetrics & Gynecology | Admitting: Obstetrics & Gynecology

## 2017-05-29 ENCOUNTER — Other Ambulatory Visit (HOSPITAL_COMMUNITY): Payer: Self-pay

## 2017-05-29 ENCOUNTER — Encounter (HOSPITAL_COMMUNITY): Payer: Self-pay

## 2017-05-29 DIAGNOSIS — Z3A26 26 weeks gestation of pregnancy: Secondary | ICD-10-CM | POA: Insufficient documentation

## 2017-05-29 DIAGNOSIS — O3432 Maternal care for cervical incompetence, second trimester: Secondary | ICD-10-CM | POA: Insufficient documentation

## 2017-05-29 DIAGNOSIS — O34211 Maternal care for low transverse scar from previous cesarean delivery: Secondary | ICD-10-CM | POA: Insufficient documentation

## 2017-05-29 DIAGNOSIS — O283 Abnormal ultrasonic finding on antenatal screening of mother: Secondary | ICD-10-CM | POA: Insufficient documentation

## 2017-05-29 DIAGNOSIS — O10919 Unspecified pre-existing hypertension complicating pregnancy, unspecified trimester: Secondary | ICD-10-CM

## 2017-05-29 DIAGNOSIS — O10012 Pre-existing essential hypertension complicating pregnancy, second trimester: Secondary | ICD-10-CM | POA: Insufficient documentation

## 2017-05-29 DIAGNOSIS — O99212 Obesity complicating pregnancy, second trimester: Secondary | ICD-10-CM | POA: Insufficient documentation

## 2017-05-29 DIAGNOSIS — E669 Obesity, unspecified: Secondary | ICD-10-CM | POA: Insufficient documentation

## 2017-05-29 DIAGNOSIS — O09292 Supervision of pregnancy with other poor reproductive or obstetric history, second trimester: Secondary | ICD-10-CM | POA: Insufficient documentation

## 2017-05-30 ENCOUNTER — Ambulatory Visit (INDEPENDENT_AMBULATORY_CARE_PROVIDER_SITE_OTHER): Payer: Self-pay | Admitting: Obstetrics & Gynecology

## 2017-05-30 ENCOUNTER — Other Ambulatory Visit (HOSPITAL_COMMUNITY): Payer: Self-pay | Admitting: *Deleted

## 2017-05-30 VITALS — BP 115/55 | HR 75 | Wt 207.5 lb

## 2017-05-30 DIAGNOSIS — O3433 Maternal care for cervical incompetence, third trimester: Secondary | ICD-10-CM

## 2017-05-30 DIAGNOSIS — O0990 Supervision of high risk pregnancy, unspecified, unspecified trimester: Secondary | ICD-10-CM

## 2017-05-30 DIAGNOSIS — O10913 Unspecified pre-existing hypertension complicating pregnancy, third trimester: Secondary | ICD-10-CM

## 2017-05-30 DIAGNOSIS — O3430 Maternal care for cervical incompetence, unspecified trimester: Secondary | ICD-10-CM

## 2017-05-30 DIAGNOSIS — O0993 Supervision of high risk pregnancy, unspecified, third trimester: Secondary | ICD-10-CM

## 2017-05-30 LAB — POCT URINALYSIS DIP (DEVICE)
BILIRUBIN URINE: NEGATIVE
Glucose, UA: NEGATIVE mg/dL
Nitrite: NEGATIVE
PH: 5.5 (ref 5.0–8.0)
Protein, ur: NEGATIVE mg/dL
SPECIFIC GRAVITY, URINE: 1.025 (ref 1.005–1.030)
Urobilinogen, UA: 1 mg/dL (ref 0.0–1.0)

## 2017-05-30 MED ORDER — NITROFURANTOIN MONOHYD MACRO 100 MG PO CAPS: 100.0000 mg | ORAL_CAPSULE | Freq: Two times a day (BID) | ORAL | 1 refills | Status: DC

## 2017-05-30 NOTE — Patient Instructions (Signed)
Informacin sobre parto y [place] de parto prematuros (Preterm Labor and Birth Information) La duracin de un embarazo normal es de 67 a 41semanas. Se llama trabajo de parto prematuro cuando se inicia antes de las 37semanas de [place]. [place]? Existen mayores probabilidades de trabajo de parto prematuro en mujeres con las siguientes caractersticas:  Tienen ciertas infecciones durante el embarazo, como infeccin de vejiga, infeccin de transmisin sexual o infeccin en el tero (corioamnionitis).  Tienen el cuello del tero ms corto que lo normal.  Tuvieron trabajo de parto prematuro anteriormente.  Se sometieron a una ciruga en el cuello del tero.  Son menores de 17aos o mayores de 50aos de edad.  Son afroamericanas.  Estn embarazadas de [employer] o de varios bebs (gestacin mltiple).  Consumen drogas o fuman mientras estn embarazadas.  No aumentan de peso lo suficiente durante el [employer].  Se embarazan poco despus de [employer] CULES SON LOS SNTOMAS DEL [place]? Los sntomas del trabajo de parto prematuro incluyen lo siguiente:  Marketing executive similares a los que ocurren durante el perodo menstrual. Los calambres pueden presentarse con diarrea.  Dolor en el abdomen o en la parte inferior de la espalda.  Contracciones uterinas regulares que se pueden sentir como una presin en el abdomen.  Una sensacin de mayor presin en la pelvis.  Aumento de la secrecin de moco acuoso o sanguinolento en la vagina.  Rotura de bolsa (rotura de saco amnitico). POR QU ES IMPORTANTE RECONOCER LOS SIGNOS DEL [place]? Es Glass blower/designer los signos del trabajo de parto prematuro porque los bebs que nacen de forma prematura pueden no estar completamente desarrollados. Por lo tanto, pueden correr mayor riesgo de lo siguiente:  Problemas cardacos y pulmonares a  Barrister's clerk (crnicos).  Inmediatamente despus del parto, dificultades para regular los sistemas corporales, que incluyen glucemia, temperatura corporal, frecuencia cardaca y frecuencia respiratoria.  Hemorragia cerebral.  Parlisis cerebral.  Dificultades en el aprendizaje.  [place]uer[place]. Estos riesgos son [employer] para bebs que nacen antes de las 34semanas de [place]. [place]? El tratamiento depende del tiempo de su [place], su afeccin y la salud de su beb. Puede incluir lo siguiente:  Tener un punto (sutura) en el cuello del tero para evitar que este se abra demasiado pronto (cerclaje).  Tomar medicamentos, por ejemplo: ? Medicamentos hormonales. Estos se pueden administrar de forma temprana en el embarazo para ayudar a Comptroller. ? [place] contracciones. ? Medicamentos que ayudan a [employer] del beb. Estos se pueden recetar si el riesgo de parto es Magazine. ? Medicamentos para evitar que el beb desarrolle parlisis cerebral. Si el trabajo de parto de inicia antes de las 34semanas de [place], es posible que deba hospitalizarse. QU DEBO HACER SI CREO QUE ESTOY EN TRABAJO DE [place]? Si cree que est iniciando trabajo de parto prematuro, llame al mdico de inmediato. [place] DE PARTO PREMATURO EN FUTUROS EMBARAZOS? Para aumentar las probabilidades de tener un embarazo a trmino, Dance movement psychotherapist en cuenta lo siguiente:  No consuma ningn producto que contenga tabaco, lo que incluye cigarrillos, tabaco de Higher education careers adviser y Psychologist, sport and exercise. Si necesita ayuda para dejar de fumar, consulte al mdico.  No consuma drogas ni medicamentos que no sean recetados Solicitor.  Hable con el mdico antes de tomar suplementos a base de hierbas aunque los haya estado tomando  periódicamente. °· Asegúrese de llegar a un peso saludable durante el embarazo. °· Tenga cuidado con las  infecciones. Si cree que puede tener una infección, consulte al médico para que la revisen. °· Asegúrese de informarle al médico si ha tenido trabajo de parto prematuro antes. °Esta información no tiene como fin reemplazar el consejo del médico. Asegúrese de hacerle al médico cualquier pregunta que tenga. °Document Released: 02/05/2008 Document Revised: 07/01/2013 Document Reviewed: 03/21/2016 °Elsevier Interactive Patient Education © 2018 Elsevier Inc. ° °

## 2017-05-30 NOTE — Progress Notes (Signed)
   PRENATAL VISIT NOTE  Subjective:  Bonnie Morales is a 36 y.o. G3P1102 at [redacted]w[redacted]d being seen today for ongoing prenatal care.  She is currently monitored for the following issues for this high-risk pregnancy and has Supervision of high risk pregnancy, antepartum; Chronic hypertension during pregnancy, antepartum; Abnormal first trimester screen; Previous cesarean section complicating pregnancy; and History of eclampsia on her problem list. Cervical incompetence s/p rescue cervical cerclage placement.  Patient would like to attempt VBAC, without epidural. She is interested in BTL after delivering, and is going to attempt to breastfeed her child. She inquires about if she needs to continue with cervical cerclage restrictions (no heavy lifting, no sexual intercourse).  Patient reports vaginal irritation. She reports some burning with urination for the past several days. No other sx.     Movement: Present. Denies leaking of fluid.   The following portions of the patient's history were reviewed and updated as appropriate: allergies, current medications, past family history, past medical history, past social history, past surgical history and problem list. Problem list updated.  Objective:   Vitals:   05/30/17 1053  BP: (!) 115/55  Pulse: 75  Weight: 207 lb 8 oz (94.1 kg)    Fetal Status: Fetal Heart Rate (bpm): 126   Movement: Present     General:  Alert, oriented and cooperative. Patient is in no acute distress.  Skin: Skin is warm and dry. No rash noted.   Cardiovascular: Normal heart rate noted  Respiratory: Normal respiratory effort, no problems with respiration noted  Abdomen: Soft, gravid, appropriate for gestational age (29cm FH).  Pain/Pressure: Present     Pelvic: Cervical exam deferred        Extremities: Normal range of motion.     Mental Status:  Normal mood and affect. Normal behavior. Normal judgment and thought content.    Assessment and Plan:  Pregnancy: [id number] at  [redacted]w[redacted]d  There are no diagnoses linked to this encounter. Preterm/Term  labor symptoms and general obstetric precautions including but not limited to vaginal bleeding, contractions, leaking of fluid and fetal movement were reviewed in detail with the patient. Please refer to After Visit Summary for other counseling recommendations.  Return in about 2 weeks (around 06/13/2017) for 2 hr GTT.  Patient is to continue taking her BP medication and her Progesterone intravaginally. Recommend vaginal rest and abstaining from sexual intercourse for now. Patient may proceed with normal daily lifting and lifting her child without restriction.   Her urine showed moderate leukocytes and will tx appropriately for urinary tract infection (Nitrofurantoin).  Eli Hose, Talitha Givens

## 2017-06-01 LAB — URINE CULTURE, OB REFLEX

## 2017-06-01 LAB — CULTURE, OB URINE

## 2017-06-17 ENCOUNTER — Ambulatory Visit (INDEPENDENT_AMBULATORY_CARE_PROVIDER_SITE_OTHER): Payer: Self-pay | Admitting: Obstetrics and Gynecology

## 2017-06-17 ENCOUNTER — Encounter: Payer: Self-pay | Admitting: Obstetrics and Gynecology

## 2017-06-17 VITALS — BP 119/62 | HR 76 | Wt 205.6 lb

## 2017-06-17 DIAGNOSIS — O0990 Supervision of high risk pregnancy, unspecified, unspecified trimester: Secondary | ICD-10-CM

## 2017-06-17 DIAGNOSIS — Z8759 Personal history of other complications of pregnancy, childbirth and the puerperium: Secondary | ICD-10-CM

## 2017-06-17 DIAGNOSIS — L298 Other pruritus: Secondary | ICD-10-CM

## 2017-06-17 DIAGNOSIS — O3433 Maternal care for cervical incompetence, third trimester: Secondary | ICD-10-CM

## 2017-06-17 DIAGNOSIS — Z758 Other problems related to medical facilities and other health care: Secondary | ICD-10-CM

## 2017-06-17 DIAGNOSIS — N898 Other specified noninflammatory disorders of vagina: Secondary | ICD-10-CM

## 2017-06-17 DIAGNOSIS — Z789 Other specified health status: Secondary | ICD-10-CM | POA: Insufficient documentation

## 2017-06-17 DIAGNOSIS — B3731 Acute candidiasis of vulva and vagina: Secondary | ICD-10-CM

## 2017-06-17 DIAGNOSIS — O10913 Unspecified pre-existing hypertension complicating pregnancy, third trimester: Secondary | ICD-10-CM

## 2017-06-17 DIAGNOSIS — O3430 Maternal care for cervical incompetence, unspecified trimester: Secondary | ICD-10-CM

## 2017-06-17 DIAGNOSIS — Z603 Acculturation difficulty: Secondary | ICD-10-CM

## 2017-06-17 DIAGNOSIS — Z113 Encounter for screening for infections with a predominantly sexual mode of transmission: Secondary | ICD-10-CM

## 2017-06-17 DIAGNOSIS — O26893 Other specified pregnancy related conditions, third trimester: Secondary | ICD-10-CM

## 2017-06-17 DIAGNOSIS — B373 Candidiasis of vulva and vagina: Secondary | ICD-10-CM

## 2017-06-17 DIAGNOSIS — B9689 Other specified bacterial agents as the cause of diseases classified elsewhere: Secondary | ICD-10-CM

## 2017-06-17 DIAGNOSIS — N760 Acute vaginitis: Secondary | ICD-10-CM

## 2017-06-17 DIAGNOSIS — O0993 Supervision of high risk pregnancy, unspecified, third trimester: Secondary | ICD-10-CM

## 2017-06-17 DIAGNOSIS — O10919 Unspecified pre-existing hypertension complicating pregnancy, unspecified trimester: Secondary | ICD-10-CM

## 2017-06-17 DIAGNOSIS — Z23 Encounter for immunization: Secondary | ICD-10-CM

## 2017-06-17 HISTORY — DX: Other problems related to medical facilities and other health care: Z75.8

## 2017-06-17 HISTORY — DX: Acculturation difficulty: Z60.3

## 2017-06-17 LAB — POCT URINALYSIS DIP (DEVICE)
Bilirubin Urine: NEGATIVE
GLUCOSE, UA: NEGATIVE mg/dL
Ketones, ur: NEGATIVE mg/dL
Nitrite: NEGATIVE
PROTEIN: NEGATIVE mg/dL
Specific Gravity, Urine: 1.02 (ref 1.005–1.030)
UROBILINOGEN UA: 1 mg/dL (ref 0.0–1.0)
pH: 6.5 (ref 5.0–8.0)

## 2017-06-17 MED ORDER — METRONIDAZOLE 500 MG PO TABS: 500.0000 mg | ORAL_TABLET | Freq: Two times a day (BID) | ORAL | 0 refills | Status: DC

## 2017-06-17 MED ORDER — MICONAZOLE NITRATE 2 % VA CREA: 1.0000 | TOPICAL_CREAM | Freq: Every day | VAGINAL | 2 refills | Status: DC

## 2017-06-17 NOTE — Progress Notes (Signed)
Prenatal Visit Note Date: 06/17/2017 Clinic: [facility]  Subjective:  Bonnie Morales is a 36 y.o. [phone] at [redacted]w[redacted]d being seen today for ongoing prenatal care.  She is currently monitored for the following issues for this high-risk pregnancy and has Supervision of high risk pregnancy, antepartum; Chronic hypertension during pregnancy, antepartum; Abnormal first trimester screen; History of VBAC; History of eclampsia; Cervical incompetence affecting management of mother, antepartum; and Language barrier on her problem list.  Patient reports see RN note.   Contractions: Not present. Vag. Bleeding: None.  Movement: Present. Denies leaking of fluid.   The following portions of the patient's history were reviewed and updated as appropriate: allergies, current medications, past family history, past medical history, past social history, past surgical history and problem list. Problem list updated.  Objective:   Vitals:   06/17/17 0850  BP: 119/62  Pulse: 76  Weight: 205 lb 9.6 oz (93.3 kg)    Fetal Status: Fetal Heart Rate (bpm): 155   Movement: Present     General:  Alert, oriented and cooperative. Patient is in no acute distress.  Skin: Skin is warm and dry. No rash noted.   Cardiovascular: Normal heart rate noted  Respiratory: Normal respiratory effort, no problems with respiration noted  Abdomen: Soft, gravid, appropriate for gestational age. Pain/Pressure: Present     Pelvic:  Cervical exam performed        EGBUS with b/l erythema and white cottage cheese d/c Vault: same as above. Cervix: visually closed. Stitch in place  Extremities: Normal range of motion.  Edema: None  Mental Status: Normal mood and affect. Normal behavior. Normal judgment and thought content.   Urinalysis:      Assessment and Plan:  Pregnancy: G3P1102 at [redacted]w[redacted]d  1. Supervision of high risk pregnancy, antepartum, third trimester Routine care - Glucose Tolerance, 2 Hours w/1 Hour -  HIV antibody (with reflex) - CBC - RPR  2. Need for Tdap vaccination - Tdap vaccine greater than or equal to 7yo IM  3. Vaginal itching Flagyl and monistat 7 sent in  4. Vaginal discharge during pregnancy in third trimester See above - Cervicovaginal ancillary only  5. Language barrier Interpreter used  6. History of eclampsia Continue baby asa  7. Chronic hypertension during pregnancy, antepartum Doing well on labetalol 100/100. Normal growth u/s. Continue with the serial growth u/s and start AP testing at 32wks  8. Supervision of high risk pregnancy, antepartum See above  9. Cervical incompetence affecting management of mother, antepartum See above.   Preterm labor symptoms and general obstetric precautions including but not limited to vaginal bleeding, contractions, leaking of fluid and fetal movement were reviewed in detail with the patient. Please refer to After Visit Summary for other counseling recommendations.  Return in about 2 weeks (around 07/01/2017).   Aletha Halim, MD

## 2017-06-17 NOTE — Progress Notes (Signed)
Pt c/o vaginal itching and a watery discharge since Wed 8/1 Spanish interpreter "Sunday Spillers" [phone]

## 2017-06-18 LAB — CBC
HEMATOCRIT: 35.6 % (ref 34.0–46.6)
HEMOGLOBIN: 11.6 g/dL (ref 11.1–15.9)
MCH: 29.7 pg (ref 26.6–33.0)
MCHC: 32.6 g/dL (ref 31.5–35.7)
MCV: 91 fL (ref 79–97)
Platelets: 257 10*3/uL (ref 150–379)
RBC: 3.9 x10E6/uL (ref 3.77–5.28)
RDW: 15.3 % (ref 12.3–15.4)
WBC: 7.2 10*3/uL (ref 3.4–10.8)

## 2017-06-18 LAB — CERVICOVAGINAL ANCILLARY ONLY
Bacterial vaginitis: NEGATIVE
Candida vaginitis: POSITIVE — AB
Chlamydia: NEGATIVE
NEISSERIA GONORRHEA: NEGATIVE
TRICH (WINDOWPATH): NEGATIVE

## 2017-06-18 LAB — GLUCOSE TOLERANCE, 2 HOURS W/ 1HR
GLUCOSE, 2 HOUR: 107 mg/dL (ref 65–152)
GLUCOSE, FASTING: 88 mg/dL (ref 65–91)
Glucose, 1 hour: 171 mg/dL (ref 65–179)

## 2017-06-18 LAB — HIV ANTIBODY (ROUTINE TESTING W REFLEX): HIV SCREEN 4TH GENERATION: NONREACTIVE

## 2017-06-18 LAB — RPR: RPR Ser Ql: NONREACTIVE

## 2017-06-26 ENCOUNTER — Other Ambulatory Visit (HOSPITAL_COMMUNITY): Payer: Self-pay | Admitting: Maternal and Fetal Medicine

## 2017-06-26 ENCOUNTER — Ambulatory Visit (HOSPITAL_COMMUNITY)
Admission: RE | Admit: 2017-06-26 | Discharge: 2017-06-26 | Disposition: A | Discharge status: Home / Self Care | Payer: Self-pay | Source: Ambulatory Visit | Attending: Obstetrics & Gynecology | Admitting: Obstetrics & Gynecology

## 2017-06-26 ENCOUNTER — Encounter (HOSPITAL_COMMUNITY): Payer: Self-pay

## 2017-06-26 DIAGNOSIS — O10913 Unspecified pre-existing hypertension complicating pregnancy, third trimester: Secondary | ICD-10-CM

## 2017-06-26 DIAGNOSIS — O10013 Pre-existing essential hypertension complicating pregnancy, third trimester: Secondary | ICD-10-CM | POA: Insufficient documentation

## 2017-06-26 DIAGNOSIS — Z3A30 30 weeks gestation of pregnancy: Secondary | ICD-10-CM

## 2017-06-26 DIAGNOSIS — O09299 Supervision of pregnancy with other poor reproductive or obstetric history, unspecified trimester: Secondary | ICD-10-CM

## 2017-06-26 DIAGNOSIS — O99213 Obesity complicating pregnancy, third trimester: Secondary | ICD-10-CM

## 2017-06-26 DIAGNOSIS — E669 Obesity, unspecified: Secondary | ICD-10-CM | POA: Insufficient documentation

## 2017-06-26 DIAGNOSIS — O34219 Maternal care for unspecified type scar from previous cesarean delivery: Secondary | ICD-10-CM

## 2017-06-26 DIAGNOSIS — O09293 Supervision of pregnancy with other poor reproductive or obstetric history, third trimester: Secondary | ICD-10-CM | POA: Insufficient documentation

## 2017-06-27 ENCOUNTER — Other Ambulatory Visit (HOSPITAL_COMMUNITY): Payer: Self-pay | Admitting: *Deleted

## 2017-06-27 DIAGNOSIS — O10919 Unspecified pre-existing hypertension complicating pregnancy, unspecified trimester: Secondary | ICD-10-CM

## 2017-07-01 ENCOUNTER — Ambulatory Visit (INDEPENDENT_AMBULATORY_CARE_PROVIDER_SITE_OTHER): Payer: Self-pay | Admitting: Family Medicine

## 2017-07-01 VITALS — BP 116/75 | HR 77 | Wt 207.5 lb

## 2017-07-01 DIAGNOSIS — O10919 Unspecified pre-existing hypertension complicating pregnancy, unspecified trimester: Secondary | ICD-10-CM

## 2017-07-01 DIAGNOSIS — Z8759 Personal history of other complications of pregnancy, childbirth and the puerperium: Secondary | ICD-10-CM

## 2017-07-01 DIAGNOSIS — O0990 Supervision of high risk pregnancy, unspecified, unspecified trimester: Secondary | ICD-10-CM

## 2017-07-01 DIAGNOSIS — O3433 Maternal care for cervical incompetence, third trimester: Secondary | ICD-10-CM

## 2017-07-01 DIAGNOSIS — Z789 Other specified health status: Secondary | ICD-10-CM

## 2017-07-01 DIAGNOSIS — O0993 Supervision of high risk pregnancy, unspecified, third trimester: Secondary | ICD-10-CM

## 2017-07-01 DIAGNOSIS — O10913 Unspecified pre-existing hypertension complicating pregnancy, third trimester: Secondary | ICD-10-CM

## 2017-07-01 DIAGNOSIS — O3430 Maternal care for cervical incompetence, unspecified trimester: Secondary | ICD-10-CM

## 2017-07-01 MED ORDER — FLUCONAZOLE 150 MG PO TABS: 150.0000 mg | ORAL_TABLET | Freq: Every day | ORAL | 0 refills | Status: AC

## 2017-07-01 NOTE — Addendum Note (Signed)
Addended by: Christiana Pellant A on: 07/01/2017 01:27 PM   Modules accepted: Orders

## 2017-07-01 NOTE — Progress Notes (Signed)
   PRENATAL VISIT NOTE  Subjective:  Bonnie Morales is a 36 y.o. G3P1102 at [redacted]w[redacted]d being seen today for ongoing prenatal care.  She is currently monitored for the following issues for this high-risk pregnancy and has Supervision of high risk pregnancy, antepartum; Chronic hypertension during pregnancy, antepartum; Abnormal first trimester screen; History of VBAC; History of eclampsia; Cervical incompetence affecting management of mother, antepartum; and Language barrier on her problem list.  Patient reports pain in the vagina, some clumpy white discharge (improved).   .  .  Movement: Present. Denies leaking of fluid.   The following portions of the patient's history were reviewed and updated as appropriate: allergies, current medications, past family history, past medical history, past social history, past surgical history and problem list. Problem list updated.  Objective:   Vitals:   07/01/17 1249  BP: 116/75  Pulse: 77  Weight: 207 lb 8 oz (94.1 kg)    Fetal Status: Fetal Heart Rate (bpm): 145   Movement: Present     General:  Alert, oriented and cooperative. Patient is in no acute distress.  Skin: Skin is warm and dry. No rash noted.   Cardiovascular: Normal heart rate noted  Respiratory: Normal respiratory effort, no problems with respiration noted  Abdomen: Soft, gravid, appropriate for gestational age.  Pain/Pressure: Present     Pelvic: Cervical exam deferred        Extremities: Normal range of motion.     Mental Status:  Normal mood and affect. Normal behavior. Normal judgment and thought content.   Assessment and Plan:  Pregnancy: G3P1102 at [redacted]w[redacted]d  1. Supervision of high risk pregnancy, antepartum FHt and FH normal. Diflucan to finish treating yeast infection  2. Language barrier Interpreter used  3. History of eclampsia Continue taking ASA  4. Cervical incompetence affecting management of mother, antepartum Has cerclage.  5. Chronic hypertension during  pregnancy, antepartum Controlled with labetalol  Preterm labor symptoms and general obstetric precautions including but not limited to vaginal bleeding, contractions, leaking of fluid and fetal movement were reviewed in detail with the patient. Please refer to After Visit Summary for other counseling recommendations.  Return in about 8 days (around 07/09/2017) for NST/BPP.   Truett Mainland, DO

## 2017-07-09 ENCOUNTER — Ambulatory Visit (INDEPENDENT_AMBULATORY_CARE_PROVIDER_SITE_OTHER): Payer: Self-pay | Admitting: *Deleted

## 2017-07-09 ENCOUNTER — Ambulatory Visit: Payer: Self-pay

## 2017-07-09 VITALS — BP 114/60 | HR 71

## 2017-07-09 DIAGNOSIS — O10913 Unspecified pre-existing hypertension complicating pregnancy, third trimester: Secondary | ICD-10-CM

## 2017-07-09 NOTE — Progress Notes (Signed)
[employer] video Interpreter Rebeca # [id number] used for encounter.    Pt informed that the ultrasound is considered a limited OB ultrasound and is not intended to be a complete ultrasound exam.  Patient also informed that the ultrasound is not being completed with the intent of assessing for fetal or placental anomalies or any pelvic abnormalities.  Explained that the purpose of today's ultrasound is to assess for presentation, BPP and amniotic fluid volume.  Patient acknowledges the purpose of the exam and the limitations of the study.

## 2017-07-18 ENCOUNTER — Ambulatory Visit: Payer: Self-pay | Admitting: *Deleted

## 2017-07-18 ENCOUNTER — Ambulatory Visit (INDEPENDENT_AMBULATORY_CARE_PROVIDER_SITE_OTHER): Payer: Self-pay | Admitting: Family Medicine

## 2017-07-18 ENCOUNTER — Encounter: Payer: Self-pay | Admitting: Obstetrics and Gynecology

## 2017-07-18 ENCOUNTER — Ambulatory Visit: Payer: Self-pay

## 2017-07-18 VITALS — BP 110/61 | HR 79 | Wt 210.3 lb

## 2017-07-18 DIAGNOSIS — O10913 Unspecified pre-existing hypertension complicating pregnancy, third trimester: Secondary | ICD-10-CM

## 2017-07-18 DIAGNOSIS — O3433 Maternal care for cervical incompetence, third trimester: Secondary | ICD-10-CM

## 2017-07-18 DIAGNOSIS — O0993 Supervision of high risk pregnancy, unspecified, third trimester: Secondary | ICD-10-CM

## 2017-07-18 DIAGNOSIS — Z8759 Personal history of other complications of pregnancy, childbirth and the puerperium: Secondary | ICD-10-CM

## 2017-07-18 DIAGNOSIS — Z789 Other specified health status: Secondary | ICD-10-CM

## 2017-07-18 DIAGNOSIS — O0990 Supervision of high risk pregnancy, unspecified, unspecified trimester: Secondary | ICD-10-CM

## 2017-07-18 DIAGNOSIS — O3430 Maternal care for cervical incompetence, unspecified trimester: Secondary | ICD-10-CM

## 2017-07-18 LAB — POCT URINALYSIS DIP (DEVICE)
BILIRUBIN URINE: NEGATIVE
GLUCOSE, UA: NEGATIVE mg/dL
KETONES UR: NEGATIVE mg/dL
Nitrite: NEGATIVE
Protein, ur: NEGATIVE mg/dL
SPECIFIC GRAVITY, URINE: 1.02 (ref 1.005–1.030)
Urobilinogen, UA: 1 mg/dL (ref 0.0–1.0)
pH: 7 (ref 5.0–8.0)

## 2017-07-18 NOTE — Progress Notes (Signed)
[place] for growth,BPP and NST scheduled on 9/13.

## 2017-07-18 NOTE — Progress Notes (Signed)
   PRENATAL VISIT NOTE  Subjective:  Bonnie Morales is a 36 y.o. G3P1102 at [redacted]w[redacted]d being seen today for ongoing prenatal care.  She is currently monitored for the following issues for this high-risk pregnancy and has Supervision of high risk pregnancy, antepartum; Chronic hypertension during pregnancy, antepartum; Abnormal first trimester screen; History of VBAC; History of eclampsia; Cervical incompetence affecting management of mother, antepartum; and Language barrier on her problem list.  Patient reports no complaints.  Contractions: Irregular. Vag. Bleeding: None.  Movement: Present. Denies leaking of fluid.   The following portions of the patient's history were reviewed and updated as appropriate: allergies, current medications, past family history, past medical history, past social history, past surgical history and problem list. Problem list updated.  Objective:   Vitals:   07/18/17 1405  BP: 110/61  Pulse: 79  Weight: 210 lb 4.8 oz (95.4 kg)    Fetal Status: Fetal Heart Rate (bpm): NST   Movement: Present     General:  Alert, oriented and cooperative. Patient is in no acute distress.  Skin: Skin is warm and dry. No rash noted.   Cardiovascular: Normal heart rate noted  Respiratory: Normal respiratory effort, no problems with respiration noted  Abdomen: Soft, gravid, appropriate for gestational age.  Pain/Pressure: Present     Pelvic: Cervical exam deferred        Extremities: Normal range of motion.  Edema: None  Mental Status:  Normal mood and affect. Normal behavior. Normal judgment and thought content.   Assessment and Plan:  Pregnancy: G3P1102 at [redacted]w[redacted]d  1. Supervision of high risk pregnancy, antepartum FHT and FH normal  2. Preexisting hypertension complicating pregnancy, antepartum, third trimester BP controlled BPP 10/10 NST reactive  3. Cervical incompetence affecting management of mother, antepartum conttinue prometrium  4. Language barrier Interpreter  used  5. History of eclampsia BP normal  Preterm labor symptoms and general obstetric precautions including but not limited to vaginal bleeding, contractions, leaking of fluid and fetal movement were reviewed in detail with the patient. Please refer to After Visit Summary for other counseling recommendations.  Return in about 2 weeks (around 08/01/2017) for Ob fu, NST/BPP.   Truett Mainland, DO

## 2017-07-18 NOTE — Progress Notes (Signed)

## 2017-07-25 ENCOUNTER — Other Ambulatory Visit (HOSPITAL_COMMUNITY): Payer: Self-pay | Admitting: *Deleted

## 2017-07-25 ENCOUNTER — Ambulatory Visit (INDEPENDENT_AMBULATORY_CARE_PROVIDER_SITE_OTHER): Payer: Self-pay | Admitting: *Deleted

## 2017-07-25 ENCOUNTER — Other Ambulatory Visit: Payer: Self-pay | Admitting: Obstetrics and Gynecology

## 2017-07-25 ENCOUNTER — Encounter (HOSPITAL_COMMUNITY): Payer: Self-pay

## 2017-07-25 ENCOUNTER — Ambulatory Visit (HOSPITAL_COMMUNITY)
Admission: RE | Admit: 2017-07-25 | Discharge: 2017-07-25 | Disposition: A | Discharge status: Home / Self Care | Payer: Self-pay | Source: Ambulatory Visit | Attending: Obstetrics & Gynecology | Admitting: Obstetrics & Gynecology

## 2017-07-25 ENCOUNTER — Other Ambulatory Visit (HOSPITAL_COMMUNITY): Payer: Self-pay | Admitting: Maternal and Fetal Medicine

## 2017-07-25 VITALS — BP 119/58 | HR 78 | Wt 209.7 lb

## 2017-07-25 DIAGNOSIS — O10913 Unspecified pre-existing hypertension complicating pregnancy, third trimester: Secondary | ICD-10-CM

## 2017-07-25 DIAGNOSIS — O2203 Varicose veins of lower extremity in pregnancy, third trimester: Secondary | ICD-10-CM | POA: Insufficient documentation

## 2017-07-25 DIAGNOSIS — Z8759 Personal history of other complications of pregnancy, childbirth and the puerperium: Secondary | ICD-10-CM

## 2017-07-25 DIAGNOSIS — O3433 Maternal care for cervical incompetence, third trimester: Secondary | ICD-10-CM

## 2017-07-25 DIAGNOSIS — Z3A37 37 weeks gestation of pregnancy: Secondary | ICD-10-CM | POA: Insufficient documentation

## 2017-07-25 DIAGNOSIS — O34219 Maternal care for unspecified type scar from previous cesarean delivery: Secondary | ICD-10-CM

## 2017-07-25 DIAGNOSIS — O10013 Pre-existing essential hypertension complicating pregnancy, third trimester: Secondary | ICD-10-CM

## 2017-07-25 DIAGNOSIS — O10919 Unspecified pre-existing hypertension complicating pregnancy, unspecified trimester: Secondary | ICD-10-CM

## 2017-07-25 DIAGNOSIS — Z3A34 34 weeks gestation of pregnancy: Secondary | ICD-10-CM

## 2017-07-25 DIAGNOSIS — O99213 Obesity complicating pregnancy, third trimester: Secondary | ICD-10-CM

## 2017-07-25 DIAGNOSIS — O281 Abnormal biochemical finding on antenatal screening of mother: Secondary | ICD-10-CM

## 2017-07-25 DIAGNOSIS — O09293 Supervision of pregnancy with other poor reproductive or obstetric history, third trimester: Secondary | ICD-10-CM

## 2017-07-25 DIAGNOSIS — O289 Unspecified abnormal findings on antenatal screening of mother: Secondary | ICD-10-CM

## 2017-07-25 DIAGNOSIS — O0990 Supervision of high risk pregnancy, unspecified, unspecified trimester: Secondary | ICD-10-CM

## 2017-07-25 DIAGNOSIS — Z98891 History of uterine scar from previous surgery: Secondary | ICD-10-CM

## 2017-07-25 NOTE — Addendum Note (Signed)
Addended by: Langston Reusing on: 07/25/2017 02:11 PM   Modules accepted: Orders

## 2017-07-25 NOTE — Progress Notes (Signed)
Interpreter [employer] present for encounter.  [place] for growth and BPP today

## 2017-07-29 ENCOUNTER — Ambulatory Visit (HOSPITAL_COMMUNITY)
Admission: RE | Admit: 2017-07-29 | Discharge: 2017-07-29 | Disposition: A | Discharge status: Home / Self Care | Payer: Self-pay | Source: Ambulatory Visit | Attending: Obstetrics & Gynecology | Admitting: Obstetrics & Gynecology

## 2017-07-29 ENCOUNTER — Encounter (HOSPITAL_COMMUNITY): Payer: Self-pay

## 2017-07-29 DIAGNOSIS — O99213 Obesity complicating pregnancy, third trimester: Secondary | ICD-10-CM | POA: Insufficient documentation

## 2017-07-29 DIAGNOSIS — O34219 Maternal care for unspecified type scar from previous cesarean delivery: Secondary | ICD-10-CM | POA: Insufficient documentation

## 2017-07-29 DIAGNOSIS — Z3A34 34 weeks gestation of pregnancy: Secondary | ICD-10-CM | POA: Insufficient documentation

## 2017-07-29 DIAGNOSIS — Z8759 Personal history of other complications of pregnancy, childbirth and the puerperium: Secondary | ICD-10-CM

## 2017-07-29 DIAGNOSIS — E669 Obesity, unspecified: Secondary | ICD-10-CM | POA: Insufficient documentation

## 2017-07-29 DIAGNOSIS — O10919 Unspecified pre-existing hypertension complicating pregnancy, unspecified trimester: Secondary | ICD-10-CM

## 2017-07-29 DIAGNOSIS — O289 Unspecified abnormal findings on antenatal screening of mother: Secondary | ICD-10-CM | POA: Insufficient documentation

## 2017-07-29 DIAGNOSIS — Z98891 History of uterine scar from previous surgery: Secondary | ICD-10-CM

## 2017-07-29 DIAGNOSIS — O0990 Supervision of high risk pregnancy, unspecified, unspecified trimester: Secondary | ICD-10-CM

## 2017-07-29 DIAGNOSIS — O10013 Pre-existing essential hypertension complicating pregnancy, third trimester: Secondary | ICD-10-CM | POA: Insufficient documentation

## 2017-07-29 DIAGNOSIS — O3433 Maternal care for cervical incompetence, third trimester: Secondary | ICD-10-CM | POA: Insufficient documentation

## 2017-07-29 DIAGNOSIS — O09293 Supervision of pregnancy with other poor reproductive or obstetric history, third trimester: Secondary | ICD-10-CM | POA: Insufficient documentation

## 2017-08-01 ENCOUNTER — Ambulatory Visit (INDEPENDENT_AMBULATORY_CARE_PROVIDER_SITE_OTHER): Payer: Self-pay | Admitting: Obstetrics & Gynecology

## 2017-08-01 ENCOUNTER — Other Ambulatory Visit: Payer: Self-pay

## 2017-08-01 ENCOUNTER — Other Ambulatory Visit (HOSPITAL_COMMUNITY): Payer: Self-pay | Admitting: Maternal and Fetal Medicine

## 2017-08-01 ENCOUNTER — Ambulatory Visit (HOSPITAL_COMMUNITY)
Admission: RE | Admit: 2017-08-01 | Discharge: 2017-08-01 | Disposition: A | Discharge status: Home / Self Care | Payer: Self-pay | Source: Ambulatory Visit | Attending: Obstetrics & Gynecology | Admitting: Obstetrics & Gynecology

## 2017-08-01 ENCOUNTER — Encounter (HOSPITAL_COMMUNITY): Payer: Self-pay

## 2017-08-01 VITALS — BP 111/61 | HR 59 | Wt 209.0 lb

## 2017-08-01 DIAGNOSIS — O10919 Unspecified pre-existing hypertension complicating pregnancy, unspecified trimester: Secondary | ICD-10-CM

## 2017-08-01 DIAGNOSIS — O34219 Maternal care for unspecified type scar from previous cesarean delivery: Secondary | ICD-10-CM

## 2017-08-01 DIAGNOSIS — Z23 Encounter for immunization: Secondary | ICD-10-CM

## 2017-08-01 DIAGNOSIS — O10913 Unspecified pre-existing hypertension complicating pregnancy, third trimester: Secondary | ICD-10-CM

## 2017-08-01 DIAGNOSIS — Z98891 History of uterine scar from previous surgery: Secondary | ICD-10-CM

## 2017-08-01 DIAGNOSIS — O99213 Obesity complicating pregnancy, third trimester: Secondary | ICD-10-CM

## 2017-08-01 DIAGNOSIS — O0990 Supervision of high risk pregnancy, unspecified, unspecified trimester: Secondary | ICD-10-CM

## 2017-08-01 DIAGNOSIS — O09893 Supervision of other high risk pregnancies, third trimester: Secondary | ICD-10-CM | POA: Insufficient documentation

## 2017-08-01 DIAGNOSIS — Z8759 Personal history of other complications of pregnancy, childbirth and the puerperium: Secondary | ICD-10-CM

## 2017-08-01 DIAGNOSIS — IMO0001 Reserved for inherently not codable concepts without codable children: Secondary | ICD-10-CM

## 2017-08-01 DIAGNOSIS — O3430 Maternal care for cervical incompetence, unspecified trimester: Secondary | ICD-10-CM

## 2017-08-01 DIAGNOSIS — O133 Gestational [pregnancy-induced] hypertension without significant proteinuria, third trimester: Secondary | ICD-10-CM | POA: Insufficient documentation

## 2017-08-01 DIAGNOSIS — O0993 Supervision of high risk pregnancy, unspecified, third trimester: Secondary | ICD-10-CM

## 2017-08-01 DIAGNOSIS — O3433 Maternal care for cervical incompetence, third trimester: Secondary | ICD-10-CM

## 2017-08-01 DIAGNOSIS — O283 Abnormal ultrasonic finding on antenatal screening of mother: Secondary | ICD-10-CM | POA: Insufficient documentation

## 2017-08-01 DIAGNOSIS — O289 Unspecified abnormal findings on antenatal screening of mother: Secondary | ICD-10-CM

## 2017-08-01 DIAGNOSIS — O99891 Other specified diseases and conditions complicating pregnancy: Secondary | ICD-10-CM | POA: Insufficient documentation

## 2017-08-01 DIAGNOSIS — Z3A35 35 weeks gestation of pregnancy: Secondary | ICD-10-CM

## 2017-08-01 DIAGNOSIS — Z789 Other specified health status: Secondary | ICD-10-CM

## 2017-08-01 DIAGNOSIS — E669 Obesity, unspecified: Secondary | ICD-10-CM | POA: Insufficient documentation

## 2017-08-01 DIAGNOSIS — O1493 Unspecified pre-eclampsia, third trimester: Secondary | ICD-10-CM | POA: Insufficient documentation

## 2017-08-01 LAB — POCT URINALYSIS DIP (DEVICE)
Bilirubin Urine: NEGATIVE
GLUCOSE, UA: NEGATIVE mg/dL
Hgb urine dipstick: NEGATIVE
KETONES UR: NEGATIVE mg/dL
NITRITE: NEGATIVE
PH: 6.5 (ref 5.0–8.0)
PROTEIN: NEGATIVE mg/dL
Specific Gravity, Urine: 1.02 (ref 1.005–1.030)
Urobilinogen, UA: >=8 mg/dL (ref 0.0–1.0)

## 2017-08-01 NOTE — Patient Instructions (Signed)
Parto vaginal despus de Elmon Else (Vaginal Birth After Cesarean Delivery) Un parto vaginal despus de un parto por cesrea es dar a luz por la vagina despus de haber dado a luz por medio de una intervencin [place]. En el pasado, si una mujer tena un beb por cesrea, todos los partos posteriores deban hacerse por cesrea. Esto ya no es as. Puede ser seguro para la mam intentar un parto vaginal luego de una cesrea. Es importante que converse con su mdico desde comienzos del Media planner de modo que pueda [employer], beneficios y opciones. Le dar tiempo para decidir qu es lo mejor en su caso particular. La decisin final de tener un parto vaginal o por cesrea debe tomarse en conjunto, entre usted y el mdico. Cualquier cambio en su salud o la de su beb durante el embarazo puede ser motivo de un cambio de decisin respecto del parto vaginal. LAS MUJERES QUE OPTAN POR EL PARTO VAGINAL, DEBEN CONSULTAR AL MDICO PARA ASEGURARSE DE QUE:  La cesrea anterior se haya realizado con un corte (incisin) uterino transversal (no con una incisin vertical clsica).  El canal de parto es lo suficientemente grande como para que pase el [place].  No ha sido sometida a otras operaciones del tero.  Durante el trabajo de parto, le realizarn un monitoreo fetal Emergency planning/management officer, en todo momento.  Habr un quirfano disponible y listo en caso de necesitar una cesrea de emergencia.  Un mdico y personal de quirfano estarn disponibles en todo momento durante el [place] de parto, para realizar una cesrea en caso de ser necesario.  Habr un anestesista disponible en caso de necesitar una cesrea de emergencia.  La nursery est lista y cuenta con personal especializado y el equipo disponible para cuidar al beb en caso de emergencia. BENEFICIOS DEL PARTO VAGINAL:  Permanencia ms breve en el hospital.  Prevencin de los riesgos asociados con el parto por cesrea, por ejemplo: ? Complicaciones  quirrgicas, como apertura o hernia de la incisin. ? Lesiones en otros rganos. ? Fiebre. Esto puede ocurrir si aparece una infeccin despus de la ciruga. Tambin puede ocurrir como reaccin a los medicamentos administrados para adormecerla durante la [place].  Menos prdida de sangre y menos probabilidad de necesitar una transfusin sangunea.  Menor riesgo de cogulos sanguneos e infeccin.  Tiempo ms corto de recuperacin.  Menor riesgo de remocin del tero (histerectoma).  Menor riesgo de que la placenta cubra parcial o completamente la abertura del tero (placenta previa) en embarazos futuros.  Menos riesgos en el [place] de parto y [place] futuros. RIESGOS  Ruptura del tero. Esto ocurre en menos del 1% de los partos vaginales. El riesgo de que eso suceda es mayor si: ? Se toman medidas para iniciar el proceso del trabajo de parto (inducir Doctor, hospital) o Educational psychologist o intensificar las contracciones (aumentar el trabajo de [place]). ? Se usan medicamentos para ablandar (madurar) el cuello del tero.  Es necesario extraer el tero (histerectoma) si se rompe. No debe llevarse a cabo si:  La cesrea previa se realiz con una incisin vertical (clsica) o con forma de T, o usted no sabe cul de [employer] han practicado.  Ha sufrido ruptura del tero.  Ha tenido ciertos tipos de Leisure centre manager en el tero, como la extirpacin de fibromas uterinos. Pregntele a su mdico sobre otros tipos de cirugas que le impiden tener un parto vaginal.  Tiene ciertos problemas mdicos o relacionados con el parto (obsttricos).  El beb est en problemas.  Tuvo dos cesreas previas y ningn parto vaginal. OTRAS COSAS QUE DEBE SABER:  La anestesia peridural es segura.  Es seguro dar vuelta al beb si se encuentra de nalgas (intentar una versin ceflica externa).  Es seguro intentarlo en caso de mellizos.  El parto vaginal puede no ser apropiado si el beb pesa 8,8lb (4kg) o ms. Sin embargo,  las predicciones de [place] no son siempre exactas y no deben ser lo nico a tenerse en cuenta para decidir si el parto vaginal es lo indicado para usted.  Hay aumento en el porcentaje de fracasos si el intervalo entre la cesrea y el parto vaginal es de menos de 19 meses.  Su mdico puede aconsejarle no tener un parto vaginal si tiene preeclampsia (hipertensin, protena en la orina e hinchazn en la cara y las extremidades).  El parto vaginal suele ser exitoso si ya tuvo un parto vaginal previamente.  Tambin suele ser exitoso cuando el trabajo de parto comienza espontneamente antes de la fecha.  El parto vaginal despus de Elmon Else es similar a un parto espontneo vaginal normal. Esta informacin no tiene Marine scientist el consejo del mdico. Asegrese de hacerle al mdico cualquier pregunta que tenga. Document Released: 04/16/2008 Document Revised: 08/19/2013 Document Reviewed: 05/28/2013 Elsevier Interactive Patient Education  [employer].

## 2017-08-01 NOTE — Progress Notes (Signed)
   PRENATAL VISIT NOTE  Subjective:  Bonnie Morales is a 36 y.o. G3P1102 at [redacted]w[redacted]d being seen today for ongoing prenatal care.  She is currently monitored for the following issues for this high-risk pregnancy and has Supervision of high risk pregnancy, antepartum; Chronic hypertension during pregnancy, antepartum; Abnormal first trimester screen; History of VBAC; History of eclampsia; Cervical incompetence affecting management of mother, antepartum; and Language barrier on her problem list.  Patient reports pressure.  Contractions: Irregular. Vag. Bleeding: None.  Movement: Present. Denies leaking of fluid.   The following portions of the patient's history were reviewed and updated as appropriate: allergies, current medications, past family history, past medical history, past social history, past surgical history and problem list. Problem list updated.  Objective:   Vitals:   08/01/17 1307  BP: 111/61  Pulse: (!) 59  Weight: 209 lb (94.8 kg)    Fetal Status:     Movement: Present     General:  Alert, oriented and cooperative. Patient is in no acute distress.  Skin: Skin is warm and dry. No rash noted.   Cardiovascular: Normal heart rate noted  Respiratory: Normal respiratory effort, no problems with respiration noted  Abdomen: Soft, gravid, appropriate for gestational age.  Pain/Pressure: Present     Pelvic: Cervical exam deferred        Extremities: Normal range of motion.  Edema: None  Mental Status:  Normal mood and affect. Normal behavior. Normal judgment and thought content.   Assessment and Plan:  Pregnancy: G3P1102 at [redacted]w[redacted]d  1. Supervision of high risk pregnancy, antepartum - Flu Vaccine QUAD 36+ mos IM (Fluarix, Quad PF)  2. Cervical incompetence affecting management of mother, antepartum Cerclage at 19 weeks  Remove cerclage at next visit  3. Chronic hypertension during pregnancy, antepartum BP wnl. No meds  4. Language barrier Spanish interpreter present    5. History of VBAC Desires TOLAC VBAC consent signed today  6. History of eclampsia On baby ASA  7. Abnormal first trimester screen  8. Umbilical cord varix Twice weekly BPP with weekly dopplers,   Preterm labor symptoms and general obstetric precautions including but not limited to vaginal bleeding, contractions, leaking of fluid and fetal movement were reviewed in detail with the patient. Please refer to After Visit Summary for other counseling recommendations.  Return in about 1 week (around 08/08/2017).   Lavonia Drafts, MD

## 2017-08-05 ENCOUNTER — Ambulatory Visit (HOSPITAL_COMMUNITY)
Admission: RE | Admit: 2017-08-05 | Discharge: 2017-08-05 | Disposition: A | Discharge status: Home / Self Care | Payer: Self-pay | Source: Ambulatory Visit | Attending: Obstetrics & Gynecology | Admitting: Obstetrics & Gynecology

## 2017-08-05 ENCOUNTER — Encounter (HOSPITAL_COMMUNITY): Payer: Self-pay

## 2017-08-05 DIAGNOSIS — O3433 Maternal care for cervical incompetence, third trimester: Secondary | ICD-10-CM | POA: Insufficient documentation

## 2017-08-05 DIAGNOSIS — O09293 Supervision of pregnancy with other poor reproductive or obstetric history, third trimester: Secondary | ICD-10-CM | POA: Insufficient documentation

## 2017-08-05 DIAGNOSIS — O10919 Unspecified pre-existing hypertension complicating pregnancy, unspecified trimester: Secondary | ICD-10-CM

## 2017-08-05 DIAGNOSIS — O289 Unspecified abnormal findings on antenatal screening of mother: Secondary | ICD-10-CM | POA: Insufficient documentation

## 2017-08-05 DIAGNOSIS — Z3A35 35 weeks gestation of pregnancy: Secondary | ICD-10-CM | POA: Insufficient documentation

## 2017-08-05 DIAGNOSIS — E669 Obesity, unspecified: Secondary | ICD-10-CM | POA: Insufficient documentation

## 2017-08-05 DIAGNOSIS — O34211 Maternal care for low transverse scar from previous cesarean delivery: Secondary | ICD-10-CM | POA: Insufficient documentation

## 2017-08-05 DIAGNOSIS — O99213 Obesity complicating pregnancy, third trimester: Secondary | ICD-10-CM | POA: Insufficient documentation

## 2017-08-05 DIAGNOSIS — O10013 Pre-existing essential hypertension complicating pregnancy, third trimester: Secondary | ICD-10-CM | POA: Insufficient documentation

## 2017-08-05 NOTE — Addendum Note (Signed)
Encounter addended by: Ladora Daniel E on: 08/05/2017 10:20 AM<BR>    Actions taken: Imaging Exam ended

## 2017-08-06 ENCOUNTER — Other Ambulatory Visit: Payer: Self-pay | Admitting: Advanced Practice Midwife

## 2017-08-06 ENCOUNTER — Encounter: Payer: Self-pay | Admitting: *Deleted

## 2017-08-07 ENCOUNTER — Ambulatory Visit (INDEPENDENT_AMBULATORY_CARE_PROVIDER_SITE_OTHER): Payer: Self-pay | Admitting: Obstetrics & Gynecology

## 2017-08-07 VITALS — BP 130/51 | HR 65 | Wt 210.1 lb

## 2017-08-07 DIAGNOSIS — O3430 Maternal care for cervical incompetence, unspecified trimester: Secondary | ICD-10-CM

## 2017-08-07 DIAGNOSIS — O34219 Maternal care for unspecified type scar from previous cesarean delivery: Secondary | ICD-10-CM

## 2017-08-07 DIAGNOSIS — O0990 Supervision of high risk pregnancy, unspecified, unspecified trimester: Secondary | ICD-10-CM

## 2017-08-07 DIAGNOSIS — Z113 Encounter for screening for infections with a predominantly sexual mode of transmission: Secondary | ICD-10-CM

## 2017-08-07 DIAGNOSIS — O0993 Supervision of high risk pregnancy, unspecified, third trimester: Secondary | ICD-10-CM

## 2017-08-07 DIAGNOSIS — O3433 Maternal care for cervical incompetence, third trimester: Secondary | ICD-10-CM

## 2017-08-07 DIAGNOSIS — Z98891 History of uterine scar from previous surgery: Secondary | ICD-10-CM

## 2017-08-07 NOTE — Progress Notes (Signed)
   PRENATAL VISIT NOTE  Subjective:  Bonnie Morales is a 36 y.o. G3P1102 at [redacted]w[redacted]d being seen today for ongoing prenatal care.  She is currently monitored for the following issues for this high-risk pregnancy and has Supervision of high risk pregnancy, antepartum; Chronic hypertension during pregnancy, antepartum; Abnormal first trimester screen; History of VBAC; History of eclampsia; Cervical incompetence affecting management of mother, antepartum; Language barrier; and Pregnancy complicated by umbilical cord varix in antepartum period on her problem list.  Patient reports no complaints.  Contractions: Not present. Vag. Bleeding: None.  Movement: Present. Denies leaking of fluid.   The following portions of the patient's history were reviewed and updated as appropriate: allergies, current medications, past family history, past medical history, past social history, past surgical history and problem list. Problem list updated.  Objective:   Vitals:   08/07/17 1535  BP: (!) 130/51  Pulse: 65  Weight: 210 lb 1.6 oz (95.3 kg)    Fetal Status: Fetal Heart Rate (bpm): 138   Movement: Present    Video interpretor used for the visit. General:  Alert, oriented and cooperative. Patient is in no acute distress.  Skin: Skin is warm and dry. No rash noted.   Cardiovascular: Normal heart rate noted  Respiratory: Normal respiratory effort, no problems with respiration noted  Abdomen: Soft, gravid, appropriate for gestational age.  Pain/Pressure: Present     Pelvic: Cervical exam deferred        Extremities: Normal range of motion.  Edema: None  Mental Status:  Normal mood and affect. Normal behavior. Normal judgment and thought content.   Assessment and Plan:  Pregnancy: G3P1102 at [redacted]w[redacted]d  1. Cervical incompetence affecting management of mother, antepartum - I removed her entire cerclage without difficulty. Some bleeding noted.  2. History of VBAC   3. Supervision of high risk pregnancy,  antepartum - Culture, beta strep (group b only) - GC/Chlamydia probe amp ([place])not at [facility] - Continue twice weekly testing  Preterm labor symptoms and general obstetric precautions including but not limited to vaginal bleeding, contractions, leaking of fluid and fetal movement were reviewed in detail with the patient. Please refer to After Visit Summary for other counseling recommendations.  Return in about 1 week (around 08/14/2017) for Continue twice weekly testing.   Emily Filbert, MD

## 2017-08-08 ENCOUNTER — Other Ambulatory Visit (HOSPITAL_COMMUNITY): Payer: Self-pay | Admitting: Maternal and Fetal Medicine

## 2017-08-08 ENCOUNTER — Ambulatory Visit (HOSPITAL_COMMUNITY)
Admission: RE | Admit: 2017-08-08 | Discharge: 2017-08-08 | Disposition: A | Discharge status: Home / Self Care | Payer: Self-pay | Source: Ambulatory Visit | Attending: Obstetrics & Gynecology | Admitting: Obstetrics & Gynecology

## 2017-08-08 ENCOUNTER — Encounter (HOSPITAL_COMMUNITY): Payer: Self-pay

## 2017-08-08 ENCOUNTER — Telehealth (HOSPITAL_COMMUNITY): Payer: Self-pay | Admitting: *Deleted

## 2017-08-08 DIAGNOSIS — O09299 Supervision of pregnancy with other poor reproductive or obstetric history, unspecified trimester: Secondary | ICD-10-CM

## 2017-08-08 DIAGNOSIS — O289 Unspecified abnormal findings on antenatal screening of mother: Secondary | ICD-10-CM | POA: Insufficient documentation

## 2017-08-08 DIAGNOSIS — O358XX0 Maternal care for other (suspected) fetal abnormality and damage, not applicable or unspecified: Secondary | ICD-10-CM

## 2017-08-08 DIAGNOSIS — Z3A36 36 weeks gestation of pregnancy: Secondary | ICD-10-CM | POA: Insufficient documentation

## 2017-08-08 DIAGNOSIS — E669 Obesity, unspecified: Secondary | ICD-10-CM | POA: Insufficient documentation

## 2017-08-08 DIAGNOSIS — O3433 Maternal care for cervical incompetence, third trimester: Secondary | ICD-10-CM

## 2017-08-08 DIAGNOSIS — O34219 Maternal care for unspecified type scar from previous cesarean delivery: Secondary | ICD-10-CM

## 2017-08-08 DIAGNOSIS — O281 Abnormal biochemical finding on antenatal screening of mother: Secondary | ICD-10-CM

## 2017-08-08 DIAGNOSIS — O34211 Maternal care for low transverse scar from previous cesarean delivery: Secondary | ICD-10-CM | POA: Insufficient documentation

## 2017-08-08 DIAGNOSIS — O10919 Unspecified pre-existing hypertension complicating pregnancy, unspecified trimester: Secondary | ICD-10-CM

## 2017-08-08 DIAGNOSIS — O10013 Pre-existing essential hypertension complicating pregnancy, third trimester: Secondary | ICD-10-CM | POA: Insufficient documentation

## 2017-08-08 DIAGNOSIS — O99213 Obesity complicating pregnancy, third trimester: Secondary | ICD-10-CM | POA: Insufficient documentation

## 2017-08-08 DIAGNOSIS — O09293 Supervision of pregnancy with other poor reproductive or obstetric history, third trimester: Secondary | ICD-10-CM | POA: Insufficient documentation

## 2017-08-08 LAB — GC/CHLAMYDIA PROBE AMP (~~LOC~~) NOT AT ARMC
CHLAMYDIA, DNA PROBE: NEGATIVE
Neisseria Gonorrhea: NEGATIVE

## 2017-08-08 NOTE — Telephone Encounter (Signed)
Preadmission screen Interpreter number [phone]

## 2017-08-09 ENCOUNTER — Other Ambulatory Visit (HOSPITAL_COMMUNITY): Payer: Self-pay | Admitting: *Deleted

## 2017-08-09 DIAGNOSIS — IMO0001 Reserved for inherently not codable concepts without codable children: Secondary | ICD-10-CM

## 2017-08-11 LAB — CULTURE, BETA STREP (GROUP B ONLY): Strep Gp B Culture: NEGATIVE

## 2017-08-12 ENCOUNTER — Other Ambulatory Visit: Payer: Self-pay | Admitting: Obstetrics and Gynecology

## 2017-08-12 ENCOUNTER — Encounter (HOSPITAL_COMMUNITY): Payer: Self-pay

## 2017-08-12 ENCOUNTER — Ambulatory Visit (HOSPITAL_COMMUNITY)
Admission: RE | Admit: 2017-08-12 | Discharge: 2017-08-12 | Disposition: A | Discharge status: Home / Self Care | Payer: Self-pay | Source: Ambulatory Visit | Attending: Nurse Practitioner | Admitting: Nurse Practitioner

## 2017-08-12 ENCOUNTER — Other Ambulatory Visit (HOSPITAL_COMMUNITY): Payer: Self-pay | Admitting: Obstetrics and Gynecology

## 2017-08-12 DIAGNOSIS — O99210 Obesity complicating pregnancy, unspecified trimester: Secondary | ICD-10-CM | POA: Insufficient documentation

## 2017-08-12 DIAGNOSIS — O99213 Obesity complicating pregnancy, third trimester: Secondary | ICD-10-CM

## 2017-08-12 DIAGNOSIS — Z3A36 36 weeks gestation of pregnancy: Secondary | ICD-10-CM

## 2017-08-12 DIAGNOSIS — O10019 Pre-existing essential hypertension complicating pregnancy, unspecified trimester: Secondary | ICD-10-CM | POA: Insufficient documentation

## 2017-08-12 DIAGNOSIS — O09299 Supervision of pregnancy with other poor reproductive or obstetric history, unspecified trimester: Secondary | ICD-10-CM | POA: Insufficient documentation

## 2017-08-12 DIAGNOSIS — O34219 Maternal care for unspecified type scar from previous cesarean delivery: Secondary | ICD-10-CM | POA: Insufficient documentation

## 2017-08-12 DIAGNOSIS — O289 Unspecified abnormal findings on antenatal screening of mother: Secondary | ICD-10-CM | POA: Insufficient documentation

## 2017-08-12 DIAGNOSIS — IMO0001 Reserved for inherently not codable concepts without codable children: Secondary | ICD-10-CM

## 2017-08-12 DIAGNOSIS — O10013 Pre-existing essential hypertension complicating pregnancy, third trimester: Secondary | ICD-10-CM

## 2017-08-12 DIAGNOSIS — O3433 Maternal care for cervical incompetence, third trimester: Secondary | ICD-10-CM | POA: Insufficient documentation

## 2017-08-12 DIAGNOSIS — Z6834 Body mass index (BMI) 34.0-34.9, adult: Secondary | ICD-10-CM | POA: Insufficient documentation

## 2017-08-12 DIAGNOSIS — E669 Obesity, unspecified: Secondary | ICD-10-CM | POA: Insufficient documentation

## 2017-08-13 ENCOUNTER — Other Ambulatory Visit (HOSPITAL_COMMUNITY): Payer: Self-pay | Admitting: *Deleted

## 2017-08-13 DIAGNOSIS — IMO0001 Reserved for inherently not codable concepts without codable children: Secondary | ICD-10-CM

## 2017-08-15 ENCOUNTER — Ambulatory Visit (HOSPITAL_COMMUNITY)
Admission: RE | Admit: 2017-08-15 | Discharge: 2017-08-15 | Disposition: A | Discharge status: Home / Self Care | Payer: Self-pay | Source: Ambulatory Visit | Attending: Nurse Practitioner | Admitting: Nurse Practitioner

## 2017-08-15 ENCOUNTER — Encounter (HOSPITAL_COMMUNITY): Payer: Self-pay

## 2017-08-15 ENCOUNTER — Other Ambulatory Visit (HOSPITAL_COMMUNITY): Payer: Self-pay | Admitting: Maternal and Fetal Medicine

## 2017-08-15 DIAGNOSIS — Z8759 Personal history of other complications of pregnancy, childbirth and the puerperium: Secondary | ICD-10-CM

## 2017-08-15 DIAGNOSIS — O10013 Pre-existing essential hypertension complicating pregnancy, third trimester: Secondary | ICD-10-CM | POA: Insufficient documentation

## 2017-08-15 DIAGNOSIS — O3433 Maternal care for cervical incompetence, third trimester: Secondary | ICD-10-CM | POA: Insufficient documentation

## 2017-08-15 DIAGNOSIS — O99213 Obesity complicating pregnancy, third trimester: Secondary | ICD-10-CM

## 2017-08-15 DIAGNOSIS — Z3A37 37 weeks gestation of pregnancy: Secondary | ICD-10-CM

## 2017-08-15 DIAGNOSIS — O289 Unspecified abnormal findings on antenatal screening of mother: Secondary | ICD-10-CM

## 2017-08-15 DIAGNOSIS — O10019 Pre-existing essential hypertension complicating pregnancy, unspecified trimester: Secondary | ICD-10-CM

## 2017-08-15 DIAGNOSIS — O0990 Supervision of high risk pregnancy, unspecified, unspecified trimester: Secondary | ICD-10-CM

## 2017-08-15 DIAGNOSIS — O09293 Supervision of pregnancy with other poor reproductive or obstetric history, third trimester: Secondary | ICD-10-CM | POA: Insufficient documentation

## 2017-08-15 DIAGNOSIS — IMO0001 Reserved for inherently not codable concepts without codable children: Secondary | ICD-10-CM

## 2017-08-15 DIAGNOSIS — E669 Obesity, unspecified: Secondary | ICD-10-CM | POA: Insufficient documentation

## 2017-08-15 DIAGNOSIS — O34211 Maternal care for low transverse scar from previous cesarean delivery: Secondary | ICD-10-CM | POA: Insufficient documentation

## 2017-08-15 DIAGNOSIS — Z98891 History of uterine scar from previous surgery: Secondary | ICD-10-CM

## 2017-08-16 ENCOUNTER — Inpatient Hospital Stay (HOSPITAL_COMMUNITY): Admission: RE | Admit: 2017-08-16 | Payer: Self-pay | Source: Ambulatory Visit | Admitting: Obstetrics and Gynecology

## 2017-08-17 ENCOUNTER — Inpatient Hospital Stay (HOSPITAL_COMMUNITY): Payer: Medicaid Other | Admitting: Anesthesiology

## 2017-08-17 ENCOUNTER — Encounter (HOSPITAL_COMMUNITY): Payer: Self-pay

## 2017-08-17 ENCOUNTER — Inpatient Hospital Stay (HOSPITAL_COMMUNITY)
Admission: RE | Admit: 2017-08-17 | Discharge: 2017-08-19 | DRG: 805 | Disposition: A | Discharge status: Home / Self Care | Payer: Medicaid Other | Source: Ambulatory Visit | Attending: Obstetrics & Gynecology | Admitting: Obstetrics & Gynecology

## 2017-08-17 DIAGNOSIS — D649 Anemia, unspecified: Secondary | ICD-10-CM | POA: Diagnosis present

## 2017-08-17 DIAGNOSIS — Z98891 History of uterine scar from previous surgery: Secondary | ICD-10-CM

## 2017-08-17 DIAGNOSIS — O1092 Unspecified pre-existing hypertension complicating childbirth: Secondary | ICD-10-CM

## 2017-08-17 DIAGNOSIS — Z7982 Long term (current) use of aspirin: Secondary | ICD-10-CM

## 2017-08-17 DIAGNOSIS — O34211 Maternal care for low transverse scar from previous cesarean delivery: Secondary | ICD-10-CM | POA: Diagnosis present

## 2017-08-17 DIAGNOSIS — O289 Unspecified abnormal findings on antenatal screening of mother: Secondary | ICD-10-CM

## 2017-08-17 DIAGNOSIS — O1002 Pre-existing essential hypertension complicating childbirth: Secondary | ICD-10-CM | POA: Diagnosis present

## 2017-08-17 DIAGNOSIS — Z3A37 37 weeks gestation of pregnancy: Secondary | ICD-10-CM

## 2017-08-17 DIAGNOSIS — Z8759 Personal history of other complications of pregnancy, childbirth and the puerperium: Secondary | ICD-10-CM

## 2017-08-17 DIAGNOSIS — O9902 Anemia complicating childbirth: Secondary | ICD-10-CM | POA: Diagnosis present

## 2017-08-17 DIAGNOSIS — Z87891 Personal history of nicotine dependence: Secondary | ICD-10-CM

## 2017-08-17 DIAGNOSIS — O99891 Other specified diseases and conditions complicating pregnancy: Secondary | ICD-10-CM | POA: Diagnosis present

## 2017-08-17 DIAGNOSIS — O3433 Maternal care for cervical incompetence, third trimester: Secondary | ICD-10-CM | POA: Diagnosis present

## 2017-08-17 DIAGNOSIS — O26899 Other specified pregnancy related conditions, unspecified trimester: Secondary | ICD-10-CM | POA: Diagnosis present

## 2017-08-17 DIAGNOSIS — O0990 Supervision of high risk pregnancy, unspecified, unspecified trimester: Secondary | ICD-10-CM

## 2017-08-17 DIAGNOSIS — IMO0001 Reserved for inherently not codable concepts without codable children: Secondary | ICD-10-CM

## 2017-08-17 LAB — CBC
HCT: 34.8 % — ABNORMAL LOW (ref 36.0–46.0)
HEMATOCRIT: 33.8 % — AB (ref 36.0–46.0)
HEMATOCRIT: 35.5 % — AB (ref 36.0–46.0)
HEMOGLOBIN: 11.6 g/dL — AB (ref 12.0–15.0)
HEMOGLOBIN: 12.3 g/dL (ref 12.0–15.0)
Hemoglobin: 12.3 g/dL (ref 12.0–15.0)
MCH: 31.5 pg (ref 26.0–34.0)
MCH: 30.8 pg (ref 26.0–34.0)
MCH: 30.9 pg (ref 26.0–34.0)
MCHC: 35.3 g/dL (ref 30.0–36.0)
MCHC: 34.3 g/dL (ref 30.0–36.0)
MCHC: 34.6 g/dL (ref 30.0–36.0)
MCV: 89 fL (ref 78.0–100.0)
MCV: 89.7 fL (ref 78.0–100.0)
MCV: 89.2 fL (ref 78.0–100.0)
PLATELETS: 205 10*3/uL (ref 150–400)
PLATELETS: 212 10*3/uL (ref 150–400)
Platelets: 203 10*3/uL (ref 150–400)
RBC: 3.91 MIL/uL (ref 3.87–5.11)
RBC: 3.77 MIL/uL — AB (ref 3.87–5.11)
RBC: 3.98 MIL/uL (ref 3.87–5.11)
RDW: 14.8 % (ref 11.5–15.5)
RDW: 14.7 % (ref 11.5–15.5)
RDW: 14.5 % (ref 11.5–15.5)
WBC: 7.1 10*3/uL (ref 4.0–10.5)
WBC: 7.4 10*3/uL (ref 4.0–10.5)
WBC: 12.7 10*3/uL — AB (ref 4.0–10.5)

## 2017-08-17 LAB — TYPE AND SCREEN
ABO/RH(D): O POS
Antibody Screen: NEGATIVE

## 2017-08-17 LAB — RPR: RPR Ser Ql: NONREACTIVE

## 2017-08-17 MED ORDER — LACTATED RINGERS IV SOLN: 500.0000 mL | Freq: Once | INTRAVENOUS | Status: DC

## 2017-08-17 MED ORDER — FENTANYL 2.5 MCG/ML BUPIVACAINE 1/10 % EPIDURAL INFUSION (WH - ANES)
INTRAMUSCULAR | Status: AC
  Filled 2017-08-17: qty 100

## 2017-08-17 MED ORDER — OXYTOCIN 40 UNITS IN LACTATED RINGERS INFUSION - SIMPLE MED: 2.5000 [IU]/h | INTRAVENOUS | Status: DC

## 2017-08-17 MED ORDER — OXYTOCIN BOLUS FROM INFUSION
500.0000 mL | Freq: Once | INTRAVENOUS | Status: AC
  Administered 2017-08-17: 500 mL via INTRAVENOUS

## 2017-08-17 MED ORDER — OXYTOCIN 40 UNITS IN LACTATED RINGERS INFUSION - SIMPLE MED
1.0000 m[IU]/min | INTRAVENOUS | Status: DC
  Administered 2017-08-17: 2 m[IU]/min via INTRAVENOUS
  Filled 2017-08-17: qty 1000

## 2017-08-17 MED ORDER — FENTANYL CITRATE (PF) 100 MCG/2ML IJ SOLN: 100.0000 ug | INTRAMUSCULAR | Status: DC | PRN

## 2017-08-17 MED ORDER — EPHEDRINE 5 MG/ML INJ
10.0000 mg | INTRAVENOUS | Status: DC | PRN
  Filled 2017-08-17: qty 2

## 2017-08-17 MED ORDER — SOD CITRATE-CITRIC ACID 500-334 MG/5ML PO SOLN: 30.0000 mL | ORAL | Status: DC | PRN

## 2017-08-17 MED ORDER — DIPHENHYDRAMINE HCL 50 MG/ML IJ SOLN: 12.5000 mg | INTRAMUSCULAR | Status: DC | PRN

## 2017-08-17 MED ORDER — LIDOCAINE HCL (PF) 1 % IJ SOLN
INTRAMUSCULAR | Status: DC | PRN
  Administered 2017-08-17 (×2): 4 mL via EPIDURAL

## 2017-08-17 MED ORDER — ONDANSETRON HCL 4 MG/2ML IJ SOLN
4.0000 mg | Freq: Four times a day (QID) | INTRAMUSCULAR | Status: DC | PRN
Start: 2017-08-17 — End: 2017-08-18

## 2017-08-17 MED ORDER — TERBUTALINE SULFATE 1 MG/ML IJ SOLN
0.2500 mg | Freq: Once | INTRAMUSCULAR | Status: DC | PRN
  Filled 2017-08-17: qty 1

## 2017-08-17 MED ORDER — PHENYLEPHRINE 40 MCG/ML (10ML) SYRINGE FOR IV PUSH (FOR BLOOD PRESSURE SUPPORT)
80.0000 ug | PREFILLED_SYRINGE | INTRAVENOUS | Status: DC | PRN
  Filled 2017-08-17: qty 5

## 2017-08-17 MED ORDER — FLEET ENEMA 7-19 GM/118ML RE ENEM: 1.0000 | ENEMA | RECTAL | Status: DC | PRN

## 2017-08-17 MED ORDER — PHENYLEPHRINE 40 MCG/ML (10ML) SYRINGE FOR IV PUSH (FOR BLOOD PRESSURE SUPPORT)
PREFILLED_SYRINGE | INTRAVENOUS | Status: AC
  Filled 2017-08-17: qty 10

## 2017-08-17 MED ORDER — OXYCODONE-ACETAMINOPHEN 5-325 MG PO TABS: 2.0000 | ORAL_TABLET | ORAL | Status: DC | PRN

## 2017-08-17 MED ORDER — LIDOCAINE HCL (PF) 1 % IJ SOLN
30.0000 mL | INTRAMUSCULAR | Status: DC | PRN
  Filled 2017-08-17: qty 30

## 2017-08-17 MED ORDER — LACTATED RINGERS IV SOLN
INTRAVENOUS | Status: DC
  Administered 2017-08-17 (×2): via INTRAVENOUS
  Administered 2017-08-17: 125 mL/h via INTRAVENOUS

## 2017-08-17 MED ORDER — LACTATED RINGERS IV SOLN: 500.0000 mL | INTRAVENOUS | Status: DC | PRN

## 2017-08-17 MED ORDER — FENTANYL 2.5 MCG/ML BUPIVACAINE 1/10 % EPIDURAL INFUSION (WH - ANES)
14.0000 mL/h | INTRAMUSCULAR | Status: DC | PRN
  Administered 2017-08-17: 14 mL/h via EPIDURAL

## 2017-08-17 MED ORDER — LACTATED RINGERS IV SOLN
500.0000 mL | Freq: Once | INTRAVENOUS | Status: AC
  Administered 2017-08-17: 500 mL via INTRAVENOUS

## 2017-08-17 MED ORDER — LACTATED RINGERS IV SOLN
INTRAVENOUS | Status: DC
  Administered 2017-08-17: 150 mL/h via INTRAUTERINE

## 2017-08-17 MED ORDER — LABETALOL HCL 100 MG PO TABS
100.0000 mg | ORAL_TABLET | Freq: Two times a day (BID) | ORAL | Status: DC
  Administered 2017-08-17 – 2017-08-18 (×2): 100 mg via ORAL
  Filled 2017-08-17 (×2): qty 1

## 2017-08-17 MED ORDER — ACETAMINOPHEN 325 MG PO TABS: 650.0000 mg | ORAL_TABLET | ORAL | Status: DC | PRN

## 2017-08-17 MED ORDER — OXYCODONE-ACETAMINOPHEN 5-325 MG PO TABS: 1.0000 | ORAL_TABLET | ORAL | Status: DC | PRN

## 2017-08-17 NOTE — Progress Notes (Signed)
Labor Progress Note  Bonnie Morales is a 36 y.o. [id number] at [redacted]w[redacted]d  admitted for induction of labor due to [place]. Prenatal complicated by cHTN, TOLAC.  S: Doing well. Comfortable with epidural.  O:  BP 119/62   Pulse (!) 58   Temp 97.9 F (36.6 C) (Oral)   Resp 16   Ht 5\' 5"  (1.651 m)   Wt 97.1 kg (214 lb)   LMP 11/27/2016   BMI 35.61 kg/m   No intake/output data recorded.  FHT:  FHR: 125 bpm, variability: moderate,  accelerations:  Present,  decelerations:  Absent UC:   regular, every 2-3 minutes SVE:   Dilation: 6 Effacement (%): 80 Station: -2 Exam by:: Dr Gerarda Fraction AROM: clear @ 1740  Pitocin @ 52mu/min   Labs: Lab Results  Component Value Date   WBC 7.4 08/17/2017   HGB 11.6 (L) 08/17/2017   HCT 33.8 (L) 08/17/2017   MCV 89.7 08/17/2017   PLT 203 08/17/2017    Assessment / Plan: 36 y.o. [id number] [redacted]w[redacted]d in early labor Induction of labor due to UVV   Labor: Progressing normally , Continue pitocin for augmentation, AROM'd and IUPC placed CHTN: continue Labetolol 100mg  BID; BPs controlled  Fetal Wellbeing:  Category I Pain Control:  Epidural Anticipated MOD:  NSVD  Expectant management   Luiz Blare, DO OB Fellow

## 2017-08-17 NOTE — Progress Notes (Signed)
Labor Progress Note  Bonnie Morales is a 36 y.o. [id number] at [redacted]w[redacted]d  admitted for induction of labor due to [place]. Prenatal complicated by cHTN, TOLAC.  S: Doing well. Felling a little more pain.   O:  BP 117/64   Pulse 63   Temp 98.2 F (36.8 C) (Oral)   Resp 16   Ht 5\' 5"  (1.651 m)   Wt 97.1 kg (214 lb)   LMP 11/27/2016   BMI 35.61 kg/m   No intake/output data recorded.  FHT:  FHR: 125 bpm, variability: moderate,  accelerations:  Present,  decelerations:  Absent UC:   regular, every 3-4 minutes SVE:   Dilation: 1.5 Effacement (%): 20 Station: -3 Exam by:: s grindstaff rn Intact   Labs: Lab Results  Component Value Date   WBC 7.1 08/17/2017   HGB 12.3 08/17/2017   HCT 34.8 (L) 08/17/2017   MCV 89.0 08/17/2017   PLT 205 08/17/2017    Assessment / Plan: 36 y.o. [id number] [redacted]w[redacted]d in early labor Induction of labor due to UVV  Labor: Progressing normally , FB now out; start pitocin 2x2 CHTN: continue Labetolol 100mg  BID; BPs controlled  Fetal Wellbeing:  Category I Pain Control:  Plan for Epidural Anticipated MOD:  NSVD  Expectant management   Luiz Blare, DO OB Fellow

## 2017-08-17 NOTE — Anesthesia Procedure Notes (Signed)
Epidural Patient location during procedure: OB Start time: 08/17/2017 4:56 PM  Staffing Anesthesiologist: Josephine Igo Performed: anesthesiologist   Preanesthetic Checklist Completed: patient identified, site marked, surgical consent, pre-op evaluation, timeout performed, IV checked, risks and benefits discussed and monitors and equipment checked  Epidural Patient position: sitting Prep: site prepped and draped and DuraPrep Patient monitoring: continuous pulse ox and blood pressure Approach: midline Location: L3-L4 Injection technique: LOR air  Needle:  Needle type: Tuohy  Needle gauge: 17 G Needle length: 9 cm and 9 Needle insertion depth: 5 cm cm Catheter type: closed end flexible Catheter size: 19 Gauge Catheter at skin depth: 10 cm Test dose: negative and Other  Assessment Events: blood not aspirated, injection not painful, no injection resistance, negative IV test and no paresthesia  Additional Notes Patient identified. Risks and benefits discussed including failed block, incomplete  Pain control, post dural puncture headache, nerve damage, paralysis, blood pressure Changes, nausea, vomiting, reactions to medications-both toxic and allergic and post Partum back pain. All questions were answered. Patient expressed understanding and wished to proceed. Sterile technique was used throughout procedure. Epidural site was Dressed with sterile barrier dressing. No paresthesias, signs of intravascular injection Or signs of intrathecal spread were encountered.  Patient was more comfortable after the epidural was dosed. Please see RN's note for documentation of vital signs and FHR which are stable.

## 2017-08-17 NOTE — Anesthesia Preprocedure Evaluation (Signed)
Anesthesia Evaluation  Patient identified by MRN, date of birth, ID band Patient awake    Reviewed: Allergy & Precautions, Patient's Chart, lab work & pertinent test results, reviewed documented beta blocker date and time   Airway Mallampati: III  TM Distance: >3 FB Neck ROM: Full    Dental no notable dental hx. (+) Teeth Intact   Pulmonary former smoker,    Pulmonary exam normal breath sounds clear to auscultation       Cardiovascular hypertension, Pt. on medications and Pt. on home beta blockers Normal cardiovascular exam Rhythm:Regular Rate:Normal     Neuro/Psych negative neurological ROS  negative psych ROS   GI/Hepatic Neg liver ROS, GERD  Medicated and Controlled,  Endo/Other  Obesity  Renal/GU negative Renal ROS  negative genitourinary   Musculoskeletal negative musculoskeletal ROS (+)   Abdominal (+) + obese,   Peds  Hematology  (+) anemia ,   Anesthesia Other Findings   Reproductive/Obstetrics (+) Pregnancy                             Anesthesia Physical Anesthesia Plan  ASA: III  Anesthesia Plan: Epidural   Post-op Pain Management:    Induction:   PONV Risk Score and Plan:   Airway Management Planned: Natural Airway  Additional Equipment:   Intra-op Plan:   Post-operative Plan:   Informed Consent: I have reviewed the patients History and Physical, chart, labs and discussed the procedure including the risks, benefits and alternatives for the proposed anesthesia with the patient or authorized representative who has indicated his/her understanding and acceptance.     Plan Discussed with: Anesthesiologist  Anesthesia Plan Comments:         Anesthesia Quick Evaluation

## 2017-08-17 NOTE — Progress Notes (Signed)
Using pacific interpreter via phone for translation to complete admission

## 2017-08-17 NOTE — Progress Notes (Signed)
OB Interim Progress Note  S: Uncomfortable, but in NAD  O: BP 124/67   Pulse (!) 59   Temp 98.1 F (36.7 C) (Axillary)   Resp 15   Ht 5\' 5"  (1.651 m)   Wt 97.1 kg (214 lb)   LMP 11/27/2016   BMI 35.61 kg/m    Dilation: 9 Effacement (%): 100 Cervical Position: Middle Station: 0 Presentation: Vertex Exam by:: Gerarda Fraction MD   FHT: 125BPM, mod variability, pos accels, no decels Contractions 2-5min  A/P: Progressed to 9/100/0. Patient is uncomfortable, but in NAD and BPs WNL.  Continue expectant management Anticipate SVD  Eloise Levels, MD 08/17/2017, 8:23 PM PGY-2

## 2017-08-17 NOTE — Progress Notes (Signed)
[place] interpreters used via phone before, during, and after procedure. Pt verbalized understanding and had no questions; pt tolerated foley bulb placement well

## 2017-08-17 NOTE — Anesthesia Pain Management Evaluation Note (Signed)
  CRNA Pain Management Visit Note  Patient: Bonnie Morales, 36 y.o., female  "Hello I am a member of the anesthesia team at [facility]. We have an anesthesia team available at all times to provide care throughout the hospital, including epidural management and anesthesia for C-section. I don't know your plan for the delivery whether it a natural birth, water birth, IV sedation, nitrous supplementation, doula or epidural, but we want to meet your pain goals."   1.Was your pain managed to your expectations on prior hospitalizations?   Yes   2.What is your expectation for pain management during this hospitalization?     Epidural  3.How can we help you reach that goal? epidural  Record the patient's initial score and the patient's pain goal.   Pain: 0  Pain Goal: 4 [facility] wants you to be able to say your pain was always managed very well.  Bonnie Morales 08/17/2017

## 2017-08-17 NOTE — H&P (Signed)
LABOR AND DELIVERY ADMISSION HISTORY AND PHYSICAL NOTE  Bonnie Morales is a 36 y.o. female [phone] with IUP at 104w4d by LMP presenting for IOL for cHTN.  She has cervical incompetence and had cerclage removed on 9/26 without difficulty. Also with hx of PTD and successful VBAC. [facility] Since her last visit she denies any CP, SOB, changes in vision or headache.   She reports positive fetal movement. She denies leakage of fluid or vaginal bleeding.  Prenatal History/Complications:  Past Medical History: Past Medical History:  Diagnosis Date  . History of kidney stones    passed stone  . Hypertension    2014  . Pre-eclampsia     Past Surgical History: Past Surgical History:  Procedure Laterality Date  . CERVICAL CERCLAGE N/A 04/12/2017   Procedure: CERCLAGE CERVICAL;  Surgeon: Chancy Milroy, MD;  Location: [facility];  Service: Gynecology;  Laterality: N/A;  . CESAREAN SECTION     epidural - in [place]    Obstetrical History: OB History    Gravida Para Term Preterm AB Living   3 2 1 1  0 2   SAB TAB Ectopic Multiple Live Births   0 0 0 0 2      Social History: Social History   Social History  . Marital status: Single    Spouse name: N/A  . Number of children: N/A  . Years of education: N/A   Social History Main Topics  . Smoking status: Former Smoker    Packs/day: 0.25    Years: 5.00    Types: Cigarettes    Quit date: 09/12/2016  . Smokeless tobacco: Never Used  . Alcohol use No  . Drug use: No  . Sexual activity: Yes    Birth control/ protection: None     Comment: approx [redacted] weeks gestation   Other Topics Concern  . None   Social History Narrative  . None    Family History: Family History  Problem Relation Age of Onset  . Diabetes Mother   . Hypertension Mother   . Heart disease Mother        tachycardia  . Hypertension Father     Allergies: Allergies  Allergen Reactions  . Sulfa Antibiotics Hives    Reports high fever and red rash on arms    Prescriptions Prior to Admission  Medication Sig Dispense Refill Last Dose  . aspirin 81 MG tablet Take 81 mg by mouth daily.   Not Taking  . labetalol (NORMODYNE) 100 MG tablet Take 100 mg by mouth 2 (two) times daily.   Taking  . Prenatal Vit-Fe Fumarate-FA (PRENATAL MULTIVITAMIN) TABS tablet Take 1 tablet by mouth daily at 12 noon.   Taking  . progesterone (PROMETRIUM) 200 MG capsule Place 200 mg vaginally at bedtime.   Not Taking     Review of Systems   All systems reviewed and negative except as stated in HPI  Height 5\' 5"  (1.651 m), weight 97.1 kg (214 lb), last menstrual period 11/27/2016, unknown if currently breastfeeding. General appearance: alert Lungs: clear to auscultation bilaterally Heart: regular rate and rhythm Abdomen: soft, non-tender; bowel sounds normal Extremities: No calf swelling or tenderness Presentation: cephalic vertex Fetal monitoring: 135BPM, mod variability, pos accels, no decels Uterine activity: no contractions Dilation: 1.5 Effacement (%): 20 Station: -3 Exam by:: Rosana Berger, MD  Prenatal labs: ABO, Rh: --/--/O POS (10/06 0208) Antibody: PENDING (10/06 0208) Rubella: Immune (04/02 0000) RPR: Non Reactive (  08/06 0907)  HBsAg: Negative (04/02 0000)  HIV: Non-reactive (04/02 0000)  GBS:   neg 1 hr Glucola: third trimester neg Genetic screening:  At risk for downs Anatomy US: normal  Prenatal Transfer Tool  Maternal Diabetes: No Genetic Screening: Abnormal:  Results: Elevated risk of Trisomy 21 Maternal Ultrasounds/Referrals: Abnormal:  Findings:   Other: Umbilical vein                         varix 1.2cm Fetal Ultrasounds or other Referrals:  Referred to Materal Fetal Medicine  Maternal Substance Abuse:  No Significant Maternal Medications:  Meds include: Other:  Labetalol Significant Maternal Lab Results: Lab values include: Group B Strep negative  Results for orders placed or performed  during the hospital encounter of 08/17/17 (from the past 24 hour(s))  CBC   Collection Time: 08/17/17  2:08 AM  Result Value Ref Range   WBC 7.1 4.0 - 10.5 K/uL   RBC 3.91 3.87 - 5.11 MIL/uL   Hemoglobin 12.3 12.0 - 15.0 g/dL   HCT 34.8 (L) 36.0 - 46.0 %   MCV 89.0 78.0 - 100.0 fL   MCH 31.5 26.0 - 34.0 pg   MCHC 35.3 30.0 - 36.0 g/dL   RDW 14.8 11.5 - 15.5 %   Platelets 205 150 - 400 K/uL  Type and screen   Collection Time: 08/17/17  2:08 AM  Result Value Ref Range   ABO/RH(D) O POS    Antibody Screen PENDING    Sample Expiration 08/20/2017     Patient Active Problem List   Diagnosis Date Noted  . 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 Pregnancy complicated by umbilical cord varix in antepartum period 08/01/2017  . Language barrier 06/17/2017  . Cervical incompetence affecting management of mother, antepartum 05/30/2017  . Abnormal first trimester screen 04/09/2017  . History of VBAC 04/09/2017  . History of eclampsia 04/09/2017  . Chronic hypertension during pregnancy, antepartum 02/27/2017  . Supervision of high risk pregnancy, antepartum 02/26/2017    Assessment: [place] is a 36 y.o. G3P1102 at [redacted]w[redacted]d here for IOL for cHTN, takes labetalol 100mg  BID. Denies symptoms. Will continue BID labetalol. Foley bulb placed without difficulty.   #Labor: IOL with foley bulb #Pain: IV pain medication and nitrous #FWB: Cat 1 #ID:  GBS neg #MOF: breast #MOC: depo #Circ:  NA  Eloise Levels, MD PGY-2 08/17/2017, 3:01 AM   CNM attestation:  I have seen and examined this patient; I agree with above documentation in the resident's note.   Bonnie Morales is a 36 y.o. [phone] here for IOL due to cHTN; also with UVV; TOLAC w/ successful VBAC; cerclage removal recently  PE: BP (!) 114/52   Pulse 73   Temp 98.1 F (36.7 C) (Oral)   Resp 17   Ht 5\' 5"  (1.651 m)   Wt 97.1 kg (214 lb)   LMP 11/27/2016   BMI 35.61 kg/m  Gen:  calm comfortable, NAD Resp: normal effort, no distress Abd: gravid  ROS, labs, PMH reviewed  Plan: Admit to [facility] cx ripening with cervical foley, followed by Pit Continue home Labetalol Anticipate successful VBAC  Bonnie Morales, [place] 08/17/2017, 6:11 AM

## 2017-08-17 NOTE — Progress Notes (Signed)
Interpreter at bedside explaining various modes of pain management (nitorous oxide, iv pain maedication and epidrual) Also explained sve necessary prior to starting Pitocin and why Pitocin is needed

## 2017-08-18 ENCOUNTER — Encounter (HOSPITAL_COMMUNITY): Payer: Self-pay

## 2017-08-18 LAB — CBC
HEMATOCRIT: 30.3 % — AB (ref 36.0–46.0)
HEMOGLOBIN: 10.8 g/dL — AB (ref 12.0–15.0)
MCH: 32 pg (ref 26.0–34.0)
MCHC: 35.6 g/dL (ref 30.0–36.0)
MCV: 89.6 fL (ref 78.0–100.0)
Platelets: 186 10*3/uL (ref 150–400)
RBC: 3.38 MIL/uL — ABNORMAL LOW (ref 3.87–5.11)
RDW: 14.6 % (ref 11.5–15.5)
WBC: 8.6 10*3/uL (ref 4.0–10.5)

## 2017-08-18 MED ORDER — TETANUS-DIPHTH-ACELL PERTUSSIS 5-2.5-18.5 LF-MCG/0.5 IM SUSP: 0.5000 mL | Freq: Once | INTRAMUSCULAR | Status: DC

## 2017-08-18 MED ORDER — ONDANSETRON HCL 4 MG PO TABS: 4.0000 mg | ORAL_TABLET | ORAL | Status: DC | PRN

## 2017-08-18 MED ORDER — SENNOSIDES-DOCUSATE SODIUM 8.6-50 MG PO TABS
2.0000 | ORAL_TABLET | ORAL | Status: DC
  Administered 2017-08-18 – 2017-08-19 (×2): 2 via ORAL
  Filled 2017-08-18 (×2): qty 2

## 2017-08-18 MED ORDER — ZOLPIDEM TARTRATE 5 MG PO TABS: 5.0000 mg | ORAL_TABLET | Freq: Every evening | ORAL | Status: DC | PRN

## 2017-08-18 MED ORDER — PRENATAL MULTIVITAMIN CH
1.0000 | ORAL_TABLET | Freq: Every day | ORAL | Status: DC
  Administered 2017-08-18 – 2017-08-19 (×2): 1 via ORAL
  Filled 2017-08-18 (×2): qty 1

## 2017-08-18 MED ORDER — WITCH HAZEL-GLYCERIN EX PADS: 1.0000 "application " | MEDICATED_PAD | CUTANEOUS | Status: DC | PRN

## 2017-08-18 MED ORDER — ONDANSETRON HCL 4 MG/2ML IJ SOLN: 4.0000 mg | INTRAMUSCULAR | Status: DC | PRN

## 2017-08-18 MED ORDER — IBUPROFEN 600 MG PO TABS
600.0000 mg | ORAL_TABLET | Freq: Four times a day (QID) | ORAL | Status: DC
  Administered 2017-08-18 – 2017-08-19 (×6): 600 mg via ORAL
  Filled 2017-08-18 (×6): qty 1

## 2017-08-18 MED ORDER — ACETAMINOPHEN 325 MG PO TABS
650.0000 mg | ORAL_TABLET | ORAL | Status: DC | PRN
  Administered 2017-08-18 (×2): 650 mg via ORAL
  Filled 2017-08-18 (×2): qty 2

## 2017-08-18 MED ORDER — DIPHENHYDRAMINE HCL 25 MG PO CAPS: 25.0000 mg | ORAL_CAPSULE | Freq: Four times a day (QID) | ORAL | Status: DC | PRN

## 2017-08-18 MED ORDER — BENZOCAINE-MENTHOL 20-0.5 % EX AERO
1.0000 "application " | INHALATION_SPRAY | CUTANEOUS | Status: DC | PRN
  Filled 2017-08-18: qty 56

## 2017-08-18 MED ORDER — SIMETHICONE 80 MG PO CHEW: 80.0000 mg | CHEWABLE_TABLET | ORAL | Status: DC | PRN

## 2017-08-18 MED ORDER — DIBUCAINE 1 % RE OINT
1.0000 | TOPICAL_OINTMENT | RECTAL | Status: DC | PRN
Start: 2017-08-18 — End: 2017-08-19

## 2017-08-18 MED ORDER — COCONUT OIL OIL
1.0000 "application " | TOPICAL_OIL | Status: DC | PRN
  Administered 2017-08-18: 1 via TOPICAL
  Filled 2017-08-18: qty 120

## 2017-08-18 NOTE — Accreditation Note (Signed)
Interpreter Raquel used for initial education. Jtwells, rn

## 2017-08-18 NOTE — Progress Notes (Signed)
Interpreter Racquel used for this shift assessment and for PKU, TCB, and heartscreen tests for baby.  Jtwells, rn

## 2017-08-18 NOTE — Progress Notes (Signed)
POSTPARTUM PROGRESS NOTE  Post Partum Day 1  Subjective:  Bonnie Morales is a 36 y.o. [id number] s/p VBAV at [redacted]w[redacted]d.  No acute events overnight.  Pt denies problems with ambulating, voiding or po intake.  She denies nausea or vomiting.  Pain is well controlled.   She reports that prior to pregnancy she was on captopril for BP control.  Objective: Blood pressure 123/64, pulse (!) 57, temperature 97.9 F (36.6 C), temperature source Oral, resp. rate 20, height 5\' 5"  (1.651 m), weight 214 lb (97.1 kg), last menstrual period 11/27/2016, SpO2 100 %, unknown if currently breastfeeding.  Physical Exam:  General: alert, cooperative and no distress Chest: no respiratory distress Heart:regular rate, distal pulses intact Abdomen: soft, nontender,  Uterine Fundus: firm, appropriately tender DVT Evaluation: No calf swelling or tenderness Extremities: no edema Skin: warm, dry   Recent Labs  08/17/17 2256 08/18/17 0529  HGB 12.3 10.8*  HCT 35.5* 30.3*    Assessment/Plan: Bonnie Morales is a 36 y.o. [id number] s/p VBAC at [redacted]w[redacted]d   PPD#1 - Doing well Contraception: Nexplanon Feeding: breast Dispo: Plan for discharge tomorrow.   LOS: 1 day   Jenne Pane DegeleMD 08/18/2017, 8:02 PM

## 2017-08-18 NOTE — Anesthesia Postprocedure Evaluation (Signed)
Anesthesia Post Note  Patient: Undra Trembath  Procedure(s) Performed: AN AD [place]     Patient location during evaluation: Mother Baby Anesthesia Type: Epidural Level of consciousness: awake and alert and oriented Pain management: satisfactory to patient Vital Signs Assessment: post-procedure vital signs reviewed and stable Respiratory status: respiratory function stable Cardiovascular status: stable Postop Assessment: no headache, no backache, epidural receding, patient able to bend at knees, no signs of nausea or vomiting and adequate PO intake Anesthetic complications: no    Last Vitals:  Vitals:   08/18/17 0030 08/18/17 0430  BP: 133/62 (!) 96/56  Pulse: 68 64  Resp: 16 18  Temp: 36.9 C 36.6 C  SpO2: 100% 100%    Last Pain:  Vitals:   08/18/17 0430  TempSrc: Oral  PainSc: 2    Pain Goal:                 Dandria Griego

## 2017-08-19 MED ORDER — IBUPROFEN 600 MG PO TABS: 600.0000 mg | ORAL_TABLET | Freq: Four times a day (QID) | ORAL | 0 refills | Status: DC

## 2017-08-19 NOTE — Discharge Instructions (Signed)
Parto vaginal, cuidados de puerperio (Postpartum Care After Vaginal Delivery) El perodo de tiempo que sigue inmediatamente al parto se conoce como puerperio. [place]?  Podra continuar recibiendo medicamentos y lquidos travs de una va intravenosa (IV) que se Glass blower/designer en una de sus venas.  Si se le realiz una incisin cerca de la vagina (episiotoma) o si ha tenido Theme park manager parto, podran indicarle que se coloque compresas fras sobre la episiotoma o Psychiatrist. Esto ayuda a Best boy y la hinchazn.  Es posible que le den una botella rociadora para que use cuando vaya al bao. Puede utilizarla hasta que se sienta cmoda limpindose de la manera habitual. Siga los pasos a continuacin para usar la botella rociadora: ? Antes de orinar, llene la botella rociadora con agua tibia. No use agua caliente. ? Despus de Garment/textile technologist, [place] an est sentada en el inodoro, use la botella rociadora para enjuagar el rea alrededor de la uretra y la abertura vaginal. Con esto podr limpiar cualquier rastro de orina y [place]. ? Puede hacer esto en lugar de secarse. Cuando comience a Scientist, research (medical), podr usar la botella rociadora antes de secarse. Asegrese de secarse suavemente. ? Llene la botella rociadora con agua limpia cada vez que vaya al bao.  Deber usar apsitos sanitarios. CMO PUEDO SENTIRME?  Quizs no tenga necesidad de orinar durante varias horas despus del parto.  Sentir algo de dolor y Scientist, research (life sciences) en el abdomen y la vagina.  Si est amamantando, podra tener contracciones uterinas cada vez que lo haga. Estas podran prolongarse hasta varias semanas durante el puerperio. Las contracciones uterinas ayudan al tero a Stage manager a su tamao habitual.  Es normal tener un poco de hemorragia vaginal (loquios) despus del [place]. La cantidad y apariencia de los loquios a menudo es similar a las del perodo menstrual la primera semana despus del [place].  Disminuir gradualmente las siguientes semanas hasta convertirse en una descarga seca amarronada o [place]. En la [employer], los loquios se detienen [employer] 6 a 8semanas despus del [place]. Los sangrados vaginales pueden variar de mujer a Art therapist.  Los primeros das despus del parto, podra padecer [place]. Los pechos se sentirn pesados, llenos y molestos. Las mamas tambin podran latir y ponerse duras, muy tirantes, calientes y sensibles al tacto. Cuando esto [place], podra notar [employer] se escapa de los senos.El mdico puede recomendarle algunos mtodos para Gaffer causado por la [place]. La congestin mamaria debera desaparecer al cabo de [employer].  Podra sentirse ms deprimida o preocupada que lo habitual debido a los cambios hormonales luego del [place]. Estos sentimientos no deben durar ms de [employer]. Si no desaparecen al cabo de [employer], hable con su [place]?  Infrmele a su mdico si siente dolor o malestar.  Beba suficiente agua para mantener la orina clara o de color amarillo plido.  Lvese bien las manos con agua y jabn durante al menos 20segundos despus de cambiar el apsito sanitario, usar el bao o antes de sostener o Research scientist (life sciences) al beb.  Si no est amamantando, evite tocarse mucho los senos. Al hacerlo, podran producir ms [employer].  Si se siente dbil o mareada, o si siente que est a punto de desmayarse, pida ayuda antes de realizar lo siguiente: ? Levantarse de la cama. ? Ducharse.  Cambie los apsitos sanitarios con frecuencia. Observe si hay cambios en el flujo, como un aumento repentino en el  volumen, cambios en el color o cogulos sanguneos de gran tamao. Si expulsa un cogulo sanguneo por la vagina, gurdelo para mostrrselo a su mdico. No tire la cadena sin que el mdico examine el cogulo antes.  Asegrese de [place]. Esto la ayudar a  Personnel officer protegida y a proteger al beb de determinadas enfermedades. Podra necesitar vacunas antes de dejar el hospital.  Si lo desea, hable con el mdico acerca de los mtodos de planificacin familiar o control de la natalidad (mtodos anticonceptivos). CMO PUEDO ESTABLECER LAZOS CON MI BEB? Pasar tanto tiempo como le sea posible con el beb es sumamente importante. Durante ese tiempo, usted y su beb pueden conocerse y Technical brewer. Tener al beb con usted en la habitacin le dar tiempo de conocerlo. Esto tambin puede hacerla sentir ms cmoda para atender al beb. Amamantar tambin puede ayudarla a crear lazos con el beb. CMO PUEDO PLANIFICAR MI REGRESO A CASA CON EL BEB?  Asegrese de tener instalada una butaca en el automvil. ? La butaca debe contar con la certificacin del fabricante para asegurarse de que est instalada en forma segura. ? Asegrese de que el beb quede bien asegurado en la butaca.  Pregntele al mdico todo lo que necesite saber sobre los cuidados de su beb. Asegrese de poder comunicarse con el mdico en caso de que tenga preguntas luego de dejar el hospital. Esta informacin no tiene Marine scientist el consejo del mdico. Asegrese de hacerle al mdico cualquier pregunta que tenga. Document Released: 08/26/2007 Document Revised: 02/20/2016 Document Reviewed: 10/03/2015 Elsevier Interactive Patient Education  [employer].

## 2017-08-19 NOTE — Discharge Summary (Signed)
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 yo [phone] at [redacted]w[redacted]d was admitted to the hospital 08/17/2017 for induction of labor.  Indication for induction: cHTN.  Patient had an uncomplicated labor course as follows: Membrane Rupture Time/Date: 5:40 PM ,08/17/2017   Intrapartum Procedures: Episiotomy: None [1]                                         Lacerations:  Periurethral [8]  Patient had delivery of a Viable infant.  Information for the patient's newborn:  Bonnie Morales, Girl Channel [[id number]]  Delivery Method: VBAC, Spontaneous (Filed from Delivery Summary)   08/17/2017  Details of delivery can be found in separate delivery note.  Patient had a routine postpartum course. Patient is discharged home 08/19/17.  Physical exam  Vitals:   08/18/17 0430 08/18/17 1126 08/18/17 1900 08/19/17 0830  BP: (!) 96/56 (!) 105/47 123/64 122/63  Pulse: 64 (!) 58 (!) 57 69  Resp: 18 18 20 16   Temp: 97.8 F (36.6 C) 97.9 F (36.6 C) 97.9 F (36.6 C) 98.5 F (36.9 C)  TempSrc: Oral Oral Oral Oral  SpO2: 100%     Weight:      Height:       General: alert Lochia: appropriate Uterine Fundus: firm Incision: N/A DVT Evaluation: No evidence of DVT seen on physical exam. Labs: Lab Results  Component Value Date   WBC 8.6 08/18/2017   HGB 10.8 (L) 08/18/2017   HCT  30.3 (L) 08/18/2017   MCV 89.6 08/18/2017   PLT 186 08/18/2017   CMP Latest Ref Rng & Units 04/10/2017  Glucose 65 - 99 mg/dL 110(H)  BUN 6 - 20 mg/dL 6  Creatinine 0.44 - 1.00 mg/dL 0.48  Sodium 135 - 145 mmol/L 136  Potassium 3.5 - 5.1 mmol/L 3.7  Chloride 101 - 111 mmol/L 108  CO2 22 - 32 mmol/L 20(L)  Calcium 8.9 - 10.3 mg/dL 9.0  Total Protein 6.5 - 8.1 g/dL -  Total Bilirubin 0.3 - 1.2 mg/dL -  Alkaline Phos 38 - 126 U/L -  AST 15 - 41 U/L -  ALT 14 - 54 U/L -    Discharge instruction: per After Visit Summary and "Baby and Me Booklet".  After visit meds:  Allergies as of 08/19/2017  Reactions   Sulfa Antibiotics Hives   Reports high fever and red rash on arms      Medication List    STOP taking these medications   labetalol 100 MG tablet Commonly known as:  NORMODYNE   progesterone 200 MG capsule Commonly known as:  PROMETRIUM     TAKE these medications   aspirin 81 MG tablet Take 81 mg by mouth daily.   ibuprofen 600 MG tablet Commonly known as:  ADVIL,MOTRIN Take 1 tablet (600 mg total) by mouth every 6 (six) hours.   prenatal multivitamin Tabs tablet Take 1 tablet by mouth daily at 12 noon.       Diet: routine diet  Activity: Advance as tolerated. Pelvic rest for 6 weeks.   Outpatient follow up:6 weeks Follow up Appt:No future appointments. Follow up Visit:No Follow-up on file. Patient to have [facility] visit for Blood Pressure check at 1 week postpartum.   Postpartum contraception: Nexplanon  Newborn Data: Live born female  Birth Weight: 6 lb 6.8 oz (2914 g) APGAR: 9, 9  Newborn Delivery   Birth date/time:  08/17/2017 21:00:00 Delivery type:  VBAC, Spontaneous      Baby Feeding: Breast Disposition:home with mother   08/19/2017 Bonnie Morales, CNM

## 2017-08-19 NOTE — Progress Notes (Signed)
Voicemail left for [facility], Jan Fireman, RN regarding order for home visit for [facility] for [employer]

## 2017-08-19 NOTE — Lactation Note (Signed)
This note was copied from a baby's chart. Lactation Consultation Note  Patient Name: [name] SMOLM'B Date: 08/19/2017 Reason for consult: Initial assessment;Follow-up assessment  Initial visit at 19 hours of age.  Eda Royale in house spanish interpreter at bedside.  Mom reports feedings are going well and denies pain with latching. Baby sleeping in crib.   Mom is able to hand express drops of colostrum and apply to nipple.  Mom has normal breasts with everted intact nipples.  [facility] LC resources given and discussed.  Encouraged to feed with early cues on demand.  Early newborn behavior discussed.  Discussed milk transitioning to larger volume, engorgement care discussed.  Encouraged frequent feedings. Mom to soften breast as needed prior to latch.         Maternal Data Has patient been taught Hand Expression?: Yes Does the patient have breastfeeding experience prior to this delivery?: Yes  Feeding Feeding Type: Breast Fed Length of feed: 25 min  LATCH Score Latch: Repeated attempts needed to sustain latch, nipple held in mouth throughout feeding, stimulation needed to elicit sucking reflex.  Audible Swallowing: A few with stimulation  Type of Nipple: Everted at rest and after stimulation  Comfort (Breast/Nipple): Soft / non-tender  Hold (Positioning): No assistance needed to correctly position infant at breast.  LATCH Score: 8  Interventions Interventions: Breast feeding basics reviewed  Lactation Tools Discussed/Used WIC Program: Yes   Consult Status Consult Status: Complete    Malena Edman 08/19/2017, 11:24 AM

## 2017-08-23 ENCOUNTER — Encounter (HOSPITAL_COMMUNITY): Payer: Self-pay

## 2017-08-23 ENCOUNTER — Inpatient Hospital Stay (HOSPITAL_COMMUNITY)
Admission: AD | Admit: 2017-08-23 | Discharge: 2017-08-23 | Disposition: A | Discharge status: Home / Self Care | Payer: Self-pay | Source: Ambulatory Visit | Attending: Obstetrics and Gynecology | Admitting: Obstetrics and Gynecology

## 2017-08-23 DIAGNOSIS — Z9889 Other specified postprocedural states: Secondary | ICD-10-CM | POA: Insufficient documentation

## 2017-08-23 DIAGNOSIS — O1003 Pre-existing essential hypertension complicating the puerperium: Secondary | ICD-10-CM

## 2017-08-23 DIAGNOSIS — Z833 Family history of diabetes mellitus: Secondary | ICD-10-CM | POA: Insufficient documentation

## 2017-08-23 DIAGNOSIS — R51 Headache: Secondary | ICD-10-CM | POA: Insufficient documentation

## 2017-08-23 DIAGNOSIS — Z79899 Other long term (current) drug therapy: Secondary | ICD-10-CM | POA: Insufficient documentation

## 2017-08-23 DIAGNOSIS — Z7982 Long term (current) use of aspirin: Secondary | ICD-10-CM | POA: Insufficient documentation

## 2017-08-23 DIAGNOSIS — Z87442 Personal history of urinary calculi: Secondary | ICD-10-CM | POA: Insufficient documentation

## 2017-08-23 DIAGNOSIS — Z8249 Family history of ischemic heart disease and other diseases of the circulatory system: Secondary | ICD-10-CM | POA: Insufficient documentation

## 2017-08-23 DIAGNOSIS — H538 Other visual disturbances: Secondary | ICD-10-CM | POA: Insufficient documentation

## 2017-08-23 DIAGNOSIS — Z791 Long term (current) use of non-steroidal anti-inflammatories (NSAID): Secondary | ICD-10-CM | POA: Insufficient documentation

## 2017-08-23 DIAGNOSIS — O9089 Other complications of the puerperium, not elsewhere classified: Secondary | ICD-10-CM | POA: Insufficient documentation

## 2017-08-23 DIAGNOSIS — Z882 Allergy status to sulfonamides status: Secondary | ICD-10-CM | POA: Insufficient documentation

## 2017-08-23 DIAGNOSIS — Z87891 Personal history of nicotine dependence: Secondary | ICD-10-CM | POA: Insufficient documentation

## 2017-08-23 DIAGNOSIS — I10 Essential (primary) hypertension: Secondary | ICD-10-CM | POA: Insufficient documentation

## 2017-08-23 HISTORY — DX: Essential (primary) hypertension: I10

## 2017-08-23 LAB — COMPREHENSIVE METABOLIC PANEL
ALBUMIN: 3 g/dL — AB (ref 3.5–5.0)
ALT: 23 U/L (ref 14–54)
ANION GAP: 9 (ref 5–15)
AST: 27 U/L (ref 15–41)
Alkaline Phosphatase: 92 U/L (ref 38–126)
BILIRUBIN TOTAL: 0.3 mg/dL (ref 0.3–1.2)
BUN: 12 mg/dL (ref 6–20)
CHLORIDE: 107 mmol/L (ref 101–111)
CO2: 24 mmol/L (ref 22–32)
Calcium: 9 mg/dL (ref 8.9–10.3)
Creatinine, Ser: 0.48 mg/dL (ref 0.44–1.00)
GFR calc Af Amer: >60 mL/min (ref >60)
GFR calc non Af Amer: >60 mL/min (ref >60)
GLUCOSE: 99 mg/dL (ref 65–99)
POTASSIUM: 4.4 mmol/L (ref 3.5–5.1)
SODIUM: 140 mmol/L (ref 135–145)
TOTAL PROTEIN: 6.8 g/dL (ref 6.5–8.1)

## 2017-08-23 LAB — CBC
HEMATOCRIT: 34.2 % — AB (ref 36.0–46.0)
Hemoglobin: 11.7 g/dL — ABNORMAL LOW (ref 12.0–15.0)
MCH: 31 pg (ref 26.0–34.0)
MCHC: 34.2 g/dL (ref 30.0–36.0)
MCV: 90.5 fL (ref 78.0–100.0)
PLATELETS: 295 10*3/uL (ref 150–400)
RBC: 3.78 MIL/uL — ABNORMAL LOW (ref 3.87–5.11)
RDW: 14.3 % (ref 11.5–15.5)
WBC: 5.4 10*3/uL (ref 4.0–10.5)

## 2017-08-23 LAB — PROTEIN / CREATININE RATIO, URINE
Creatinine, Urine: 160 mg/dL
PROTEIN CREATININE RATIO: 0.13 mg/mg{creat} (ref 0.00–0.15)
Total Protein, Urine: 21 mg/dL

## 2017-08-23 MED ORDER — AMLODIPINE BESYLATE 5 MG PO TABS: 5.0000 mg | ORAL_TABLET | Freq: Every day | ORAL | 0 refills | Status: DC

## 2017-08-23 MED ORDER — ACETAMINOPHEN 500 MG PO TABS
1000.0000 mg | ORAL_TABLET | Freq: Once | ORAL | Status: AC
  Administered 2017-08-23: 1000 mg via ORAL
  Filled 2017-08-23: qty 2

## 2017-08-23 NOTE — Progress Notes (Signed)
Patient has chronic hypertension which is not in her chart was on blood pressure prior to pregnancy.

## 2017-08-23 NOTE — Discharge Instructions (Signed)
Hipertensin posparto (Postpartum Hypertension) La hipertensin posparto es cuando la presin arterial se mantiene ms alta que lo normal durante ms de [employer] del [place]. Puede no darse cuenta de que tiene hipertensin posparto si no le miden la presin arterial con regularidad. En algunos casos, la hipertensin posparto desaparece sola, por lo general en la semana posterior al parto. Sin embargo, algunas mujeres requieren tratamiento mdico para prevenir complicaciones graves, como convulsiones o un ictus. Entre las cosas que pueden influir en la presin arterial, se incluye lo siguiente:  El tipo de [place].  La administracin de lquidos u otros medicamentos por va intravenosa durante o despus del parto. CAUSAS La hipertensin posparto puede ser consecuencia de cualquiera de los siguientes factores o de la combinacin de cualquiera de ellos:  Hipertensin que exista antes del embarazo (hipertensin crnica).  Hipertensin gestacional.  Preeclampsia o eclampsia.  Administracin de mucho lquido a travs de una va intravenosa durante o despus del parto.  Medicamentos.  Sndrome de HELLP  Hipertiroidismo.  Ictus.  Otros trastornos neurolgicos o sanguneos poco frecuentes. En algunos casos, es posible que la causa no se conozca. [place] hipertensin posparto puede relacionarse con uno o ms factores de riesgo, por ejemplo:  Hipertensin crnica. En algunos casos, es posible que esta no se haya diagnosticado antes del embarazo.  Obesidad.  Diabetes tipo 2.  Enfermedad renal.  Antecedentes familiares de preeclampsia.  Otras enfermedades que causan desequilibrios hormonales. SIGNOS Y SNTOMAS Al igual que con todos los tipos de hipertensin, la hipertensin posparto puede no causar ningn sntoma. Segn lo alta que est la presin arterial, puede presentar lo siguiente:  Dolores de [place]. Estos pueden ser leves, moderados o intensos. Tambin  pueden ser regulares, constantes o de inicio repentino (cefalea en estallido).  Cambios en la visin.  Mareos.  Falta de aire.  Hinchazn de [employer], los pies, la parte inferior de las piernas o la cara. En algunos casos, puede tener hinchazn en varias de estas reas.  Palpitaciones o latidos cardacos acelerados.  Dificultad para respirar al [place].  Disminucin de la cantidad [place]. Otros signos y sntomas poco frecuentes pueden incluir lo siguiente:  Ms sudoracin que la habitual. Esta dura ms que [employer] del parto.  Dolor en el pecho.  Mareos repentinos al levantarse despus de haber estado sentada o [place].  Convulsiones.  Nuseas o vmitos.  Dolor abdominal. DIAGNSTICO El diagnstico de hipertensin posparto se establece mediante la combinacin de los [place] de los exmenes fsicos y los anlisis de sangre y [place]. Tambin pueden realizarle [employer], como una tomografa computarizada o una resonancia magntica, para Hydrographic surveyor otras complicaciones de la hipertensin posparto. TRATAMIENTO Cuando la presin arterial est lo suficientemente alta como para requerir tratamiento, las opciones incluyen lo siguiente:  Medicamentos para disminuir la presin arterial (antihipertensivos). Dgale al mdico si est amamantando o si planifica hacerlo. Hay muchos medicamentos antihipertensivos que pueden tomarse sin riesgos durante la Transport planner.  Interrupcin de los [employer] puedan causar la hipertensin.  Tratamiento de las enfermedades que causan la hipertensin.  Tratamiento de las complicaciones de la hipertensin, como convulsiones, ictus o problemas renales. El mdico seguir supervisando atentamente y de forma repetida su presin arterial hasta que esta se encuentre en un nivel seguro para usted. [place] los medicamentos solamente como se lo haya indicado el mdico.  Haga actividad  fsica con regularidad despus de que el mdico le diga que es  seguro hacerlo.  Siga las recomendaciones de su mdico sobre los lmites en el consumo de sal y lquido.  No consuma ningn producto que contenga tabaco, lo que incluye cigarrillos, tabaco de Higher education careers adviser o Psychologist, sport and exercise. Si necesita ayuda para dejar de fumar, consulte al mdico.  Concurra a todas las visitas de control como se lo haya indicado el mdico. Esto es importante. SOLICITE ATENCIN MDICA SI:  Los sntomas empeoran.  Aparecen nuevos sntomas, por ejemplo: ? Dolor de [place]. ? Mareos. ? Cambios en la visin. SOLICITE ATENCIN MDICA DE INMEDIATO SI:  Tiene un dolor de cabeza intenso o repentino.  Tiene convulsiones.  Siente debilidad o adormecimiento en un lado del cuerpo.  Tiene dificultades para pensar, hablar o tragar.  Sufre un dolor abdominal intenso.  Tiene dificultad para respirar, dolor en el pecho, latidos cardacos acelerados o palpitaciones. Estos sntomas pueden representar un problema grave que constituye Engineer, maintenance (IT). No espere hasta que los sntomas desaparezcan. Solicite atencin mdica de inmediato. Comunquese con el servicio de emergencias de su localidad (911 en los [place]). No conduzca por sus propios medios Principal Financial. Esta informacin no tiene Marine scientist el consejo del mdico. Asegrese de hacerle al mdico cualquier pregunta que tenga. Document Released: 08/13/2014 Document Revised: 08/13/2014 Document Reviewed: 05/13/2014 Elsevier Interactive Patient Education  2018 [employer].

## 2017-08-23 NOTE — MAU Note (Signed)
Vaginal delivery on 10/6.  Has BP cuff at home, was on meds during preg.  Was taken off meds, noted it going up last night.  158/82 last night 147/80 this morning. +HA, blurring in rt eye,  Denies epigastric pain, some swelling in feet.

## 2017-08-23 NOTE — MAU Provider Note (Signed)
History     CSN: [id number]  Arrival date and time: 08/23/17 1338   First Provider Initiated Contact with Patient 08/23/17 1439      Chief Complaint  Patient presents with  . Hypertension   HPI  Bonnie Morales is a 36 y.o. [phone] who presents with headache & hypertension. Patient is 1 week s/p SVD. PMH significant for CHTN diagnosed 4 years ago; normally takes captopril (from [place]) outside of pregnancy & was on labetalol during this pregnancy. Was discharged home with no medication. States yesterday she felt bad & had a headache so checked BP. Has had elevated BPs since yesterday; the highest was 160s/90s; also had 158/82 & 147/80. Reports parietal headache that is constant & aching. Rates pain 3/10. Took ibuprofen 200 mg this morning without relief. Reports occasional blurred vision in right eye. Denies CP, SOB, or epigastric pain.   Spanish interpreter at bedside.   Past Medical History:  Diagnosis Date  . Chronic hypertension   . History of kidney stones    passed stone  . Hypertension    2014  . Pre-eclampsia     Past Surgical History:  Procedure Laterality Date  . CERVICAL CERCLAGE N/A 04/12/2017   Procedure: CERCLAGE CERVICAL;  Surgeon: Chancy Milroy, MD;  Location: [facility];  Service: Gynecology;  Laterality: N/A;  . CESAREAN SECTION     epidural - in [place]    Family History  Problem Relation Age of Onset  . Diabetes Mother   . Hypertension Mother   . Heart disease Mother        tachycardia  . Hypertension Father     Social History  Substance Use Topics  . Smoking status: Former Smoker    Packs/day: 0.25    Years: 5.00    Types: Cigarettes    Quit date: 09/12/2016  . Smokeless tobacco: Never Used  . Alcohol use No    Allergies:  Allergies  Allergen Reactions  . Sulfa Antibiotics Hives    Reports high fever and red rash on arms    Prescriptions Prior to Admission  Medication Sig Dispense Refill Last Dose  . aspirin 81 MG tablet Take 81  mg by mouth daily.   Past Month at Unknown time  . ibuprofen (ADVIL,MOTRIN) 600 MG tablet Take 1 tablet (600 mg total) by mouth every 6 (six) hours. 30 tablet 0   . Prenatal Vit-Fe Fumarate-FA (PRENATAL MULTIVITAMIN) TABS tablet Take 1 tablet by mouth daily at 12 noon.   08/16/2017 at Unknown time    Review of Systems  Constitutional: Negative.   Eyes: Positive for visual disturbance. Negative for photophobia.  Respiratory: Negative for shortness of breath.   Cardiovascular: Negative for chest pain and leg swelling.  Gastrointestinal: Negative.   Genitourinary: Negative.   Neurological: Positive for headaches.   Physical Exam   Blood pressure 121/67, pulse 67, temperature 98.1 F (36.7 C), temperature source Oral, resp. rate 16, SpO2 99 %, currently breastfeeding.  Patient Vitals for the past 24 hrs:  BP Temp Temp src Pulse Resp SpO2  08/23/17 1530 137/69 - - (!) 50 - -  08/23/17 1515 (!) 144/80 - - (!) 53 - -  08/23/17 1500 131/66 - - (!) 55 - -  08/23/17 1445 (!) 152/83 - - 60 - -  08/23/17 1430 135/62 - - (!) 57 - -  08/23/17 1415 132/68 - - 65 - -  08/23/17 1409 121/67 - - 67 - -  08/23/17 1405 133/66 - - 65 - -  08/23/17 1354 (!) 152/86 98.1 F (36.7 C) Oral 63 16 99 %     Physical Exam  Nursing note and vitals reviewed. Constitutional: She is oriented to person, place, and time. She appears well-developed and well-nourished. No distress.  HENT:  Head: Normocephalic and atraumatic.  Eyes: Conjunctivae are normal. Right eye exhibits no discharge. Left eye exhibits no discharge. No scleral icterus.  Neck: Normal range of motion.  Cardiovascular: Normal rate, regular rhythm and normal heart sounds.   No murmur heard. Respiratory: Effort normal and breath sounds normal. No respiratory distress. She has no wheezes.  GI: Soft. Bowel sounds are normal. She exhibits no distension. There is no tenderness.  Neurological: She is alert and oriented to person, place, and time. She  has normal reflexes.  No clonus  Skin: Skin is warm and dry. She is not diaphoretic.  Psychiatric: She has a normal mood and affect. Her behavior is normal. Judgment and thought content normal.    MAU Course  Procedures Results for orders placed or performed during the hospital encounter of 08/23/17 (from the past 24 hour(s))  Protein / creatinine ratio, urine     Status: None   Collection Time: 08/23/17  1:57 PM  Result Value Ref Range   Creatinine, Urine 160.00 mg/dL   Total Protein, Urine 21 mg/dL   Protein Creatinine Ratio 0.13 0.00 - 0.15 mg/mg[Cre]  CBC     Status: Abnormal   Collection Time: 08/23/17  2:34 PM  Result Value Ref Range   WBC 5.4 4.0 - 10.5 K/uL   RBC 3.78 (L) 3.87 - 5.11 MIL/uL   Hemoglobin 11.7 (L) 12.0 - 15.0 g/dL   HCT 34.2 (L) 36.0 - 46.0 %   MCV 90.5 78.0 - 100.0 fL   MCH 31.0 26.0 - 34.0 pg   MCHC 34.2 30.0 - 36.0 g/dL   RDW 14.3 11.5 - 15.5 %   Platelets 295 150 - 400 K/uL  Comprehensive metabolic panel     Status: Abnormal   Collection Time: 08/23/17  2:34 PM  Result Value Ref Range   Sodium 140 135 - 145 mmol/L   Potassium 4.4 3.5 - 5.1 mmol/L   Chloride 107 101 - 111 mmol/L   CO2 24 22 - 32 mmol/L   Glucose, Bld 99 65 - 99 mg/dL   BUN 12 6 - 20 mg/dL   Creatinine, Ser 0.48 0.44 - 1.00 mg/dL   Calcium 9.0 8.9 - 10.3 mg/dL   Total Protein 6.8 6.5 - 8.1 g/dL   Albumin 3.0 (L) 3.5 - 5.0 g/dL   AST 27 15 - 41 U/L   ALT 23 14 - 54 U/L   Alkaline Phosphatase 92 38 - 126 U/L   Total Bilirubin 0.3 0.3 - 1.2 mg/dL   GFR calc non Af Amer >60 >60 mL/min   GFR calc Af Amer >60 >60 mL/min   Anion gap 9 5 - 15    MDM Cycle BPs, none severe range CBC, CMP, urine PCR Tylenol 1 gm PO -- pt reports resolution of symptoms C/w Dr. Rosana Hoes. Will discharge home with norvasc & f/u BP check next week  Assessment and Plan  A: 1. Essential hypertension in postpartum patient    P: Discharge home Rx norvasc to start today RN BP check in clinic on  Monday Discussed reasons to return to [place] 08/23/2017, 2:46 PM

## 2017-08-26 ENCOUNTER — Ambulatory Visit: Payer: Self-pay | Admitting: General Practice

## 2017-08-26 VITALS — BP 125/75 | HR 61 | Ht 65.0 in | Wt 196.0 lb

## 2017-08-26 DIAGNOSIS — Z0130 Encounter for examination of blood pressure without abnormal findings: Secondary | ICD-10-CM

## 2017-08-26 NOTE — Progress Notes (Signed)
Used [employer] [id number] for visit today. Patient states she is feeling a little bit better since her MAU visit but still has some neck pain/headache, rates 3-4. Patient also reports some blurry vision in right eye. Patient has been taking her norvasc daily. Patient reports concerns that she feels fine in the morning but in the afternoon she feels worse and feels like her blood pressure goes up. Patient states she has always taken a blood pressure medication twice a day and the one she has now is once a day. Reviewed all with Dr Hulan Fray who finds favorable blood pressure today. States patient may return for PM blood pressure tomorrow if patient desires. Patient's current BP medication is a once a day medication. Reviewed with patient & proper way to check blood pressure at home. Patient states she will continue to monitor her blood pressure at home and will return for an afternoon appt if it is elevated. Discussed with patient making a postpartum appt when she leaves today. Patient verbalized understanding and had no questions

## 2017-09-24 ENCOUNTER — Ambulatory Visit (INDEPENDENT_AMBULATORY_CARE_PROVIDER_SITE_OTHER): Payer: Medicaid Other | Admitting: Medical

## 2017-09-24 ENCOUNTER — Encounter: Payer: Self-pay | Admitting: Medical

## 2017-09-24 DIAGNOSIS — Z3202 Encounter for pregnancy test, result negative: Secondary | ICD-10-CM

## 2017-09-24 DIAGNOSIS — Z3049 Encounter for surveillance of other contraceptives: Secondary | ICD-10-CM

## 2017-09-24 DIAGNOSIS — O10919 Unspecified pre-existing hypertension complicating pregnancy, unspecified trimester: Secondary | ICD-10-CM | POA: Diagnosis present

## 2017-09-24 DIAGNOSIS — Z789 Other specified health status: Secondary | ICD-10-CM

## 2017-09-24 DIAGNOSIS — Z8742 Personal history of other diseases of the female genital tract: Secondary | ICD-10-CM | POA: Insufficient documentation

## 2017-09-24 DIAGNOSIS — I10 Essential (primary) hypertension: Secondary | ICD-10-CM

## 2017-09-24 DIAGNOSIS — Z1389 Encounter for screening for other disorder: Secondary | ICD-10-CM

## 2017-09-24 DIAGNOSIS — Z30017 Encounter for initial prescription of implantable subdermal contraceptive: Secondary | ICD-10-CM

## 2017-09-24 DIAGNOSIS — Z603 Acculturation difficulty: Secondary | ICD-10-CM

## 2017-09-24 DIAGNOSIS — Z8759 Personal history of other complications of pregnancy, childbirth and the puerperium: Secondary | ICD-10-CM

## 2017-09-24 HISTORY — DX: Personal history of other diseases of the female genital tract: Z87.42

## 2017-09-24 LAB — POCT PREGNANCY, URINE: Preg Test, Ur: NEGATIVE

## 2017-09-24 MED ORDER — ETONOGESTREL 68 MG ~~LOC~~ IMPL
68.0000 mg | DRUG_IMPLANT | Freq: Once | SUBCUTANEOUS | Status: AC
  Administered 2017-09-24: 68 mg via SUBCUTANEOUS

## 2017-09-24 MED ORDER — AMLODIPINE BESYLATE 5 MG PO TABS: 5.0000 mg | ORAL_TABLET | Freq: Every day | ORAL | 0 refills | Status: DC

## 2017-09-24 NOTE — Progress Notes (Signed)
Subjective:   Taking ibuprofen for last 2 days for pain in head feels like " burst vein". States blood pressure at home around 130/82.   Bonnie Morales is a 36 y.o. female who presents for a postpartum visit. She is 5 weeks postpartum following a spontaneous vaginal delivery. I have fully reviewed the prenatal and intrapartum course. The delivery was at 85 gestational weeks. Outcome: spontaneous vaginal delivery. Anesthesia: epidural. Postpartum course has been complicated by [facility], poorly controlled. Seen in MAU and started on Norvasc. Baby's course has been normal. Baby is feeding by both breast and bottle - unsure. Bleeding moderate lochia. Bowel function is normal. Bladder function is normal. Patient is not sexually active. Contraception method is none. Postpartum depression screening: negative.  The following portions of the patient's history were reviewed and updated as appropriate: allergies, current medications, past family history, past medical history, past social history, past surgical history and problem list.  Review of Systems Pertinent items are noted in HPI.   Objective:    BP 126/80   Pulse 67   General:  alert and cooperative   Breasts:  not performed  Lungs: clear to auscultation bilaterally  Heart:  regular rate and rhythm, S1, S2 normal, no murmur, click, rub or gallop  Abdomen: soft, non-tender; bowel sounds normal; no masses,  no organomegaly   Vulva:  not evaluated  Vagina: not evaluated  Cervix:  not evaluated  Corpus: not examined  Adnexa:  not evaluated  Rectal Exam: Not performed.         GYNECOLOGY CLINIC PROCEDURE NOTE Nexplanon Insertion Procedure Patient was given informed consent, she signed consent form.  Patient does understand that irregular bleeding is a very common side effect of this medication. She was advised to have backup contraception for one week after placement. Pregnancy test in clinic today was negative.  Appropriate time out taken.   Patient's left arm was prepped and draped in the usual sterile fashion.. The ruler used to measure and mark insertion area.  Patient was prepped with alcohol swab and then injected with 3 ml of 1% lidocaine.  She was prepped with betadine, Nexplanon removed from packaging,  Device confirmed in needle, then inserted full length of needle and withdrawn per handbook instructions. Nexplanon was able to palpated in the patient's arm; patient palpated the insert herself. There was minimal blood loss.  Patient insertion site covered with guaze and a pressure bandage to reduce any bruising.  The patient tolerated the procedure well and was given post procedure instructions.    Assessment:     Normal postpartum exam. Pap smear not done at today's visit. Last Pap smear was normal 2016.  CHTN Birth control initiation  Plan:    1. Contraception: Nexplanon 2. Referral to [facility] for HTN. Refill of Norvasc sent to patient's pharmacy.  3. Follow up as needed for routine GYN care   [name], PA-C 09/24/2017 9:34 AM

## 2017-09-24 NOTE — Patient Instructions (Signed)
Etonogestrel implant Qu es este medicamento? El ETONOGESTREL es un dispositivo anticonceptivo (control de la natalidad). Se usa para evitar los embarazos. Se puede usar hasta por 3 aos. Este medicamento puede ser utilizado para otros usos; si tiene alguna pregunta consulte con su proveedor de atencin mdica o con su farmacutico. MARCAS COMUNES: Implanon, Nexplanon Qu le debo informar a mi profesional de la salud antes de tomar este medicamento? Necesita saber si usted presenta alguno de los siguientes problemas o situaciones: sangrado vaginal anormal enfermedad vascular o cogulos sanguneos cncer de mama, cervical, heptico depresin diabetes enfermedad de la vescula biliar dolores de cabeza enfermedad cardiaca o ataque cardiaco reciente alta presin sangunea alto nivel de colesterol enfermedad renal enfermedad heptica convulsiones fuma tabaco una reaccin alrgica o inusual al etonogestrel, otras hormonas, anestsicos o antispticos, medicamentos, alimentos, colorantes o conservantes si est embarazada o buscando quedar embarazada si est amamantando a un beb Cmo debo utilizar este medicamento? Un profesional de la salud inserta este dispositivo justo debajo de la piel en la parte interior del brazo. Hable con su pediatra para informarse acerca del uso de este medicamento en nios. Puede requerir atencin especial. Sobredosis: Pngase en contacto inmediatamente con un centro toxicolgico o una sala de urgencia si usted cree que haya tomado demasiado medicamento. ATENCIN: Este medicamento es solo para usted. No comparta este medicamento con nadie. Qu sucede si me olvido de una dosis? No se aplica en este caso. Qu puede interactuar con este medicamento? No tome este medicamento con ninguno de los siguientes frmacos: amprenavir bosentano fosamprenavir Este medicamento tambin podra interactuar con los siguientes medicamentos: barbitricos para inducir el sueo o para el  tratamiento de convulsiones ciertos medicamentos para infecciones micticas, tales como itraconazol y ketoconazol jugo de toronja griseofulvina medicamentos para tratar convulsiones, tales como carbamazepina, felbamato, oxcarbazepina, fenitona, topiramato modafinilo fenilbutazona rifampicina rufinamida algunos medicamentos para tratar la infeccin por el VIH, tales como atazanavir, indinavir, lopinavir, nelfinavir, tipranavir, ritonavir hierba de [place] Puede ser que esta lista no menciona todas las posibles interacciones. Informe a su profesional de la salud de todos los productos a base de hierbas, medicamentos de venta libre o suplementos nutritivos que est tomando. Si usted fuma, consume bebidas alcohlicas o si utiliza drogas ilegales, indqueselo tambin a su profesional de la salud. Algunas sustancias pueden interactuar con su medicamento. A qu debo estar atento al usar este medicamento? Este producto no protege contra la infeccin por el VIH (SIDA) u otras enfermedades de transmisin sexual. Usted debe sentir el implante al presionar con las yemas de los dedos sobre la piel donde se insert. Contacte a su mdico si no se siente el implante y usa un mtodo anticonceptivo no hormonal (como el condn) hasta que el mdico confirma que el implante est en su lugar. Si siente que el implante puede haber roto o doblado en su brazo, pngase en contacto con su proveedor de atencin mdica. Qu efectos secundarios puedo tener al utilizar este medicamento? Efectos secundarios que debe informar a su mdico o a su profesional de la salud tan pronto como sea posible: reacciones alrgicas, como erupcin cutnea, picazn o urticarias, e hinchazn de la cara, los labios o la lengua bultos en las mamas cambios en las emociones o el estado de nimo estado de nimo deprimido sangrado menstrual intenso o prolongado dolor, irritacin, hinchazn o moretones en el lugar de la insercin cicatriz en el lugar de la  insercin signos de infeccin en el sitio de insercin tales como fiebre, y enrojecimiento de   la piel, dolor o secrecin signos de Media planner signos y sntomas de un cogulo sanguneo, tales como problemas respiratorios; cambios en la visin; dolor en el pecho; dolor de cabeza severo, repentino; dolor, hinchazn, calor en la pierna; dificultad para hablar; entumecimiento o debilidad repentina de la cara, el brazo o la pierna signos y sntomas de lesin al hgado, como orina amarilla oscura o [place]; sensacin general de estar enfermo o sntomas gripales; heces claras; prdida de apetito; nuseas; dolor en la regin abdominal superior derecha; cansancio o debilidad inusual; color amarillento de los ojos o la piel sangrado vaginal inusual, secrecin signos y sntomas de un derrame cerebral, tales como cambios en la visin; confusin; dificultad para hablar o entender; dolores de cabeza severos; entumecimiento o debilidad repentina de la cara, el brazo o la pierna; problemas al Writer; Chief of Staff; prdida del equilibrio o la coordinacin Efectos secundarios que generalmente no requieren Geophysical data processor (infrmelos a su mdico o a Barrister's clerk de la salud si persisten o si son molestos): acn dolor de Futures trader en las mamas cambios de peso mareos sensacin general de estar enfermo o sntomas gripales dolor de cabeza sangrado menstrual irregular nuseas dolor de garganta irritacin o inflamacin vaginal Puede ser que esta lista no menciona todos los posibles efectos secundarios. Comunquese a su mdico por asesoramiento mdico [employer] Usted puede informar los efectos secundarios a la FDA por telfono al 1-800-FDA-1088. Dnde debo guardar mi medicina? Este medicamento se administra en hospitales o clnicas y no necesitar guardarlo en su domicilio. ATENCIN: Este folleto es un resumen. Puede ser que no cubra toda la posible informacin. Si usted tiene preguntas acerca de esta medicina,  consulte con su mdico, su farmacutico o su profesional de Technical sales engineer.  2018 Elsevier/Gold Standard (2016-11-29 00:00:00) Nexplanon Instructions After Insertion   Keep bandage clean and dry for 24 hours   May use ice/Tylenol/Ibuprofen for soreness or pain   If you develop fever, drainage or increased warmth from incision site-contact office immediately

## 2018-01-02 IMAGING — US US MFM OB FOLLOW-UP
1 series · 13 of 28 positions shown · non-contrast
Comparison: none

[Series 1: us mfm ob follow-up · 94 acquisitions, 13 frames shown]
[im 4/94]
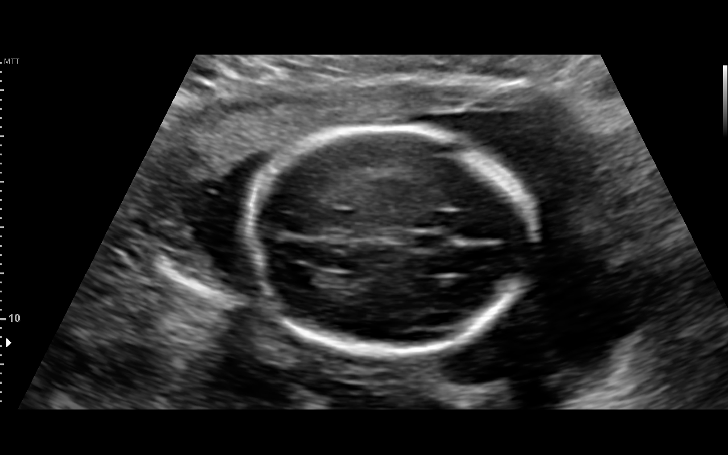
[im 11/94]
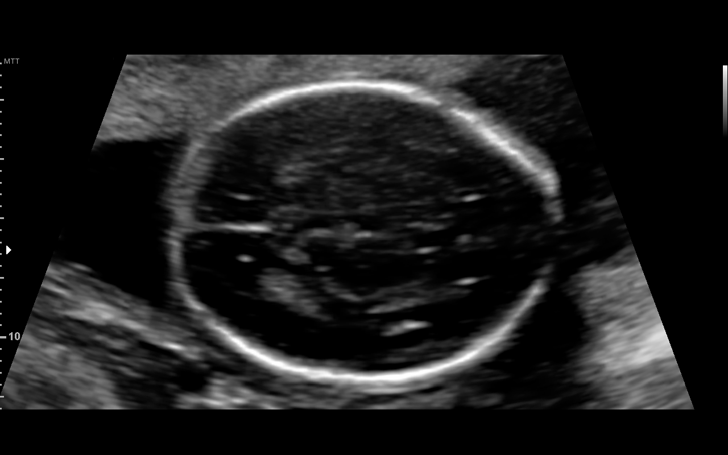
[im 18/94]
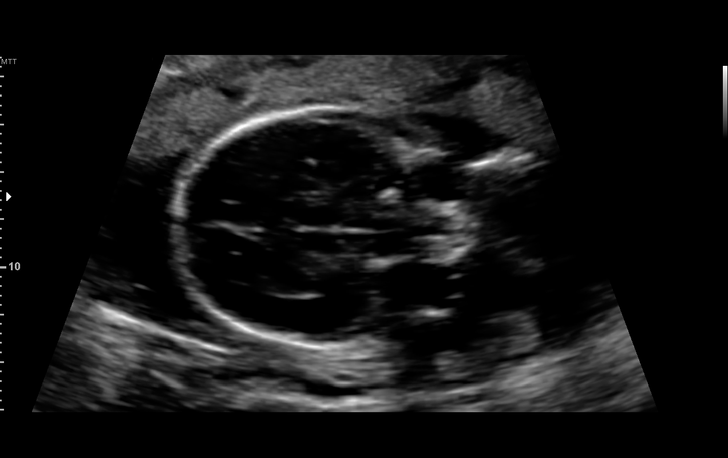
[im 25/94]
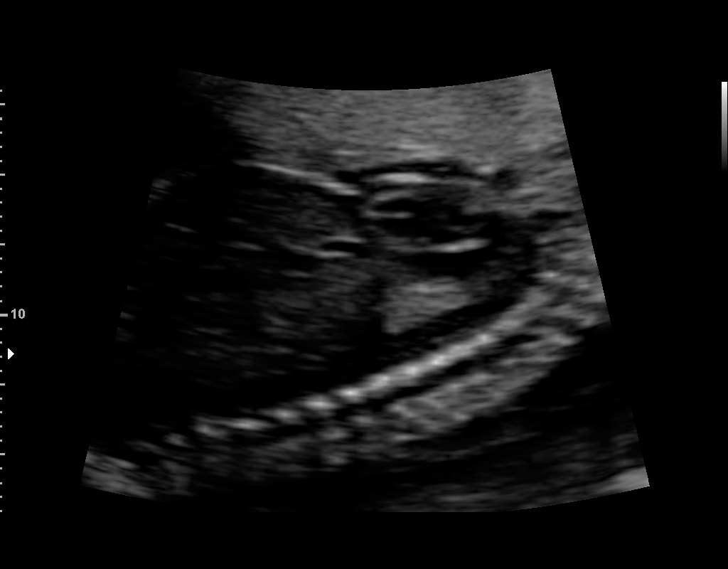
[im 32/94]
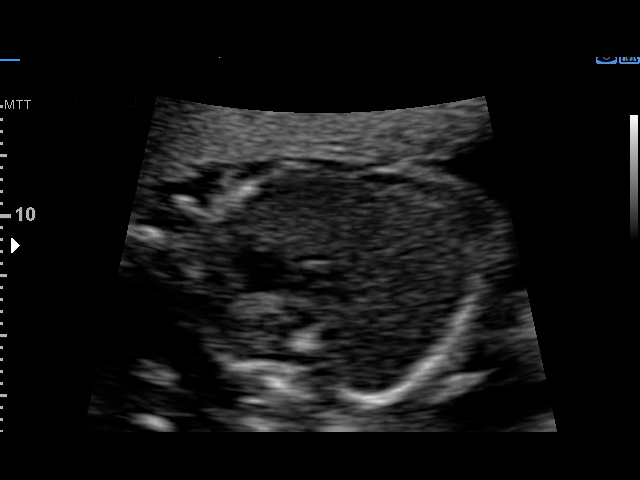
[im 38/94]
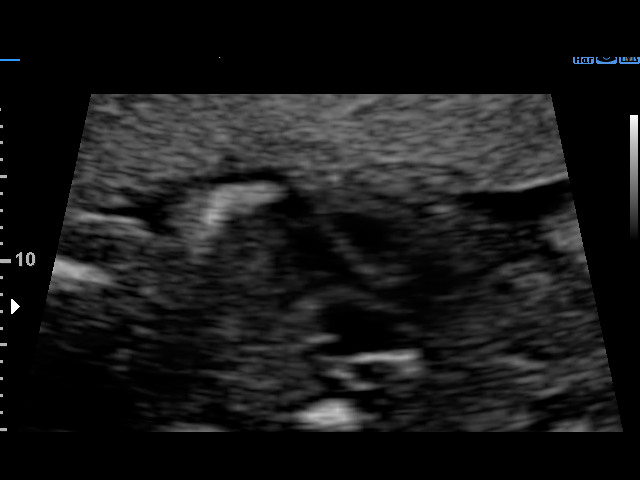
[im 49/94]
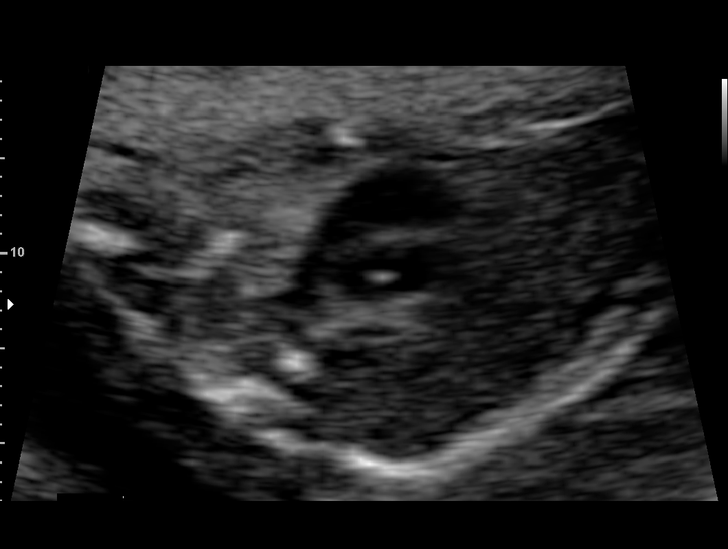
[im 56/94]
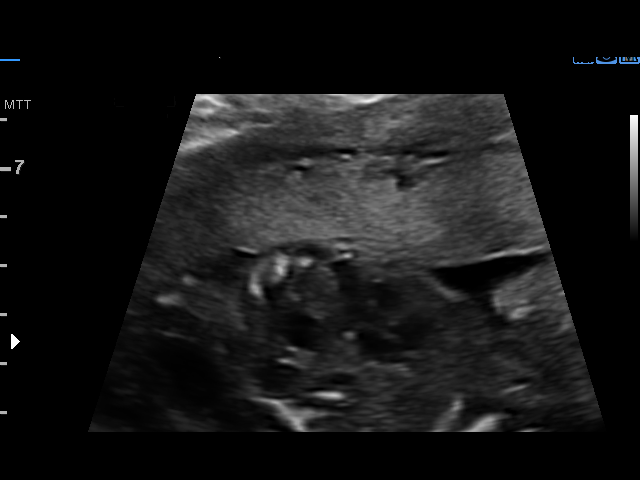
[im 63/94]
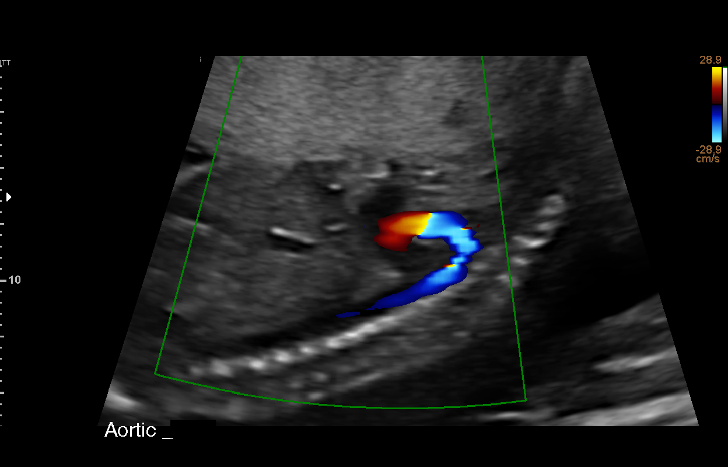
[im 69/94]
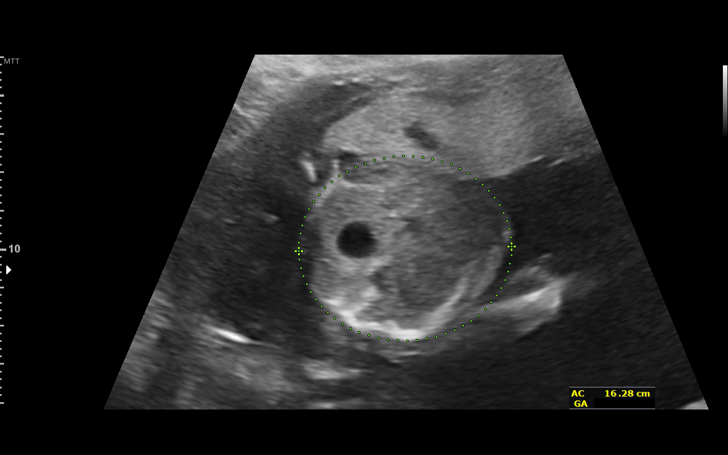
[im 76/94]
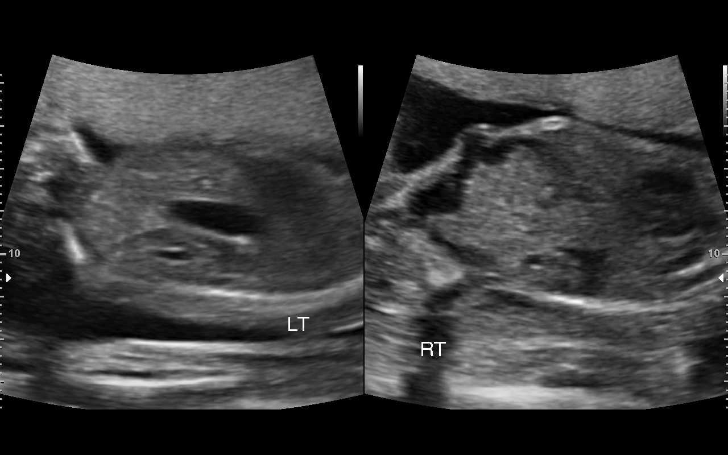
[im 83/94]
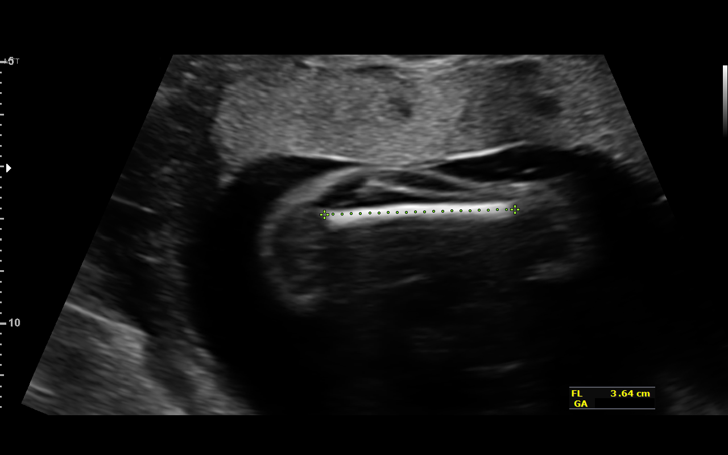
[im 90/94]
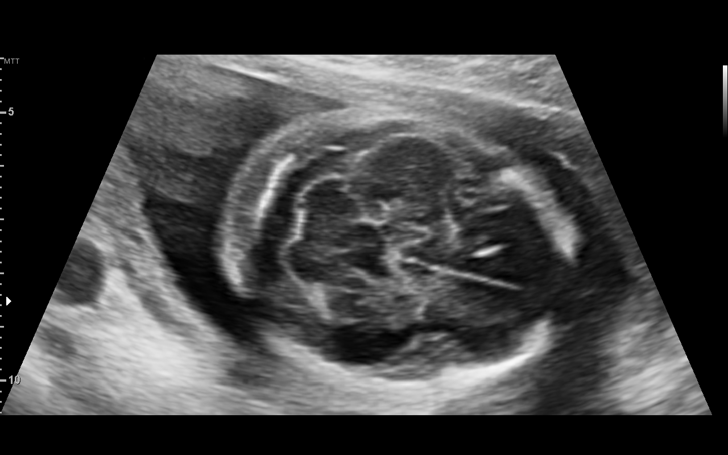

[13 of 28 positions shown; findings below may reference images not displayed]

MAU/Triage
OB/Gyn Clinic

1  ANIDI CLASS           [id number]      [id number]     [id number]
Indications

22 weeks gestation of pregnancy
Encounter for other antenatal screening
follow-up
Hypertension - Chronic/Pre-existing
(labetalol)
Poor obstetric history: Previous
preeclampsia / eclampsia/gestational HTN
Previous cesarean delivery, antepartum
Obesity complicating pregnancy(BMI 34)
Poor obstetric history: Previous preterm
delivery(69wks)
Cervical cerclage suture present, second
trimester (placed 04/12/17)
Abnormal biochemical screen (low risk
Panorama)
Vaginal bleeding in pregnancy, second
trimester
OB History

Blood Type:            Height:  5'5"   Weight (lb):  205      BMI:
Gravidity:    3         Term:   1        Prem:   1
Living:       2
Fetal Evaluation

Num Of Fetuses:     1
Fetal Heart         144
Rate(bpm):
Cardiac Activity:   Observed
Presentation:       Cephalic
Placenta:           Anterior, above cervical os
P. Cord Insertion:  Previously Visualized

Amniotic Fluid
AFI FV:      Subjectively within normal limits

Largest Pocket(cm)
5.8
Biometry

BPD:      50.2  mm     G. Age:  21w 1d         18  %    CI:        76.16   %   70 - 86
FL/HC:      19.9   %   18.4 -
HC:      182.3  mm     G. Age:  20w 4d        < 3  %    HC/AC:      1.08       1.06 -
AC:      168.5  mm     G. Age:  21w 6d         38  %    FL/BPD:     72.3   %   71 - 87
FL:       36.3  mm     G. Age:  21w 4d         24  %    FL/AC:      21.5   %   20 - 24
HUM:      33.8  mm     G. Age:  21w 4d         37  %
CER:      22.7  mm     G. Age:  21w 2d         31  %
CM:        5.4  mm

Est. FW:     434  gm    0 lb 15 oz      34  %
Gestational Age

LMP:           22w 0d       Date:   11/27/16                 EDD:   09/03/17
U/S Today:     21w 2d                                        EDD:   09/08/17
Best:          22w 0d    Det. By:   LMP  (11/27/16)          EDD:   09/03/17
Anatomy

Cranium:               Appears normal         Aortic Arch:            Appears normal
Cavum:                 Appears normal         Ductal Arch:            Appears normal
Ventricles:            Appears normal         Diaphragm:              Appears normal
Choroid Plexus:        Previously seen        Stomach:                Appears normal, left
sided
Cerebellum:            Appears normal         Abdomen:                Appears normal
Posterior Fossa:       Appears normal         Abdominal Wall:         Appears nml (cord
insert, abd wall)
Nuchal Fold:           Previously seen        Cord Vessels:           Previously seen
Face:                  Appears normal         Kidneys:                Appear normal
(orbits and profile)
Lips:                  Appears normal         Bladder:                Appears normal
Thoracic:              Appears normal         Spine:                  Previously seen
Heart:                 Appears normal         Upper Extremities:      Previously seen
(4CH, axis, and situs
RVOT:                  Appears normal         Lower Extremities:      Previously seen
LVOT:                  Appears normal

Other:  Female gender previously seen..Technically difficult due to maternal
habitus and fetal position.
Cervix Uterus Adnexa
Cervix
Length:              2  cm.    Funnel Length:      1.7  cm.
Funnel Width:      1.4  cm.
Cerclage vis. with Funneling of internal os noted earlier today on
previous tranvaginal us.

Uterus
No abnormality visualized.

Left Ovary
Not visualized. Imaged on previous ultrasound earlier today.

Right Ovary
Not visualized. Imaged on previous ultrasound earlier today

Cul De Sac:   No free fluid seen.

Adnexa:       No abnormality visualized.
Impression

Single IUP at 22w 0d
Normal interval anatomy
The estimated fetal weight is 434 g (34th %tile)
Anterior placenta without previa
Normal amniotic fluid volume
Recommendations

Follow-up ultrasounds as clinically indicated.

## 2018-03-18 ENCOUNTER — Encounter (HOSPITAL_COMMUNITY): Payer: Self-pay

## 2018-03-25 ENCOUNTER — Other Ambulatory Visit: Payer: Self-pay | Admitting: Radiology

## 2018-03-25 ENCOUNTER — Ambulatory Visit (HOSPITAL_COMMUNITY)
Admission: RE | Admit: 2018-03-25 | Discharge: 2018-03-25 | Disposition: A | Discharge status: Home / Self Care | Payer: Self-pay | Source: Ambulatory Visit | Attending: Obstetrics and Gynecology | Admitting: Obstetrics and Gynecology

## 2018-03-25 ENCOUNTER — Encounter (HOSPITAL_COMMUNITY): Payer: Self-pay

## 2018-03-25 VITALS — BP 122/76 | Ht 65.0 in | Wt 203.4 lb

## 2018-03-25 DIAGNOSIS — Z1239 Encounter for other screening for malignant neoplasm of breast: Secondary | ICD-10-CM

## 2018-03-25 NOTE — Patient Instructions (Signed)
Explained breast self awareness with Zuleyma Wynelle Fanny. Patient did not need a Pap smear today due to last Pap smear was in May or June 2018 per patient. Let her know [facility] will cover Pap smears every 3 years unless has a history of abnormal Pap smears. Referred patient to [facility] for a right breast biopsy per recommendation. Appointment scheduled for Tuesday, Mar 25, 2018 at 1300. Patient aware of appointment and will be there. Charles Mix verbalized understanding.  Belisa Eichholz, Arvil Chaco, RN 10:28 AM

## 2018-03-25 NOTE — Progress Notes (Signed)
Patient referred to [facility] by [facility] due to recommending a right breast biopsy. Diagnostic mammogram completed 03/18/2018. Complaints of bilateral outer breat pain x 2 weeks that radiates to the lower breast. Patient rates the pain at a 4 out of 10. Patient stated she stopped breastfeeding 1.5 months ago and still can express milk.  Pap Smear: Pap smear not completed today. Last Pap smear was in May or June 2018 at the [facility] and normal per patient. Per patient has no history of an abnormal Pap smear. Last Pap smear result is not in Epic but previous result 04/25/2015 is in [place].  Physical exam: Breasts Breasts symmetrical. No skin abnormalities bilateral breasts. No nipple retraction bilateral breasts. Patient stated she stopped breastfeeding 1.5 months ago. Per patient can still express breast milk. No lymphadenopathy. No lumps palpated bilateral breasts. No complaints of pain or tenderness on exam. Referred patient to [facility] for a right breast biopsy per recommendation. Appointment scheduled for Tuesday, Mar 25, 2018 at 1300.        Pelvic/Bimanual No Pap smear completed today since last Pap smear was in May or June 2018 per patient Pap smear not indicated per [facility] guidelines.   Smoking History: Patient is a former smoker that quit 2 years ago.  Patient Navigation: Patient education provided. Access to services provided for patient through [facility] program. Spanish interpreter provided.   Breast and Cervical Cancer Risk Assessment: Patient has no family history of breast cancer, known genetic mutations, or radiation treatment to the chest before age 44. Patient has no history of cervical dysplasia, immunocompromised, or DES exposure in-utero.  Used Spanish interpreter [employer] from [place].

## 2018-04-15 ENCOUNTER — Encounter (HOSPITAL_COMMUNITY): Payer: Self-pay | Admitting: *Deleted

## 2018-04-18 ENCOUNTER — Encounter (HOSPITAL_COMMUNITY): Payer: Self-pay | Admitting: *Deleted

## 2018-05-28 ENCOUNTER — Encounter (HOSPITAL_COMMUNITY): Payer: Self-pay

## 2018-07-16 ENCOUNTER — Encounter: Payer: Self-pay | Admitting: Obstetrics and Gynecology

## 2018-07-16 ENCOUNTER — Other Ambulatory Visit (HOSPITAL_COMMUNITY)
Admission: RE | Admit: 2018-07-16 | Discharge: 2018-07-16 | Disposition: A | Discharge status: Home / Self Care | Payer: Self-pay | Source: Ambulatory Visit | Attending: Obstetrics and Gynecology | Admitting: Obstetrics and Gynecology

## 2018-07-16 ENCOUNTER — Ambulatory Visit (INDEPENDENT_AMBULATORY_CARE_PROVIDER_SITE_OTHER): Payer: Self-pay | Admitting: Obstetrics and Gynecology

## 2018-07-16 DIAGNOSIS — R87810 Cervical high risk human papillomavirus (HPV) DNA test positive: Secondary | ICD-10-CM | POA: Insufficient documentation

## 2018-07-16 DIAGNOSIS — R87610 Atypical squamous cells of undetermined significance on cytologic smear of cervix (ASC-US): Secondary | ICD-10-CM

## 2018-07-16 DIAGNOSIS — R87612 Low grade squamous intraepithelial lesion on cytologic smear of cervix (LGSIL): Secondary | ICD-10-CM | POA: Insufficient documentation

## 2018-07-16 LAB — POCT PREGNANCY, URINE: Preg Test, Ur: NEGATIVE

## 2018-07-16 NOTE — Patient Instructions (Signed)
Colposcopa, Cuidado posterior (Colposcopy, Care After) La colposcopa es un procedimiento en el que se utiliza una herramienta especial para magnificar la superficie del cuello del tero. Tambin es posible que se tome una muestra de tejido (biopsia). Esta muestra se observar para identificar la presencia de cncer cervical u otros problemas. Despus del procedimiento:  Podr sentir algunos clicos.  Recustese algunos minutos si se siente mareada.  Podr tener un sangrado que debera detenerse luego de [place]. CUIDADOS EN EL HOGAR  No tenga relaciones sexuales ni use tampones durante 2 o 3 das o segn le hayan indicado.  Slo tome medicamentos como lo indique su mdico.  Contine tomando las pastillas anticonceptivas de la forma habitual. Averige los [place] de su anlisis Pregunte cundo estarn [employer] del examen. Asegrese de [employer]. SOLICITE AYUDA DE INMEDIATO SI:  Tiene un sangrado abundante o elimina cogulos.  Su temperatura es de 102 F (38.9 C) o mayor.  Margette Fast secrecin vaginal anormal.  Tiene clicos que no se [employer].  Siente mareos, vrtigo o pierde el conocimiento (se desmaya). ASEGRESE DE QUE:   Comprende estas instrucciones.  Controlar su enfermedad.  Solicitar ayuda de inmediato si no mejora o si empeora. Esta informacin no tiene Marine scientist el consejo del mdico. Asegrese de hacerle al mdico cualquier pregunta que tenga. Document Released: 12/01/2010 Document Revised: 01/21/2012 Elsevier Interactive Patient Education  2017 [employer].

## 2018-07-16 NOTE — Progress Notes (Signed)
    GYNECOLOGY CLINIC COLPOSCOPY PROCEDURE NOTE  37 y.o. [id number] here for colposcopy for ASCUS with POSITIVE high risk HPV pap smear on 7/19. Discussed role for HPV in cervical dysplasia, need for surveillance.  Patient given informed consent, signed copy in the chart, time out was performed.  Placed in lithotomy position. Cervix viewed with speculum and colposcope after application of acetic acid.   Colposcopy adequate? Yes  punctation and ACW changes noted at 12 o'clock; corresponding biopsies obtained.  ECC specimen obtained. All specimens were labelled and sent to pathology.   Patient was given post procedure instructions.  Will follow up pathology and manage accordingly.  Routine preventative health maintenance measures emphasized.   Arlina Robes, MD, [place] Attending [place] for [place]

## 2018-07-23 ENCOUNTER — Ambulatory Visit (INDEPENDENT_AMBULATORY_CARE_PROVIDER_SITE_OTHER): Payer: Self-pay | Admitting: Obstetrics and Gynecology

## 2018-07-23 ENCOUNTER — Encounter: Payer: Self-pay | Admitting: Obstetrics and Gynecology

## 2018-07-23 VITALS — BP 141/73 | HR 69 | Ht 65.0 in | Wt 201.7 lb

## 2018-07-23 DIAGNOSIS — R87810 Cervical high risk human papillomavirus (HPV) DNA test positive: Secondary | ICD-10-CM

## 2018-07-23 DIAGNOSIS — R87610 Atypical squamous cells of undetermined significance on cytologic smear of cervix (ASC-US): Secondary | ICD-10-CM

## 2018-07-23 NOTE — Progress Notes (Signed)
Ms Bonnie Morales is here to discuss her coplo results. 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  PE AF VSS Lungs clear Heart RRR   A/P LGSIL  Interpreter services used during today's visit. Recommendation of f/u pap with HPV co testing reviewed with pt. Pt very concerned about waiting 1 yr. Discussed cryotherapy of the cervix. Pt informed that [facility] does not cover and she would be responsibile for the cost, either up front of payment plan.  Pt verbalized understanding and is going to think about her options and contact the office with her choice.

## 2018-07-29 ENCOUNTER — Ambulatory Visit (HOSPITAL_COMMUNITY): Payer: Self-pay

## 2018-08-26 ENCOUNTER — Encounter: Payer: Self-pay | Admitting: *Deleted

## 2019-04-07 ENCOUNTER — Encounter: Payer: Self-pay | Admitting: *Deleted

## 2019-08-21 LAB — CYTOLOGY - PAP: Pap: NEGATIVE

## 2020-02-12 ENCOUNTER — Ambulatory Visit: Payer: Self-pay | Attending: Internal Medicine

## 2020-02-12 DIAGNOSIS — Z23 Encounter for immunization: Secondary | ICD-10-CM

## 2020-02-12 NOTE — Progress Notes (Signed)
   Covid-19 Vaccination Clinic  Name:  Bonnie Morales    MRN: [id number] DOB: 01-23-1981  02/12/2020  Bonnie Morales was observed post Covid-19 immunization for 15 minutes without incident. She was provided with Vaccine Information Sheet and instruction to access the V-Safe system.   Bonnie Morales was instructed to call 911 with any severe reactions post vaccine: Marland Kitchen Difficulty breathing  . Swelling of face and throat  . A fast heartbeat  . A bad rash all over body  . Dizziness and weakness   Immunizations Administered    Name Date Dose VIS Date Route   Pfizer COVID-19 Vaccine 02/12/2020  3:22 PM 0.3 mL 10/23/2019 Intramuscular   Manufacturer: [place]   Lot: [id number]   [place]: [id number]

## 2020-03-04 ENCOUNTER — Ambulatory Visit: Payer: Self-pay | Attending: Internal Medicine

## 2020-03-04 DIAGNOSIS — Z23 Encounter for immunization: Secondary | ICD-10-CM

## 2020-03-04 NOTE — Progress Notes (Signed)
   Covid-19 Vaccination Clinic  Name:  Kasondra Sandler    MRN: [id number] DOB: 1981/08/03  03/04/2020  Ms. Rabecca Klepinger was observed post Covid-19 immunization for 15 minutes without incident. She was provided with Vaccine Information Sheet and instruction to access the V-Safe system.   Ms. Madee Brodowski was instructed to call 911 with any severe reactions post vaccine: Marland Kitchen Difficulty breathing  . Swelling of face and throat  . A fast heartbeat  . A bad rash all over body  . Dizziness and weakness   Immunizations Administered    Name Date Dose VIS Date Route   Pfizer COVID-19 Vaccine 03/04/2020  3:53 PM 0.3 mL 01/06/2019 Intramuscular   Manufacturer: [employer], [employer]   Lot: [id number]   [place]: [id number]

## 2020-08-03 ENCOUNTER — Ambulatory Visit: Payer: Self-pay | Admitting: Obstetrics and Gynecology

## 2021-05-21 ENCOUNTER — Inpatient Hospital Stay (HOSPITAL_COMMUNITY): Payer: Self-pay

## 2021-05-21 ENCOUNTER — Encounter (HOSPITAL_COMMUNITY): Payer: Self-pay | Admitting: Family Medicine

## 2021-05-21 ENCOUNTER — Inpatient Hospital Stay (HOSPITAL_COMMUNITY)
Admission: AD | Admit: 2021-05-21 | Discharge: 2021-05-21 | Disposition: A | Discharge status: Home / Self Care | Payer: Self-pay | Attending: Family Medicine | Admitting: Family Medicine

## 2021-05-21 ENCOUNTER — Other Ambulatory Visit: Payer: Self-pay

## 2021-05-21 DIAGNOSIS — O09291 Supervision of pregnancy with other poor reproductive or obstetric history, first trimester: Secondary | ICD-10-CM | POA: Insufficient documentation

## 2021-05-21 DIAGNOSIS — O26891 Other specified pregnancy related conditions, first trimester: Secondary | ICD-10-CM | POA: Insufficient documentation

## 2021-05-21 DIAGNOSIS — Z3A01 Less than 8 weeks gestation of pregnancy: Secondary | ICD-10-CM | POA: Insufficient documentation

## 2021-05-21 DIAGNOSIS — Z87891 Personal history of nicotine dependence: Secondary | ICD-10-CM | POA: Insufficient documentation

## 2021-05-21 DIAGNOSIS — O26851 Spotting complicating pregnancy, first trimester: Secondary | ICD-10-CM | POA: Insufficient documentation

## 2021-05-21 DIAGNOSIS — O209 Hemorrhage in early pregnancy, unspecified: Secondary | ICD-10-CM

## 2021-05-21 DIAGNOSIS — R109 Unspecified abdominal pain: Secondary | ICD-10-CM | POA: Insufficient documentation

## 2021-05-21 DIAGNOSIS — O4691 Antepartum hemorrhage, unspecified, first trimester: Secondary | ICD-10-CM

## 2021-05-21 DIAGNOSIS — Z3491 Encounter for supervision of normal pregnancy, unspecified, first trimester: Secondary | ICD-10-CM

## 2021-05-21 DIAGNOSIS — O09521 Supervision of elderly multigravida, first trimester: Secondary | ICD-10-CM | POA: Insufficient documentation

## 2021-05-21 LAB — CBC
HCT: 37.9 % (ref 36.0–46.0)
Hemoglobin: 12.8 g/dL (ref 12.0–15.0)
MCH: 29.3 pg (ref 26.0–34.0)
MCHC: 33.8 g/dL (ref 30.0–36.0)
MCV: 86.7 fL (ref 80.0–100.0)
Platelets: 277 10*3/uL (ref 150–400)
RBC: 4.37 MIL/uL (ref 3.87–5.11)
RDW: 13.9 % (ref 11.5–15.5)
WBC: 7.3 10*3/uL (ref 4.0–10.5)
nRBC: 0 % (ref 0.0–0.2)

## 2021-05-21 LAB — URINALYSIS, ROUTINE W REFLEX MICROSCOPIC
Bilirubin Urine: NEGATIVE
Glucose, UA: NEGATIVE mg/dL
Ketones, ur: 5 mg/dL — AB
Leukocytes,Ua: NEGATIVE
Nitrite: NEGATIVE
Protein, ur: NEGATIVE mg/dL
Specific Gravity, Urine: 1.016 (ref 1.005–1.030)
pH: 7 (ref 5.0–8.0)

## 2021-05-21 LAB — HCG, QUANTITATIVE, PREGNANCY: hCG, Beta Chain, Quant, S: 15991 m[IU]/mL — ABNORMAL HIGH (ref <5)

## 2021-05-21 LAB — WET PREP, GENITAL
Clue Cells Wet Prep HPF POC: NONE SEEN
Sperm: NONE SEEN
Trich, Wet Prep: NONE SEEN
Yeast Wet Prep HPF POC: NONE SEEN

## 2021-05-21 LAB — POCT PREGNANCY, URINE: Preg Test, Ur: POSITIVE — AB

## 2021-05-21 NOTE — MAU Provider Note (Signed)
History     CSN: [id number]  Arrival date and time: 05/21/21 1205   Event Date/Time   First Provider Initiated Contact with Patient 05/21/21 1247      Chief Complaint  Patient presents with   Vaginal Bleeding   Abdominal Pain   HPI Bonnie Morales is a 40 y.o. [phone] at [redacted]w[redacted]d who presents with vaginal bleeding. She reports she had intercourse this am and saw bright red bleeding when she wiped. She denies any pain. She reports the bleeding is still light. She denies any itching or burning or issues with urination. She has not been seen anywhere for this pregnancy yet.   OB History     Gravida  4   Para  3   Term  2   Preterm  1   AB  0   Living  3      SAB  0   IAB  0   Ectopic  0   Multiple  0   Live Births  3           Past Medical History:  Diagnosis Date   Chronic hypertension    History of kidney stones    passed stone   Hypertension    2014   Pre-eclampsia     Past Surgical History:  Procedure Laterality Date   CERVICAL CERCLAGE N/A 04/12/2017   Procedure: CERCLAGE CERVICAL;  Surgeon: Chancy Milroy, MD;  Location: [facility];  Service: Gynecology;  Laterality: N/A;   CESAREAN SECTION     epidural - in [place]    Family History  Problem Relation Age of Onset   Diabetes Mother    Hypertension Mother    Heart disease Mother        tachycardia   Hypertension Father     Social History   Tobacco Use   Smoking status: Former    Packs/day: 0.25    Years: 5.00    Pack years: 1.25    Types: Cigarettes    Quit date: 09/12/2016    Years since quitting: 4.6   Smokeless tobacco: Never  Vaping Use   Vaping Use: Never used  Substance Use Topics   Alcohol use: No   Drug use: No    Allergies:  Allergies  Allergen Reactions   Sulfa Antibiotics Hives    Reports high fever and red rash on arms    Medications Prior to Admission  Medication Sig Dispense Refill Last Dose   amLODipine (NORVASC) 5 MG tablet Take 1 tablet (5 mg total)  daily by mouth. 30 tablet 0 05/20/2021   aspirin 81 MG tablet Take 81 mg by mouth daily.   05/20/2021   Prenatal Vit-Fe Fumarate-FA (PRENATAL MULTIVITAMIN) TABS tablet Take 1 tablet by mouth daily at 12 noon.   05/20/2021   ibuprofen (ADVIL,MOTRIN) 600 MG tablet Take 1 tablet (600 mg total) by mouth every 6 (six) hours. (Patient not taking: Reported on 03/25/2018) 30 tablet 0     Review of Systems  Constitutional: Negative.  Negative for fatigue and fever.  HENT: Negative.    Respiratory: Negative.  Negative for shortness of breath.   Cardiovascular: Negative.  Negative for chest pain.  Gastrointestinal: Negative.  Negative for abdominal pain, constipation, diarrhea, nausea and vomiting.  Genitourinary:  Positive for vaginal bleeding. Negative for dysuria and vaginal discharge.  Neurological: Negative.  Negative for dizziness and headaches.  Physical Exam   Blood pressure 133/61, pulse 64, temperature 98 F (36.7 C), temperature source Oral, resp.  rate 17, last menstrual period 04/10/2021, SpO2 100 %, currently breastfeeding.  Physical Exam Vitals and nursing note reviewed.  Constitutional:      General: She is not in acute distress.    Appearance: She is well-developed.  HENT:     Head: Normocephalic.  Eyes:     Pupils: Pupils are equal, round, and reactive to light.  Cardiovascular:     Rate and Rhythm: Normal rate and regular rhythm.     Heart sounds: Normal heart sounds.  Pulmonary:     Effort: Pulmonary effort is normal. No respiratory distress.     Breath sounds: Normal breath sounds.  Abdominal:     General: Bowel sounds are normal. There is no distension.     Palpations: Abdomen is soft.     Tenderness: There is no abdominal tenderness.  Skin:    General: Skin is warm and dry.  Neurological:     Mental Status: She is alert and oriented to person, place, and time.  Psychiatric:        Mood and Affect: Mood normal.        Behavior: Behavior normal.        Thought Content:  Thought content normal.        Judgment: Judgment normal.    MAU Course  Procedures Results for orders placed or performed during the hospital encounter of 05/21/21 (from the past 24 hour(s))  Pregnancy, urine POC     Status: Abnormal   Collection Time: 05/21/21 12:17 PM  Result Value Ref Range   Preg Test, Ur POSITIVE (A) NEGATIVE  Urinalysis, Routine w reflex microscopic     Status: Abnormal   Collection Time: 05/21/21 12:20 PM  Result Value Ref Range   Color, Urine YELLOW YELLOW   APPearance HAZY (A) CLEAR   Specific Gravity, Urine 1.016 1.005 - 1.030   pH 7.0 5.0 - 8.0   Glucose, UA NEGATIVE NEGATIVE mg/dL   Hgb urine dipstick LARGE (A) NEGATIVE   Bilirubin Urine NEGATIVE NEGATIVE   Ketones, ur 5 (A) NEGATIVE mg/dL   Protein, ur NEGATIVE NEGATIVE mg/dL   Nitrite NEGATIVE NEGATIVE   Leukocytes,Ua NEGATIVE NEGATIVE   RBC / HPF 6-10 0 - 5 RBC/hpf   WBC, UA 0-5 0 - 5 WBC/hpf   Bacteria, UA RARE (A) NONE SEEN   Squamous Epithelial / LPF 6-10 0 - 5   Mucus PRESENT   CBC     Status: None   Collection Time: 05/21/21 12:46 PM  Result Value Ref Range   WBC 7.3 4.0 - 10.5 K/uL   RBC 4.37 3.87 - 5.11 MIL/uL   Hemoglobin 12.8 12.0 - 15.0 g/dL   HCT 37.9 36.0 - 46.0 %   MCV 86.7 80.0 - 100.0 fL   MCH 29.3 26.0 - 34.0 pg   MCHC 33.8 30.0 - 36.0 g/dL   RDW 13.9 11.5 - 15.5 %   Platelets 277 150 - 400 K/uL   nRBC 0.0 0.0 - 0.2 %  hCG, quantitative, pregnancy     Status: Abnormal   Collection Time: 05/21/21 12:46 PM  Result Value Ref Range   hCG, Beta Chain, Quant, S 15,991 (H) <5 mIU/mL  Wet prep, genital     Status: Abnormal   Collection Time: 05/21/21 12:50 PM  Result Value Ref Range   Yeast Wet Prep HPF POC NONE SEEN NONE SEEN   Trich, Wet Prep NONE SEEN NONE SEEN   Clue Cells Wet Prep HPF POC NONE SEEN NONE SEEN  WBC, Wet Prep HPF POC MODERATE (A) NONE SEEN   Sperm NONE SEEN     US OB LESS THAN 14 WEEKS WITH OB TRANSVAGINAL  Result Date: 05/21/2021 CLINICAL DATA:   Vaginal bleeding. Five weeks and 6 days pregnant by last menstrual period. Quantitative beta HCG 15,991. EXAM: OBSTETRIC <14 WK [place] AND TRANSVAGINAL OB US TECHNIQUE: Both transabdominal and transvaginal ultrasound examinations were performed for complete evaluation of the gestation as well as the maternal uterus, adnexal regions, and pelvic cul-de-sac. Transvaginal technique was performed to assess early pregnancy. COMPARISON:  08/15/2017. FINDINGS: Intrauterine gestational sac: Visualized Yolk sac:  Visualized Embryo:  Visualized Cardiac Activity: Visualized Heart Rate: 99 bpm CRL:  2.9 mm   5 w   5 d                  [place] EDC: 01/16/2022 Subchorionic hemorrhage:  None visualized. Maternal uterus/adnexae: Normal appearing ovaries. No free peritoneal fluid. IMPRESSION: Single live intrauterine gestation with an estimated gestational age of [redacted] weeks and 5 days. No complicating features seen. Electronically Signed   By: Claudie Revering M.D.   On: 05/21/2021 16:40     MDM UA, UPT CBC, HCG ABO/Rh- O Pos Wet prep and gc/chlamydia US OB Comp Less 14 weeks with Transvaginal  Assessment and Plan   1. Normal intrauterine pregnancy on prenatal ultrasound in first trimester   2. Vaginal bleeding affecting early pregnancy   3. [redacted] weeks gestation of pregnancy    -Discharge home in stable condition -First trimester precautions discussed -Patient advised to follow-up with OB to establish prenatal care -Patient may return to MAU as needed or if her condition were to change or worsen  Wende Mott CNM 05/21/2021, 12:47 PM

## 2021-05-21 NOTE — MAU Note (Signed)
Pt reports to mau with c/o vag bleeding after having sex this morning.  Pt also reports some lower abd cramping

## 2021-05-21 NOTE — Discharge Instructions (Signed)

## 2021-05-22 LAB — GC/CHLAMYDIA PROBE AMP (~~LOC~~) NOT AT ARMC
Chlamydia: NEGATIVE
Comment: NEGATIVE
Comment: NORMAL
Neisseria Gonorrhea: NEGATIVE

## 2021-05-30 ENCOUNTER — Encounter: Payer: Self-pay | Admitting: *Deleted

## 2021-06-08 ENCOUNTER — Encounter: Payer: Self-pay | Admitting: General Practice

## 2021-06-14 ENCOUNTER — Encounter: Payer: Self-pay | Admitting: Obstetrics and Gynecology

## 2021-06-21 ENCOUNTER — Other Ambulatory Visit: Payer: Self-pay

## 2021-06-21 ENCOUNTER — Encounter: Payer: Self-pay | Admitting: *Deleted

## 2021-06-21 ENCOUNTER — Ambulatory Visit (INDEPENDENT_AMBULATORY_CARE_PROVIDER_SITE_OTHER): Payer: Self-pay | Admitting: Family Medicine

## 2021-06-21 ENCOUNTER — Encounter: Payer: Self-pay | Admitting: Family Medicine

## 2021-06-21 VITALS — BP 136/75 | HR 61 | Wt 208.0 lb

## 2021-06-21 DIAGNOSIS — Z8742 Personal history of other diseases of the female genital tract: Secondary | ICD-10-CM

## 2021-06-21 DIAGNOSIS — R87810 Cervical high risk human papillomavirus (HPV) DNA test positive: Secondary | ICD-10-CM

## 2021-06-21 DIAGNOSIS — O0990 Supervision of high risk pregnancy, unspecified, unspecified trimester: Secondary | ICD-10-CM | POA: Insufficient documentation

## 2021-06-21 DIAGNOSIS — Z8759 Personal history of other complications of pregnancy, childbirth and the puerperium: Secondary | ICD-10-CM

## 2021-06-21 DIAGNOSIS — Z98891 History of uterine scar from previous surgery: Secondary | ICD-10-CM

## 2021-06-21 DIAGNOSIS — I10 Essential (primary) hypertension: Secondary | ICD-10-CM

## 2021-06-21 DIAGNOSIS — R87610 Atypical squamous cells of undetermined significance on cytologic smear of cervix (ASC-US): Secondary | ICD-10-CM

## 2021-06-21 DIAGNOSIS — Z789 Other specified health status: Secondary | ICD-10-CM

## 2021-06-21 MED ORDER — PROMETHAZINE HCL 25 MG PO TABS: 25.0000 mg | ORAL_TABLET | Freq: Four times a day (QID) | ORAL | 2 refills | Status: DC | PRN

## 2021-06-21 MED ORDER — CYCLOBENZAPRINE HCL 10 MG PO TABS: 10.0000 mg | ORAL_TABLET | Freq: Three times a day (TID) | ORAL | 1 refills | Status: DC | PRN

## 2021-06-21 NOTE — Patient Instructions (Signed)
Segundo trimestre de [employer] Second Trimester of Pregnancy  El segundo trimestre de Media planner va desde la semana 13 hasta la semana 27. Es Software engineer desde el mes 4 hasta el mes 6 de [place]. El segundo trimestre suele ser el momento en el que mejor se siente. Su organismo se ha adaptado a Armed forces technical officer, y comienza a Print production planner. Durante el segundo trimestre: Las nuseas del embarazo han disminuido o han desaparecido completamente. Usted puede tener ms energa. Es posible que tenga un aumento del apetito. El segundo trimestre es tambin un perodo en el que el beb en gestacin (feto) crece rpidamente. Hacia el final del sexto mes, el feto puede medir aproximadamente 12 pulgadas y pesar alrededor de 1 libras. Es probable que sienta que el beb se Software engineer (da pataditas) entre las 40 y [place]. Cambios en el cuerpo durante el segundo trimestre Su cuerpo continua experimentando numerosos cambios durante su segundo trimestre. Los cambios varan y generalmente vuelven a la normalidad despusdel nacimiento del beb. Cambios fsicos Seguir [employer] Family Insurance. Notar que la parte baja del abdomen sobresale. Podrn aparecer las primeras Apache Corporation caderas, el abdomen y las [place]. Las Lincoln National Corporation seguirn creciendo y se tornarn sensibles. Pueden aparecer zonas oscuras o manchas (cloasma o mscara del embarazo) en el rostro. Es posible que se forme una lnea oscura desde el ombligo hasta la zona del pubis (linea nigra). Tal vez haya cambios en el cabello. Esto cambios pueden incluir su engrosamiento, crecimiento rpido y Harley[employer]Davidson textura. A algunas personas tambin se les cae el cabello durante o despus del [place], o tienen el cabello seco o fino. Cambios en la salud Comienza a tener dolores de [place]. Es posible que tenga acidez estomacal. Puede tener estreimiento. Pueden aparecer hemorroides o abultarse e hincharse las venas (venas varicosas). Las Control and instrumentation engineer y estar sensibles al cepillado y al hilo dental. Letitia Caul vez tenga necesidad de orinar con ms frecuencia porque el feto est ejerciendo presin sobre la vejiga. Puede sentir dolor en la espalda. Esto se debe a: Aumento de peso. Las hormonas del [place] articulaciones en la pelvis. Un cambio en el peso y los msculos que ayudan a Theatre manager su equilibrio. Siga estas instrucciones en su casa: [place] instrucciones del mdico en relacin con el uso de medicamentos. Durante el embarazo, hay medicamentos que pueden tomarse y 94 que no. No tome medicamentos a menos que lo haya autorizado el mdico. Tome vitaminas prenatales que contengan por lo menos [id number] (mcg) de cido flico. Comida y bebida Lleve una dieta saludable que incluya frutas y verduras frescas, cereales integrales, buenas fuentes de protenas como carnes [place], huevos o tofu, y productos lcteos descremados. Evite la carne cruda y el [place], la [place] y el queso sin Radio producer. Estos portan grmenes que pueden provocar dao tanto a usted como al beb. Es posible que tenga que tomar estas medidas para prevenir o tratar el estreimiento: Electronics engineer suficiente lquido como para Theatre manager la orina de color amarillo plido. Consumir alimentos ricos en fibra, como frijoles, cereales integrales, y frutas y verduras frescas. Limitar el consumo de alimentos ricos en grasa y azcares procesados, como los alimentos fritos o dulces. Actividad Haga ejercicio solamente como se lo haya indicado el mdico. La mayora de las personas pueden continuar su rutina de ejercicios durante el [place]. Intente realizar como mnimo 32mnutos de actividad fsica por lo menos 5das a la semana. Deje de hacer ejercicio si comienza a tAdministrator, arts  Deje de hacer ejercicio si le aparecen dolor o clicos en la parte baja del vientre o de la espalda. Evite hacer ejercicio si hace mucho calor o humedad, o si se  encuentra a una altitud elevada. Evite levantar pesos EMCOR. Si lo desea, puede seguir teniendo [employer], salvo que el mdico le indique lo contrario. Alivio del dolor y del Tree surgeon Use un sujetador que le brinde buen soporte para prevenir las molestias causadas por la sensibilidad en las [place]. Dese baos de asiento con agua tibia para Best boy o las molestias causadas por las hemorroides. Use una crema para las hemorroides si el mdico la autoriza. Descanse con las piernas levantadas (elevadas) si tiene calambres en las piernas o dolor en la parte baja de la espalda. Si tiene venas varicosas: Use medias de compresin como se lo haya indicado el mdico. Eleve los pies durante 26mnutos, 3 o 4veces por dTraining and development officer Limite el consumo de sal en su dieta. Seguridad Use el cinturn de seguridad en todo momento mientras conduce o va en auto. Hable con el mdico si es vctima de mTerex Corporationo fsico. Estilo de vida No se d baos de inmersin en agua caliente, baos turcos ni saunas. No se haga duchas vaginales. No use tampones ni toallas higinicas perfumadas. Evite el contacto con las bandejas sanitarias de los gatos y la tierra que estos animales usan. Estos elementos contienen bacterias que pueden causar defectos congnitos al beb y la posible prdida del feto debido a un aborto espontneo o muerte fetal. No use remedios a base de hierbas, alcohol, drogas ilegales ni medicamentos que no estn aprobados por el mdico. Las sustancias qumicas de estos productos pueden daar al beb. No consuma ningn producto que contenga nicotina o tabaco, como cigarrillos, cigarrillos electrnicos y tabaco de mHigher education careers adviser Si necesita ayuda para dejar de fumar, consulte al mdico. Instrucciones generales Durante una visita prenatal de rutina, el mViacomhar un examen fsico y oEquities trader Tambin le hablar sobre su salud general. Cumpla con todas las visitas de seguimiento. Esto es  importante. Pdale al mdico que la derive a clases de educacin prenatal en su localidad. Pida ayuda si tiene necesidades nutricionales o de asesoramiento dSolicitor El mdico puede aconsejarla o derivarla a especialistas para que la ayuden con diferentes necesidades. Dnde buscar ms informacin American Pregnancy Association (Asociacin Americana del Embarazo): americanpregnancy.org ASPX Corporationof Obstetricians and Gynecologists (Colegio Estadounidense de Obstetras y GAzure: aSwimmingTub.com.br Office on WHome Depot(O[place]: [email or web address].brComunquese con un mdico si tiene: Un dolor de cabeza que no desaparece despus de tTeacher, adult education Cambios en la visin o ve manchas delante de los ojos. Clicos leves, presin en la pelvis o dolor persistente en el abdomen. Nuseas persistentes, vmitos o diarrea. Secrecin vaginal con mal olor u orina con oUSAAftido. Dolor al oSu Grand Hinchazn sbita o extrema del rostro, las manos, los tobillos, los pies o las piernas. FCristy Hilts Busque ayuda de inmediato si: Tiene una prdida de lquido por la vagina. Tiene sangrado ligero o manchas vaginales. Tiene dolor intenso o clicos en el abdomen. Presenta dificultad para respirar. Siente dolor en el pecho. Tiene episodios de dKimberly-Clark No ha sentido a su beb moverse durante el perodo de tAgilent Technologiesindic el mdico. Tiene dolor, hinchazn o enrojecimiento nuevos o ms intensos en un brazo o una pierna. Resumen El segundo trimestre de embarazo va desde la semana 13 hasta la 27 (desde el mes 4 hasta el 6).  No use remedios a base de hierbas, alcohol, drogas ilegales ni medicamentos que no estn aprobados por el mdico. Las sustancias qumicas de estos productos pueden daar al beb. Haga ejercicio solamente como se lo haya indicado el mdico. La mayora de las personas pueden continuar su rutina de ejercicios durante el  [place]. Cumpla con todas las visitas de seguimiento. Esto es importante. Esta informacin no tiene Marine scientist el consejo del mdico. Asegresede hacerle al mdico cualquier pregunta que tenga. Document Revised: 05/09/2020 Document Reviewed: 05/09/2020 Elsevier Patient Education  2022 [place] anticonceptivo Contraception Choices La anticoncepcin, o los mtodos anticonceptivos, hace referencia a los mtodoso dispositivos que evitan el [place]. Mtodos hormonales  Implante anticonceptivo Un implante anticonceptivo consiste en un tubo delgado de plstico que contiene una hormona que evita el [place]. Es diferente de un dispositivo intrauterino (DIU). Un mdico lo inserta en la parte superior del brazo. Los implantespueden ser eficaces durante un mximo de 3 aos. Inyecciones de progestina sola Las inyecciones de progestina sola contienen progestina, una forma sinttica dela hormona progesterona. Un mdico las administra cada 3 meses. Pldoras anticonceptivas Las pldoras anticonceptivas son pastillas que contienen hormonas que evitan el [place]. Deben tomarse una vez al da, preferentemente a la misma hora cadada. Se necesita una receta para utilizar este mtodo anticonceptivo. Parche anticonceptivo El parche anticonceptivo contiene hormonas que evitan el [place]. Se coloca en la piel, debe cambiarse una vez a la semana durante tres semanas y debe retirarse en la cuarta semana. Se necesita una receta para Associate Professor. Anillo vaginal Un anillo vaginal contiene hormonas que evitan el embarazo. Se coloca en la vagina durante tres semanas y se retira en la cuarta semana. Luego se repite el proceso con un anillo nuevo. Se necesita una receta para Associate Professor. Anticonceptivo de emergencia Los anticonceptivos de emergencia son mtodos para evitar un embarazo despus de Best boy sexo sin proteccin. Vienen en forma de  pldora y pueden tomarse hasta 5 das despus de [place]. Funcionan mejor cuando se toman lo ms pronto posible luego de Merrill Lynch. La mayora de los anticonceptivos de emergencia estn disponibles sin receta mdica. Este mtodo no debe utilizarse como elnico mtodo anticonceptivo. Mtodos de barrera  Condn masculino Un condn masculino es una vaina delgada que se coloca sobre el pene durante el sexo. Los condones evitan que el esperma ingrese en el cuerpo de la [place]. Pueden utilizarse con un una sustancia que mata a los espermatozoides (espermicida) para aumentar la efectividad. Deben desecharse despus de un uso. Condn femenino Un condn femenino es una vaina blanda y holgada que se coloca en la vagina antes de [place]. El condn evita que el esperma ingrese en el cuerpo de Customer service manager. Deben desecharse despus de un uso. Diafragma Un diafragma es una barrera blanda con forma de cpula. Se inserta en la vagina antes del sexo, junto con un espermicida. El diafragma bloquea el ingreso de esperma en el tero, y el espermicida mata a los espermatozoides. El Designer, television/film set en la vagina durante 6 a 8 horas despus de Best boy sexo y deberetirarse en el plazo de las 24 horas. Un diafragma es recetado y colocado por un mdico. Debe reemplazarse cada 1 a 2 aos, despus de dar a luz, de aumentar ms de 15lb (6.8kg) y de [place]. Capuchn cervical Un capuchn cervical es una copa redonda y blanda de ltex o plstico que se coloca en el cuello uterino. Se inserta en la vagina  antes del sexo, junto con un espermicida. Bloquea el ingreso del esperma en el tero. El capuchn Medical illustrator durante 6 a 8 horas despus de Best boy sexo y debe retirarse en el plazo de las 48 horas. Un capuchn cervical debe ser recetado ycolocado por un mdico. Debe reemplazarse cada 2aos. Esponja Una esponja es una pieza blanda y circular de espuma de poliuretano que contiene espermicida. La  esponja ayuda a bloquear el ingreso de esperma en el tero, y el espermicida mata a los espermatozoides. Marylyn Ishihara, debe humedecerla e insertarla en la vagina. Debe insertarse antes de Merrill Lynch, debe permanecer dentro al menos durante 6 horas despus de Best boy sexo y deberetirarse y Aeronautical engineer en el plazo de las 30 horas. Espermicidas Los espermicidas son sustancias qumicas que matan o bloquean al esperma y no lo dejan ingresar al cuello uterino y al tero. Vienen en forma de crema, gel, supositorio, espuma o comprimido. Un espermicida debe insertarse en la vagina con un aplicador al menos 10 o 15 minutos antes de tener sexo para dar tiempo a que surta [place]. El proceso debe repetirse cada vez que tenga sexo. Losespermicidas no requieren receta mdica. Anticonceptivos intrauterinos Dispositivo intrauterino (DIU) Un DIU es un dispositivo en forma de T que se coloca en el tero. Existen dos tipos: DIU hormonal.Este tipo contiene progestina, una forma sinttica de la hormona progesterona. Este tipo puede permanecer colocado durante 3 a 5 aos. DIU de cobre.Este tipo est recubierto con un alambre de cobre. Puede permanecer colocado durante 10 aos. Mtodos anticonceptivos permanentes Ligadura de trompas en la mujer En este mtodo, se sellan, atan u obstruyen las trompas de Falopio durante [place] para Product/process development scientist que el vulo descienda hacia el tero. Esterilizacin histeroscpica En este mtodo, se coloca un implante pequeo y flexible dentro de cada trompa de Falopio. Los implantes hacen que se forme un tejido cicatricial en las trompas de Falopio y que las obstruya para que el espermatozoide no pueda llegar al vulo. El procedimiento demora alrededor de 3 meses para que seaefectivo. Debe utilizarse otro mtodo anticonceptivo durante esos 3 meses. Esterilizacin masculina Este es un procedimiento que consiste en atar los conductos que transportan el esperma (vasectoma). Luego del procedimiento,  el hombre Equities trader lquido (semen). Debe utilizarse otro mtodo anticonceptivo durante 3 meses despus delprocedimiento. Mtodos de planificacin natural Planificacin familiar natural En este mtodo, la pareja no tiene SPX Corporation la mujer podra quedar Derma. Mtodo calendario En este mtodo, la mujer realiza un seguimiento de la duracin de cada ciclo menstrual, identifica los MeadWestvaco que se puede producir un Media planner y no tiene sexo durante esos das. Mtodo de la ovulacin En este mtodo, la pareja evita tener sexo durante la ovulacin. Mtodo sintotrmico Este mtodo implica no tener sexo durante la ovulacin. Normalmente, la mujer comprueba la ovulacinal observar cambios en su temperatura y en la consistencia del moco cervical. Mtodo posovulacin En este mtodo, la pareja espera a que finalice la ovulacin para Adult nurse. Dnde buscar ms informacin Centers for Disease Control and Prevention (Centros para el Control y Publishing copy de Arboriculturist): http://www.wolf.info/ Resumen La anticoncepcin, o los mtodos anticonceptivos, hace referencia a los mtodos o dispositivos que evitan el [place]. Los mtodos anticonceptivos hormonales incluyen implantes, inyecciones, pastillas, parches, anillos vaginales y anticonceptivos de Freight forwarder. Los mtodos anticonceptivos de barrera pueden incluir condones masculinos, condones femeninos, diafragmas, capuchones cervicales, esponjas y espermicidas. Sears Holdings Corporation tipos de DIU (dispositivo intrauterino). Un DIU puede colocarse en  el tero de una mujer para evitar el embarazo durante 3 a 5 aos. La esterilizacin permanente puede realizarse mediante un procedimiento tanto en los hombres como en las mujeres. Los NIKE de Marine scientist natural implican no tener SPX Corporation la mujer podra quedar [place]. Esta informacin no tiene Marine scientist el consejo del mdico. Asegresede hacerle al mdico  cualquier pregunta que tenga. Document Revised: 05/31/2020 Document Reviewed: 05/31/2020 Elsevier Patient Education  [place].

## 2021-06-21 NOTE — Progress Notes (Signed)
Subjective:   Bonnie Morales is a 40 y.o. [phone] at 69w2dby 5wk [place] seen today for her first obstetrical visit.  Her obstetrical history is significant for advanced maternal age and c42 hx of pre-eclampsia, hx of CS with successful VBACs . Patient does not intend to breast feed. Pregnancy history fully reviewed.  Patient reports  occasional nausea and headaches .  HISTORY: OB History  Gravida Para Term Preterm AB Living  '4 3 2 1 '$ 0 3  SAB IAB Ectopic Multiple Live Births  0 0 0 0 3    # Outcome Date GA Lbr Len/2nd Weight Sex Delivery Anes PTL Lv  4 Current           3 Term 08/17/17 329w4d7:48 / 00:25 6 lb 6.8 oz (2.914 kg) F VBAC EPI  LIV     Name: Bonnie Morales     Apgar1: 9  Apgar5: 9  2 Term 08/28/15 3860w6d:04 / 03:28 6 lb 5.6 oz (2.88 kg) M Vag-Spont EPI  LIV     Name: Bonnie Morales     Apgar1: 8  Apgar5: 9  1 Preterm 05/25/04 36w39w0dlb 6.6 oz (2 kg)  CS-Unspec   LIV     Complications: Placenta Previa    Last pap smear was  08/21/2019 and was  NILM, neg HPV  Past Medical History:  Diagnosis Date   Chronic hypertension    History of kidney stones    passed stone   Hypertension    2014   Pre-eclampsia    Past Surgical History:  Procedure Laterality Date   CERVICAL CERCLAGE N/A 04/12/2017   Procedure: CERCLAGE CERVICAL;  Surgeon: Bonnie Morales;  [facility];  Service: Gynecology;  Laterality: N/A;   [place]  Problem Relation Age of Onset   Diabetes Mother    Hypertension Mother    Heart disease Mother        tachycardia   Hypertension Father    Mental retardation Sister    Cancer Paternal Grandmother    Social History   Tobacco Use   Smoking status: Former    Packs/day: 0.25    Years: 5.00    Pack years: 1.25    Types: Cigarettes    Quit date: 09/12/2016    Years since quitting: 4.7   Smokeless tobacco: Never  Vaping Use   Vaping Use: Never used   Substance Use Topics   Alcohol use: No   Drug use: No   Allergies  Allergen Reactions   Latex    Sulfa Antibiotics Hives    Reports high fever and red rash on arms   Current Outpatient Medications on File Prior to Visit  Medication Sig Dispense Refill   amLODipine (NORVASC) 5 MG tablet Take 1 tablet (5 mg total) daily by mouth. 30 tablet 0   aspirin 81 MG tablet Take 81 mg by mouth daily.     Prenatal Vit-Fe Fumarate-FA (PRENATAL MULTIVITAMIN) TABS tablet Take 1 tablet by mouth daily at 12 noon.     No current facility-administered medications on file prior to visit.     Exam   Vitals:   06/21/21 0959  BP: 136/75  Pulse: 61  Weight: 208 lb (94.3 kg)     System: General: well-developed, well-nourished female in no acute distress   Skin: normal coloration and turgor, no rashes   Neurologic: oriented,  normal, negative, normal mood   Extremities: normal strength, tone, and muscle mass, ROM of all joints is normal   HEENT PERRLA, extraocular movement intact and sclera clear, anicteric   Neck supple and no masses   Respiratory:  no respiratory distress       Assessment:   Pregnancy: [id number] Patient Active Problem List   Diagnosis Date Noted   Supervision of high risk pregnancy, antepartum 06/21/2021   Atypical squamous cell changes of undetermined significance (ASCUS) on cervical cytology with positive high risk human papilloma virus (HPV) 07/16/2018   Chronic hypertension 09/24/2017   History of cervical incompetence 09/24/2017   Language barrier 06/17/2017   History of VBAC 04/09/2017   History of eclampsia 04/09/2017     Plan:  1. Supervision of high risk pregnancy, antepartum Trial phenergan for nausea and flexeril for headaches Initial labs drawn. Continue prenatal vitamins. Genetic Screening discussed, NIPS: ordered, understands there may be a cost. Ultrasound discussed; fetal anatomic survey: ordered. Problem list reviewed and updated. The nature of  [place] with multiple MDs and other Advanced Practice Providers was explained to patient; also emphasized that residents, students are part of our team.  2. Chronic hypertension On amlodipine '5mg'$  daily, continue Already taking baby ASA  3. History of cervical incompetence Cervical shortening and funneling in prior pregnancy with rescue cerclage at 16 weeks Discussed w Dr. Ilda Basset who recommend cerclage in this pregnancy Schedule for prophylactic cerclage at 14 weeks, message sent  4. History of VBAC Desires TOLAC  5. History of eclampsia Taking ASA  6. Language barrier Spanish  7. Atypical squamous cell changes of undetermined significance (ASCUS) on cervical cytology with positive high risk human papilloma virus (HPV) On review of [facility] records appears that she had NILM/neg HPV test in 2020, not yet due for screening   Routine obstetric precautions reviewed. Return in 4 weeks (on 07/19/2021) for [facility], ob visit.

## 2021-06-22 LAB — CBC/D/PLT+RPR+RH+ABO+RUBIGG...
Antibody Screen: NEGATIVE
Basophils Absolute: 0 10*3/uL (ref 0.0–0.2)
Basos: 0 %
EOS (ABSOLUTE): 0.1 10*3/uL (ref 0.0–0.4)
Eos: 1 %
HCV Ab: <0.1 s/co ratio (ref 0.0–0.9)
HIV Screen 4th Generation wRfx: NONREACTIVE
Hematocrit: 40.5 % (ref 34.0–46.6)
Hemoglobin: 13.6 g/dL (ref 11.1–15.9)
Hepatitis B Surface Ag: NEGATIVE
Immature Grans (Abs): 0 10*3/uL (ref 0.0–0.1)
Immature Granulocytes: 0 %
Lymphocytes Absolute: 1.7 10*3/uL (ref 0.7–3.1)
Lymphs: 22 %
MCH: 29.5 pg (ref 26.6–33.0)
MCHC: 33.6 g/dL (ref 31.5–35.7)
MCV: 88 fL (ref 79–97)
Monocytes Absolute: 0.3 10*3/uL (ref 0.1–0.9)
Monocytes: 4 %
Neutrophils Absolute: 5.5 10*3/uL (ref 1.4–7.0)
Neutrophils: 73 %
Platelets: 332 10*3/uL (ref 150–450)
RBC: 4.61 x10E6/uL (ref 3.77–5.28)
RDW: 13.7 % (ref 11.7–15.4)
RPR Ser Ql: NONREACTIVE
Rh Factor: POSITIVE
Rubella Antibodies, IGG: 9.83 index (ref >0.99)
WBC: 7.6 10*3/uL (ref 3.4–10.8)

## 2021-06-22 LAB — HCV INTERPRETATION

## 2021-06-22 LAB — HEMOGLOBIN A1C
Est. average glucose Bld gHb Est-mCnc: 120 mg/dL
Hgb A1c MFr Bld: 5.8 % — ABNORMAL HIGH (ref 4.8–5.6)

## 2021-06-25 ENCOUNTER — Inpatient Hospital Stay (HOSPITAL_COMMUNITY)
Admission: AD | Admit: 2021-06-25 | Discharge: 2021-06-26 | Disposition: A | Discharge status: Home / Self Care | Payer: Self-pay | Attending: Obstetrics & Gynecology | Admitting: Obstetrics & Gynecology

## 2021-06-25 ENCOUNTER — Other Ambulatory Visit: Payer: Self-pay

## 2021-06-25 DIAGNOSIS — O26891 Other specified pregnancy related conditions, first trimester: Secondary | ICD-10-CM | POA: Insufficient documentation

## 2021-06-25 DIAGNOSIS — O209 Hemorrhage in early pregnancy, unspecified: Secondary | ICD-10-CM | POA: Insufficient documentation

## 2021-06-25 DIAGNOSIS — O09291 Supervision of pregnancy with other poor reproductive or obstetric history, first trimester: Secondary | ICD-10-CM | POA: Insufficient documentation

## 2021-06-25 DIAGNOSIS — O0990 Supervision of high risk pregnancy, unspecified, unspecified trimester: Secondary | ICD-10-CM

## 2021-06-25 DIAGNOSIS — Z87891 Personal history of nicotine dependence: Secondary | ICD-10-CM | POA: Insufficient documentation

## 2021-06-25 DIAGNOSIS — N930 Postcoital and contact bleeding: Secondary | ICD-10-CM

## 2021-06-25 DIAGNOSIS — Z79899 Other long term (current) drug therapy: Secondary | ICD-10-CM | POA: Insufficient documentation

## 2021-06-25 DIAGNOSIS — Z3A11 11 weeks gestation of pregnancy: Secondary | ICD-10-CM | POA: Insufficient documentation

## 2021-06-25 DIAGNOSIS — Z6790 Unspecified blood type, Rh positive: Secondary | ICD-10-CM | POA: Insufficient documentation

## 2021-06-25 DIAGNOSIS — R1030 Lower abdominal pain, unspecified: Secondary | ICD-10-CM | POA: Insufficient documentation

## 2021-06-25 LAB — URINE CULTURE, OB REFLEX

## 2021-06-25 LAB — CULTURE, OB URINE

## 2021-06-26 ENCOUNTER — Inpatient Hospital Stay (HOSPITAL_COMMUNITY): Payer: Self-pay

## 2021-06-26 ENCOUNTER — Encounter (HOSPITAL_COMMUNITY): Payer: Self-pay | Admitting: Obstetrics & Gynecology

## 2021-06-26 ENCOUNTER — Other Ambulatory Visit: Payer: Self-pay

## 2021-06-26 DIAGNOSIS — Z3A11 11 weeks gestation of pregnancy: Secondary | ICD-10-CM

## 2021-06-26 DIAGNOSIS — O209 Hemorrhage in early pregnancy, unspecified: Secondary | ICD-10-CM

## 2021-06-26 LAB — CBC
HCT: 35.1 % — ABNORMAL LOW (ref 36.0–46.0)
Hemoglobin: 12.2 g/dL (ref 12.0–15.0)
MCH: 29.8 pg (ref 26.0–34.0)
MCHC: 34.8 g/dL (ref 30.0–36.0)
MCV: 85.6 fL (ref 80.0–100.0)
Platelets: 305 10*3/uL (ref 150–400)
RBC: 4.1 MIL/uL (ref 3.87–5.11)
RDW: 13.4 % (ref 11.5–15.5)
WBC: 8.8 10*3/uL (ref 4.0–10.5)
nRBC: 0 % (ref 0.0–0.2)

## 2021-06-26 NOTE — MAU Note (Signed)
Vaginal bleeding started 11:40 pm, after sexual intercourse.  Soaked through [employer]. Pelvic pain after sex also.

## 2021-06-26 NOTE — MAU Provider Note (Signed)
History     CSN: [id number]  Arrival date and time: 06/25/21 2335   None     Chief Complaint  Patient presents with   Vaginal Bleeding   40 y.o. [id number] '@11'$ .0 wks presenting with VB. Reports onset around 2330 tonight. Had IC about 30 min prior. States bleeding was heavy. Denies clots. Denies LOF. Reports mild low abdominal cramping since. Has not treated it. Hx of cervical shortening in previous pregnancy that required rescue cerclage.   OB History     Gravida  4   Para  3   Term  2   Preterm  1   AB  0   Living  3      SAB  0   IAB  0   Ectopic  0   Multiple  0   Live Births  3           Past Medical History:  Diagnosis Date   Chronic hypertension    History of kidney stones    passed stone   Hypertension    2014   Pre-eclampsia     Past Surgical History:  Procedure Laterality Date   CERVICAL CERCLAGE N/A 04/12/2017   Procedure: CERCLAGE CERVICAL;  Surgeon: Chancy Milroy, MD;  Location: [facility];  Service: Gynecology;  Laterality: N/A;   CESAREAN SECTION     epidural - in [place]    Family History  Problem Relation Age of Onset   Diabetes Mother    Hypertension Mother    Heart disease Mother        tachycardia   Hypertension Father    Mental retardation Sister    Cancer Paternal Grandmother     Social History   Tobacco Use   Smoking status: Former    Packs/day: 0.25    Years: 5.00    Pack years: 1.25    Types: Cigarettes    Quit date: 09/12/2016    Years since quitting: 4.7   Smokeless tobacco: Never  Vaping Use   Vaping Use: Never used  Substance Use Topics   Alcohol use: No   Drug use: No    Allergies:  Allergies  Allergen Reactions   Latex    Sulfa Antibiotics Hives    Reports high fever and red rash on arms    Medications Prior to Admission  Medication Sig Dispense Refill Last Dose   amLODipine (NORVASC) 5 MG tablet Take 1 tablet (5 mg total) daily by mouth. 30 tablet 0 06/25/2021   aspirin 81 MG tablet Take 81  mg by mouth daily.   06/25/2021   Prenatal Vit-Fe Fumarate-FA (PRENATAL MULTIVITAMIN) TABS tablet Take 1 tablet by mouth daily at 12 noon.   06/25/2021   cyclobenzaprine (FLEXERIL) 10 MG tablet Take 1 tablet (10 mg total) by mouth every 8 (eight) hours as needed for muscle spasms. 30 tablet 1    promethazine (PHENERGAN) 25 MG tablet Take 1 tablet (25 mg total) by mouth every 6 (six) hours as needed for nausea or vomiting. 30 tablet 2     Review of Systems  Gastrointestinal:  Positive for abdominal pain.  Genitourinary:  Positive for vaginal bleeding.  Physical Exam   Blood pressure 121/67, pulse 63, temperature 98.2 F (36.8 C), temperature source Oral, resp. rate 16, height '5\' 5"'$  (1.651 m), weight 94.8 kg, last menstrual period 04/10/2021, SpO2 99 %, currently breastfeeding.  Physical Exam Vitals and nursing note reviewed. Exam conducted with a chaperone present.  Constitutional:  General: She is not in acute distress.    Appearance: Normal appearance.  HENT:     Head: Normocephalic and atraumatic.  Pulmonary:     Effort: Pulmonary effort is normal. No respiratory distress.  Abdominal:     Palpations: Abdomen is soft.     Tenderness: There is no abdominal tenderness.  Genitourinary:    Comments: External: no lesions or erythema Vagina: rugated, pink, moist, small amt thin bloody discharge, cleared with 2 fox swabs Cervix closed   Musculoskeletal:        General: Normal range of motion.     Cervical back: Normal range of motion.  Skin:    General: Skin is warm and dry.  Neurological:     General: No focal deficit present.     Mental Status: She is alert and oriented to person, place, and time.  Psychiatric:        Mood and Affect: Mood normal.        Behavior: Behavior normal.   Results for orders placed or performed during the hospital encounter of 06/25/21 (from the past 24 hour(s))  CBC     Status: Abnormal   Collection Time: 06/26/21  1:54 AM  Result Value Ref  Range   WBC 8.8 4.0 - 10.5 K/uL   RBC 4.10 3.87 - 5.11 MIL/uL   Hemoglobin 12.2 12.0 - 15.0 g/dL   HCT 35.1 (L) 36.0 - 46.0 %   MCV 85.6 80.0 - 100.0 fL   MCH 29.8 26.0 - 34.0 pg   MCHC 34.8 30.0 - 36.0 g/dL   RDW 13.4 11.5 - 15.5 %   Platelets 305 150 - 400 K/uL   nRBC 0.0 0.0 - 0.2 %    US OB Comp Less 14 Wks  Result Date: 06/26/2021 CLINICAL DATA:  Vaginal bleeding EXAM: OBSTETRIC <14 WK ULTRASOUND TECHNIQUE: Transabdominal ultrasound was performed for evaluation of the gestation as well as the maternal uterus and adnexal regions. COMPARISON:  05/21/2021 FINDINGS: Intrauterine gestational sac: Present Yolk sac:  Absent Embryo:  Present Cardiac Activity: Present Heart Rate: 160 bpm CRL:   44.8 mm   11 w 1 d                  [place] EDC: 01/14/2022 Subchorionic hemorrhage:  None visualized. Maternal uterus/adnexae: Right ovary is not well seen. Left ovary is within normal limits. 5.9 cm uterine fibroid is noted as well. IMPRESSION: Single live intrauterine gestation at 11 weeks 1 day. Uterine fibroid Electronically Signed   By: Inez Catalina M.D.   On: 06/26/2021 01:56    MAU Course  Procedures  MDM Labs and [place] ordered and reviewed. CBC stable. [place] confirms viable pregnancy, no [place]. Bleeding likely d/t sex. Discussed findings with pt and partner. Recommend pelvic rest. Stable for discharge home.   Assessment and Plan   1. Supervision of high risk pregnancy, antepartum   2. Vaginal bleeding in pregnancy, first trimester   3. [redacted] weeks gestation of pregnancy   4. Postcoital bleeding   5. Blood type, Rh positive    Discharge home Follow up at [facility] as scheduled SAB precautions  Allergies as of 06/26/2021       Reactions   Latex    Sulfa Antibiotics Hives   Reports high fever and red rash on arms        Medication List     TAKE these medications    amLODipine 5 MG tablet Commonly known as: NORVASC Take 1 tablet (5 mg  total) daily by mouth.   aspirin 81 MG tablet Take 81 mg by  mouth daily.   cyclobenzaprine 10 MG tablet Commonly known as: FLEXERIL Take 1 tablet (10 mg total) by mouth every 8 (eight) hours as needed for muscle spasms.   prenatal multivitamin Tabs tablet Take 1 tablet by mouth daily at 12 noon.   promethazine 25 MG tablet Commonly known as: PHENERGAN Take 1 tablet (25 mg total) by mouth every 6 (six) hours as needed for nausea or vomiting.       Interpreter present for all interactions  Julianne Handler, CNM 06/26/2021, 2:41 AM

## 2021-06-27 ENCOUNTER — Telehealth: Payer: Self-pay | Admitting: *Deleted

## 2021-06-27 DIAGNOSIS — O99810 Abnormal glucose complicating pregnancy: Secondary | ICD-10-CM

## 2021-06-27 NOTE — Telephone Encounter (Addendum)
-----   Message from Clarnce Flock, MD sent at 06/23/2021 12:37 PM EDT ----- Abnormal A1c, otherwise normal new OB serologies Patient does not have mychart Clinical staff please contact patient to let her know and schedule her for a 2hr GTT  8/16  1435  Called [name].  She did not answer and does not have voicemail set up. We will continue to reach out to pt with this information and plan of care. Pt will be notified of appointment for 2hr GTT test on 8/23 @ 0820 when we speak to her.

## 2021-06-28 NOTE — Telephone Encounter (Signed)
Called pt with interpreter Eda; result and appt date/time given. Pt asks about status of genetic testing. Per [facility] portal, Panorama shows low risk, female. Results given to patient. Explained that [facility] test is still processing.

## 2021-07-04 ENCOUNTER — Other Ambulatory Visit: Payer: Self-pay

## 2021-07-04 DIAGNOSIS — O99810 Abnormal glucose complicating pregnancy: Secondary | ICD-10-CM

## 2021-07-04 NOTE — Progress Notes (Unsigned)
Patient reports large amount of bleeding last week after intercourse- went to MAU for evaluation & "everything checked out okay with baby". Patient states she was instructed not to have intercourse. She reports intimacy with partner last night but did not have penetrative intercourse. Patient noted spotting after that which continues this morning with some mild cramping. She mentioned this happened her last pregnancy and was told to take progesterone. FHR obtained via informal bedside ultrasound. FHR 157bpm. Reassurance provided to patient. Discussed progesterone wouldn't be started until after cerclage per Dr Dione Plover. Advised if bleeding increased or pain became severe to go to MAU or call us if during office hours. Patient verbalized understanding. Rosemarie Ax used for [employer] interpreter.

## 2021-07-05 LAB — GLUCOSE TOLERANCE, 2 HOURS W/ 1HR
Glucose, 1 hour: 157 mg/dL (ref 65–179)
Glucose, 2 hour: 74 mg/dL (ref 65–152)
Glucose, Fasting: 97 mg/dL — ABNORMAL HIGH (ref 65–91)

## 2021-07-06 ENCOUNTER — Encounter: Payer: Self-pay | Admitting: Family Medicine

## 2021-07-06 DIAGNOSIS — O24415 Gestational diabetes mellitus in pregnancy, controlled by oral hypoglycemic drugs: Secondary | ICD-10-CM | POA: Insufficient documentation

## 2021-07-06 DIAGNOSIS — O24419 Gestational diabetes mellitus in pregnancy, unspecified control: Secondary | ICD-10-CM | POA: Insufficient documentation

## 2021-07-06 HISTORY — DX: Gestational diabetes mellitus in pregnancy, controlled by oral hypoglycemic drugs: O24.415

## 2021-07-07 ENCOUNTER — Telehealth: Payer: Self-pay | Admitting: *Deleted

## 2021-07-07 NOTE — Telephone Encounter (Signed)
I called Bonnie Morales with [facility] and informed her GTT shows she has GDM. I offered her appointment with Diabetes educator and she agreed to 07/11/21 2:15pm. She voices understanding. Allysa Governale,RN

## 2021-07-07 NOTE — Telephone Encounter (Signed)
-----   Message from Clarnce Flock, MD sent at 07/06/2021 10:44 AM EDT ----- Abnormal GTT Clinical staff please place referral to DM educator and send testing supplies, please call to notify patient (no mychart)

## 2021-07-11 ENCOUNTER — Other Ambulatory Visit: Payer: Self-pay | Admitting: *Deleted

## 2021-07-11 ENCOUNTER — Other Ambulatory Visit: Payer: Self-pay

## 2021-07-11 ENCOUNTER — Ambulatory Visit: Payer: Self-pay | Admitting: Registered"

## 2021-07-11 ENCOUNTER — Encounter: Payer: Self-pay | Attending: Family Medicine | Admitting: Registered"

## 2021-07-11 DIAGNOSIS — O99810 Abnormal glucose complicating pregnancy: Secondary | ICD-10-CM | POA: Insufficient documentation

## 2021-07-11 DIAGNOSIS — Z3A00 Weeks of gestation of pregnancy not specified: Secondary | ICD-10-CM | POA: Insufficient documentation

## 2021-07-11 DIAGNOSIS — O24419 Gestational diabetes mellitus in pregnancy, unspecified control: Secondary | ICD-10-CM

## 2021-07-11 NOTE — Progress Notes (Signed)
In-person interpreter, Raquel from [facility]  Patient was seen for Gestational Diabetes self-management on 07/11/21  Start time 1420 and End time 1520   Estimated due date: 01/15/21; [redacted]w[redacted]d Clinical: Medications: aspirin, prenatal Medical History: reviewed Labs: OGTT elevated FBS, A1c 5.8% on 06/21/21  Dietary and Lifestyle History: Patient denies prior GDM. Patient's mother is present for visit.  Physical Activity: ADLs Stress: not assessed Sleep: not assessed  24 hr Recall: First Meal: 2 eggs, tomato, coffee with 1/4 c milk, 1 1/2 tsp sugar Snack: fruit Second meal: hamburger, sugar-free coke Snack: fruit Third meal: 1 c rice, squash, pork, regular coke Snack: Beverages: 1 1/2 L water, coke, diet coke  NUTRITION INTERVENTION  Nutrition education (E-1) on the following topics:   Initial Follow-up  '[x]'$  '[]'$  Definition of Gestational Diabetes and early glucose intolerance prior to 3rd trimester '[]'$  '[]'$  Why dietary management is important in controlling blood glucose '[x]'$  '[]'$  Effects each nutrient has on blood glucose levels '[]'$  '[]'$  Simple carbohydrates vs complex carbohydrates '[]'$  '[]'$  Fluid intake '[x]'$  '[]'$  Creating a balanced meal plan '[x]'$  '[]'$  Carbohydrate counting  '[x]'$  '[]'$  When to check blood glucose levels '[x]'$  '[]'$  Proper blood glucose monitoring techniques '[x]'$  '[]'$  Effect of stress and stress reduction techniques  '[x]'$  '[]'$  Exercise effect on blood glucose levels, appropriate exercise during pregnancy '[]'$  '[]'$  Importance of limiting caffeine and abstaining from alcohol and smoking '[x]'$  '[]'$  Medications used for blood sugar control during pregnancy '[]'$  '[]'$  Hypoglycemia and rule of 15 '[x]'$  '[]'$  Postpartum self care  Blood glucose monitor given: [employer] Lot # [id number]: 134 mg/dL (patient ate lunch ~20 prior to appointment)  Patient instructed to monitor glucose levels: FBS: 60 - ? 95 mg/dL (some clinics use 90 for cutoff) 1 hour: ? 140 mg/dL 2 hour: ? 120 mg/dL  Patient received  handouts: Nutrition Diabetes and Pregnancy Carbohydrate Counting List  Patient will be seen for follow-up as needed.

## 2021-07-14 ENCOUNTER — Encounter: Payer: Self-pay | Admitting: Family Medicine

## 2021-07-19 ENCOUNTER — Other Ambulatory Visit: Payer: Self-pay

## 2021-07-19 ENCOUNTER — Telehealth (HOSPITAL_COMMUNITY): Payer: Self-pay | Admitting: *Deleted

## 2021-07-19 ENCOUNTER — Ambulatory Visit (INDEPENDENT_AMBULATORY_CARE_PROVIDER_SITE_OTHER): Payer: Self-pay | Admitting: Family Medicine

## 2021-07-19 ENCOUNTER — Encounter (HOSPITAL_COMMUNITY): Payer: Self-pay | Admitting: *Deleted

## 2021-07-19 ENCOUNTER — Encounter: Payer: Self-pay | Admitting: Family Medicine

## 2021-07-19 VITALS — BP 131/82 | HR 81 | Wt 207.8 lb

## 2021-07-19 DIAGNOSIS — Z758 Other problems related to medical facilities and other health care: Secondary | ICD-10-CM

## 2021-07-19 DIAGNOSIS — Z98891 History of uterine scar from previous surgery: Secondary | ICD-10-CM

## 2021-07-19 DIAGNOSIS — I10 Essential (primary) hypertension: Secondary | ICD-10-CM

## 2021-07-19 DIAGNOSIS — Z23 Encounter for immunization: Secondary | ICD-10-CM

## 2021-07-19 DIAGNOSIS — Z789 Other specified health status: Secondary | ICD-10-CM

## 2021-07-19 DIAGNOSIS — O0990 Supervision of high risk pregnancy, unspecified, unspecified trimester: Secondary | ICD-10-CM

## 2021-07-19 DIAGNOSIS — Z8742 Personal history of other diseases of the female genital tract: Secondary | ICD-10-CM

## 2021-07-19 DIAGNOSIS — Z8759 Personal history of other complications of pregnancy, childbirth and the puerperium: Secondary | ICD-10-CM

## 2021-07-19 DIAGNOSIS — R87810 Cervical high risk human papillomavirus (HPV) DNA test positive: Secondary | ICD-10-CM

## 2021-07-19 DIAGNOSIS — O24419 Gestational diabetes mellitus in pregnancy, unspecified control: Secondary | ICD-10-CM

## 2021-07-19 DIAGNOSIS — R87610 Atypical squamous cells of undetermined significance on cytologic smear of cervix (ASC-US): Secondary | ICD-10-CM

## 2021-07-19 NOTE — Progress Notes (Signed)
   Subjective:  Aija Shaqueria Wilmoth is a 40 y.o. [phone] at 83w2dbeing seen today for ongoing prenatal care.  She is currently monitored for the following issues for this high-risk pregnancy and has History of VBAC; History of eclampsia; Language barrier; Chronic hypertension; History of cervical incompetence; Atypical squamous cell changes of undetermined significance (ASCUS) on cervical cytology with positive high risk human papilloma virus (HPV); Supervision of high risk pregnancy, antepartum; and GDM (gestational diabetes mellitus) on their problem list.  Patient reports no complaints.  Contractions: Not present. Vag. Bleeding: None.  Movement: Present. Denies leaking of fluid.   The following portions of the patient's history were reviewed and updated as appropriate: allergies, current medications, past family history, past medical history, past social history, past surgical history and problem list. Problem list updated.  Objective:   Vitals:   07/19/21 1038  BP: 131/82  Pulse: 81  Weight: 207 lb 12.8 oz (94.3 kg)    Fetal Status: Fetal Heart Rate (bpm): 150   Movement: Present     General:  Alert, oriented and cooperative. Patient is in no acute distress.  Skin: Skin is warm and dry. No rash noted.   Cardiovascular: Normal heart rate noted  Respiratory: Normal respiratory effort, no problems with respiration noted  Abdomen: Soft, gravid, appropriate for gestational age. Pain/Pressure: Present     Pelvic: Vag. Bleeding: None     Cervical exam deferred        Extremities: Normal range of motion.  Edema: None  Mental Status: Normal mood and affect. Normal behavior. Normal judgment and thought content.   Urinalysis:      Assessment and Plan:  Pregnancy: [id number] 120w2d1. Supervision of high risk pregnancy, antepartum BP and FHR normal Accepts flu shot today - Flu Vaccine QUAD 16m110mo (Fluarix, Fluzone & Alfiuria Quad PF)  2. Chronic hypertension Stable on amlodipine 5  mg  3. Gestational diabetes mellitus (GDM) in second trimester, gestational diabetes method of control unspecified Abnormal A1c at OB intake and failed 2hr GTT  Forgot log Reports has only had one value above 120 which was 121 Fast[place] are around 95-97 Will have her send picture via mychart Will schedule her for fetal echo  4. History of cervical incompetence Scheduled for cerclage next week  5. History of VBAC Desires TOLAC  6. History of eclampsia On ASA  7. Language barrier Spanish  8. Atypical squamous cell changes of undetermined significance (ASCUS) on cervical cytology with positive high risk human papilloma virus (HPV) NILM actually in 2020 with neg HPV Repeat 2023-2025  Preterm labor symptoms and general obstetric precautions including but not limited to vaginal bleeding, contractions, leaking of fluid and fetal movement were reviewed in detail with the patient. Please refer to After Visit Summary for other counseling recommendations.  [facility]16[facility]2021[facility].   EckClarnce FlockD

## 2021-07-19 NOTE — Telephone Encounter (Signed)
Preadmission screen Interpreter number [phone] Take amlodipine as prescribed.  NPO after midnight, no other medications arrive at 0900.  Questions answered and instructions given with the interpreter.

## 2021-07-19 NOTE — Patient Instructions (Signed)
Eleccin del mtodo anticonceptivo Contraception Choices La anticoncepcin, o los mtodos anticonceptivos, hace referencia a los mtodos o dispositivos que evitan el [place]. Mtodos hormonales Implante anticonceptivo Un implante anticonceptivo consiste en un tubo delgado de plstico que contiene una hormona que evita el [place]. Es diferente de un dispositivo intrauterino (DIU). Un mdico lo inserta en la parte superior del brazo. Los implantes pueden ser eficaces durante un mximo de 3 aos. Inyecciones de progestina sola Las inyecciones de progestina sola contienen progestina, una forma sinttica de la hormona progesterona. Un mdico las administra cada 3 meses. Pldoras anticonceptivas Las pldoras anticonceptivas son pastillas que contienen hormonas que evitan el [place]. Deben tomarse una vez al da, preferentemente a [place]. Se necesita una receta para utilizar este mtodo anticonceptivo. Parche anticonceptivo El parche anticonceptivo contiene hormonas que evitan el [place]. Se coloca en la piel, debe cambiarse una vez a la semana durante tres semanas y debe retirarse en la cuarta semana. Se necesita una receta para utilizar este mtodo anticonceptivo. Anillo vaginal Un anillo vaginal contiene hormonas que evitan el embarazo. Se coloca en la vagina durante tres semanas y se retira en la cuarta semana. Luego se repite el proceso con un anillo nuevo. Se necesita una receta para utilizar este mtodo anticonceptivo. Anticonceptivo de emergencia Los anticonceptivos de emergencia son mtodos para evitar un embarazo despus de Best boy sexo sin proteccin. Vienen en forma de pldora y pueden tomarse hasta 5 das despus de [place]. Funcionan mejor cuando se toman lo ms pronto posible luego de [employer]ill [employer]. La mayora de los anticonceptivos de emergencia estn disponibles sin receta mdica. Este mtodo no debe utilizarse como el nico mtodo anticonceptivo. Mtodos de  barrera Condn masculino Un condn masculino es una vaina delgada que se coloca sobre el pene durante el sexo. Los condones evitan que el esperma ingrese en el cuerpo de la [place]. Pueden utilizarse con un una sustancia que mata a los espermatozoides (espermicida) para aumentar la efectividad. Deben desecharse despus de un uso. Condn femenino Un condn femenino es una vaina blanda y holgada que se coloca en la vagina antes de [place]. El condn evita que el esperma ingrese en el cuerpo de la [place]. Deben desecharse despus de un uso. Diafragma Un diafragma es una barrera blanda con forma de cpula. Se inserta en la vagina antes del sexo, junto con un espermicida. El diafragma bloquea el ingreso de esperma en el tero, y el espermicida mata a los espermatozoides. El Designer, television/film set en la vagina durante 6 a 8 horas despus de Best boy sexo y debe retirarse en el plazo de las 24 horas. Un diafragma es recetado y colocado por un mdico. Debe reemplazarse cada 1 a 2 aos, despus de dar a luz, de aumentar ms de 15lb (6.8kg) y de [place] plvica. Capuchn cervical Un capuchn cervical es una copa redonda y blanda de ltex o plstico que se coloca en el cuello uterino. Se inserta en la vagina antes del sexo, junto con un espermicida. Bloquea el ingreso del esperma en el tero. El capuchn Medical illustrator durante 6 a 8 horas despus de Best boy sexo y debe retirarse en el plazo de las 48 horas. Un capuchn cervical debe ser recetado y colocado por un mdico. Debe reemplazarse cada 2aos. Esponja Una esponja es una pieza blanda y circular de espuma de poliuretano que contiene espermicida. La esponja ayuda a bloquear el ingreso de esperma en el tero, y el espermicida mata a  los espermatozoides. Marylyn Ishihara, debe humedecerla e insertarla en la vagina. Debe insertarse antes de [employer]ill [employer], debe permanecer dentro al menos durante 6 horas despus de tener sexo y debe retirarse y  Aeronautical engineer en el plazo de las 30 horas. Espermicidas Los espermicidas son sustancias qumicas que matan o bloquean al esperma y no lo dejan ingresar al cuello uterino y al tero. Vienen en forma de crema, gel, supositorio, espuma o comprimido. Un espermicida debe insertarse en la vagina con un aplicador al menos 10 o 15 minutos antes de tener sexo para dar tiempo a que surta [place]. El proceso debe repetirse cada vez que tenga sexo. Los espermicidas no requieren Furniture conservator/restorer. Anticonceptivos intrauterinos Dispositivo intrauterino (DIU) Un DIU es un dispositivo en forma de T que se coloca en el tero. Existen dos tipos: DIU hormonal.Este tipo contiene progestina, una forma sinttica de la hormona progesterona. Este tipo puede permanecer colocado durante 3 a 5 aos. DIU de cobre.Este tipo est recubierto con un alambre de cobre. Puede permanecer colocado durante 10 aos. Mtodos anticonceptivos permanentes Ligadura de trompas en la mujer En este mtodo, se sellan, atan u obstruyen las trompas de Falopio durante una ciruga para Product/process development scientist que el vulo descienda [place]. Esterilizacin histeroscpica En este mtodo, se coloca un implante pequeo y flexible dentro de cada trompa de Falopio. Los implantes hacen que se forme un tejido cicatricial en las trompas de Falopio y que las obstruya para que el espermatozoide no pueda llegar al vulo. El procedimiento demora alrededor de 3 meses para que sea [place]. Debe utilizarse otro mtodo anticonceptivo durante esos 3 meses. Esterilizacin masculina Este es un procedimiento que consiste en atar los conductos que transportan el esperma (vasectoma). Luego del procedimiento, el hombre Equities trader lquido (semen). Debe utilizarse otro mtodo anticonceptivo durante 3 meses despus del procedimiento. Mtodos de planificacin natural Planificacin familiar natural En este mtodo, la pareja no tiene SP[employer] Corporation la mujer podra quedar  [place]. Mtodo calendario En este mtodo, la mujer realiza un seguimiento de la duracin de cada ciclo menstrual, identifica los [employer] que se puede producir un Media planner y no tiene sexo durante esos das. Mtodo de la ovulacin En este mtodo, la pareja evita tener sexo durante la ovulacin. Mtodo sintotrmico Este mtodo implica no tener sexo durante la ovulacin. Normalmente, la mujer comprueba la ovulacin al observar cambios en su temperatura y en la consistencia del moco cervical. Mtodo posovulacin En este mtodo, la pareja espera a que finalice la ovulacin para Adult nurse. Dnde buscar ms informacin Centers for Disease Control and Prevention (Centros para el Control y Publishing copy de Arboriculturist): http://www.wolf.info/ Resumen La anticoncepcin, o los mtodos anticonceptivos, hace referencia a los mtodos o dispositivos que evitan el [place]. Los mtodos anticonceptivos hormonales incluyen implantes, inyecciones, pastillas, parches, anillos vaginales y anticonceptivos de Freight forwarder. Los mtodos anticonceptivos de barrera pueden incluir condones masculinos, condones femeninos, diafragmas, capuchones cervicales, esponjas y espermicidas. [employer] Holdings Corporation tipos de DIU (dispositivo intrauterino). Un DIU puede colocarse en el tero de una mujer para evitar el embarazo durante 3 a 5 aos. La esterilizacin permanente puede realizarse mediante un procedimiento tanto en los hombres como en las mujeres. Los NIKE de Marine scientist natural implican no tener [employer] Corporation la mujer podra quedar [place]. Esta informacin no tiene Marine scientist el consejo del mdico. Asegrese de hacerle al mdico cualquier pregunta que tenga. Document Revised: 05/31/2020 Document Reviewed: 05/31/2020 Elsevier Patient Education  2022 Elsevier  Inc.  

## 2021-07-20 ENCOUNTER — Encounter: Payer: Self-pay | Admitting: Family Medicine

## 2021-07-24 ENCOUNTER — Encounter (HOSPITAL_COMMUNITY): Payer: Self-pay | Admitting: Obstetrics and Gynecology

## 2021-07-24 ENCOUNTER — Encounter (HOSPITAL_COMMUNITY): Admission: RE | Admit: 2021-07-24 | Payer: Self-pay | Source: Ambulatory Visit

## 2021-07-25 ENCOUNTER — Inpatient Hospital Stay (HOSPITAL_COMMUNITY): Payer: Self-pay | Admitting: Anesthesiology

## 2021-07-25 ENCOUNTER — Ambulatory Visit (HOSPITAL_COMMUNITY)
Admission: RE | Admit: 2021-07-25 | Discharge: 2021-07-25 | Disposition: A | Discharge status: Home / Self Care | Payer: Self-pay | Attending: Obstetrics and Gynecology | Admitting: Obstetrics and Gynecology

## 2021-07-25 ENCOUNTER — Encounter (HOSPITAL_COMMUNITY): Payer: Self-pay | Admitting: Obstetrics and Gynecology

## 2021-07-25 ENCOUNTER — Other Ambulatory Visit: Payer: Self-pay

## 2021-07-25 ENCOUNTER — Telehealth (HOSPITAL_COMMUNITY): Payer: Self-pay | Admitting: *Deleted

## 2021-07-25 ENCOUNTER — Encounter (HOSPITAL_COMMUNITY)
Admission: RE | Disposition: A | Discharge status: Home / Self Care | Payer: Self-pay | Source: Home / Self Care | Attending: Obstetrics and Gynecology

## 2021-07-25 DIAGNOSIS — Z7982 Long term (current) use of aspirin: Secondary | ICD-10-CM | POA: Insufficient documentation

## 2021-07-25 DIAGNOSIS — Z3A15 15 weeks gestation of pregnancy: Secondary | ICD-10-CM | POA: Insufficient documentation

## 2021-07-25 DIAGNOSIS — N883 Incompetence of cervix uteri: Secondary | ICD-10-CM

## 2021-07-25 DIAGNOSIS — Z79899 Other long term (current) drug therapy: Secondary | ICD-10-CM | POA: Insufficient documentation

## 2021-07-25 DIAGNOSIS — Z882 Allergy status to sulfonamides status: Secondary | ICD-10-CM | POA: Insufficient documentation

## 2021-07-25 DIAGNOSIS — Z87891 Personal history of nicotine dependence: Secondary | ICD-10-CM | POA: Insufficient documentation

## 2021-07-25 DIAGNOSIS — Z91040 Latex allergy status: Secondary | ICD-10-CM | POA: Insufficient documentation

## 2021-07-25 DIAGNOSIS — O3432 Maternal care for cervical incompetence, second trimester: Secondary | ICD-10-CM | POA: Insufficient documentation

## 2021-07-25 HISTORY — PX: CERVICAL CERCLAGE: SHX1329

## 2021-07-25 LAB — CBC
HCT: 37.1 % (ref 36.0–46.0)
Hemoglobin: 12.2 g/dL (ref 12.0–15.0)
MCH: 29.3 pg (ref 26.0–34.0)
MCHC: 32.9 g/dL (ref 30.0–36.0)
MCV: 89 fL (ref 80.0–100.0)
Platelets: 283 10*3/uL (ref 150–400)
RBC: 4.17 MIL/uL (ref 3.87–5.11)
RDW: 13.7 % (ref 11.5–15.5)
WBC: 7 10*3/uL (ref 4.0–10.5)
nRBC: 0 % (ref 0.0–0.2)

## 2021-07-25 LAB — GLUCOSE, CAPILLARY
Glucose-Capillary: 94 mg/dL (ref 70–99)
Glucose-Capillary: 82 mg/dL (ref 70–99)

## 2021-07-25 SURGERY — CERCLAGE, CERVIX, VAGINAL APPROACH
Anesthesia: Spinal

## 2021-07-25 MED ORDER — SOD CITRATE-CITRIC ACID 500-334 MG/5ML PO SOLN
30.0000 mL | ORAL | Status: AC
  Administered 2021-07-25: 30 mL via ORAL

## 2021-07-25 MED ORDER — FENTANYL CITRATE (PF) 100 MCG/2ML IJ SOLN
INTRAMUSCULAR | Status: DC | PRN
  Administered 2021-07-25: 25 ug via INTRATHECAL

## 2021-07-25 MED ORDER — SOD CITRATE-CITRIC ACID 500-334 MG/5ML PO SOLN
ORAL | Status: AC
  Filled 2021-07-25: qty 30

## 2021-07-25 MED ORDER — KETOROLAC TROMETHAMINE 30 MG/ML IJ SOLN: 30.0000 mg | Freq: Once | INTRAMUSCULAR | Status: DC

## 2021-07-25 MED ORDER — KETOROLAC TROMETHAMINE 30 MG/ML IJ SOLN
INTRAMUSCULAR | Status: AC
  Filled 2021-07-25: qty 1

## 2021-07-25 MED ORDER — ACETAMINOPHEN 500 MG PO TABS
1000.0000 mg | ORAL_TABLET | ORAL | Status: AC
  Administered 2021-07-25: 1000 mg via ORAL

## 2021-07-25 MED ORDER — LACTATED RINGERS IV SOLN: INTRAVENOUS | Status: DC

## 2021-07-25 MED ORDER — DIPHENHYDRAMINE HCL 50 MG/ML IJ SOLN
INTRAMUSCULAR | Status: AC
  Filled 2021-07-25: qty 1

## 2021-07-25 MED ORDER — ACETAMINOPHEN 500 MG PO TABS
ORAL_TABLET | ORAL | Status: AC
  Filled 2021-07-25: qty 2

## 2021-07-25 MED ORDER — POVIDONE-IODINE 10 % EX SWAB
2.0000 "application " | Freq: Once | CUTANEOUS | Status: AC
  Administered 2021-07-25: 2 via TOPICAL

## 2021-07-25 MED ORDER — PROGESTERONE 200 MG PO CAPS: 200.0000 mg | ORAL_CAPSULE | Freq: Every day | ORAL | 8 refills | Status: DC

## 2021-07-25 MED ORDER — ONDANSETRON HCL 4 MG/2ML IJ SOLN
INTRAMUSCULAR | Status: DC | PRN
  Administered 2021-07-25: 4 mg via INTRAVENOUS

## 2021-07-25 MED ORDER — CHLOROPROCAINE HCL 50 MG/5ML IT SOLN
INTRATHECAL | Status: AC
  Filled 2021-07-25: qty 5

## 2021-07-25 MED ORDER — KETOROLAC TROMETHAMINE 15 MG/ML IJ SOLN
15.0000 mg | INTRAMUSCULAR | Status: AC
  Administered 2021-07-25: 15 mg via INTRAVENOUS
  Filled 2021-07-25: qty 1

## 2021-07-25 MED ORDER — FENTANYL CITRATE (PF) 250 MCG/5ML IJ SOLN
INTRAMUSCULAR | Status: DC | PRN
  Administered 2021-07-25 (×2): 25 ug via INTRAVENOUS
  Administered 2021-07-25: 75 ug via INTRAVENOUS

## 2021-07-25 MED ORDER — CHLOROPROCAINE HCL 1 % IJ SOLN
INTRAMUSCULAR | Status: DC | PRN
  Administered 2021-07-25: 4 mL

## 2021-07-25 MED ORDER — DIPHENHYDRAMINE HCL 50 MG/ML IJ SOLN
12.5000 mg | Freq: Once | INTRAMUSCULAR | Status: AC
  Administered 2021-07-25: 12.5 mg via INTRAVENOUS

## 2021-07-25 SURGICAL SUPPLY — 16 items
CANISTER SUCT 3000ML PPV (MISCELLANEOUS) ×2 IMPLANT
CATH ROBINSON RED A/P 16FR (CATHETERS) ×2 IMPLANT
GLOVE BIO SURGEON STRL SZ7.5 (GLOVE) ×4 IMPLANT
GOWN STRL REUS W/TWL LRG LVL3 (GOWN DISPOSABLE) ×2 IMPLANT
GOWN STRL REUS W/TWL XL LVL3 (GOWN DISPOSABLE) ×2 IMPLANT
PACK VAGINAL MINOR WOMEN LF (CUSTOM PROCEDURE TRAY) ×2 IMPLANT
PAD OB MATERNITY 4.3X12.25 (PERSONAL CARE ITEMS) ×2 IMPLANT
PAD PREP 24X48 CUFFED NSTRL (MISCELLANEOUS) ×2 IMPLANT
SUT ETHIBOND 0 (SUTURE) ×2 IMPLANT
SUT MERSILENE FIBER S 5 CTX 12 (SUTURE) ×2 IMPLANT
SUT SILK 2 0 SH (SUTURE) ×2 IMPLANT
SYR BULB IRRIGATION 50ML (SYRINGE) ×2 IMPLANT
TOWEL OR 17X24 6PK STRL BLUE (TOWEL DISPOSABLE) ×4 IMPLANT
TRAY FOLEY W/BAG SLVR 14FR (SET/KITS/TRAYS/PACK) ×2 IMPLANT
TUBING NON-CON 1/4 X 20 CONN (TUBING) ×2 IMPLANT
YANKAUER SUCT BULB TIP NO VENT (SUCTIONS) ×2 IMPLANT

## 2021-07-25 NOTE — H&P (Signed)
Bonnie Morales is an 40 y.o. female. [id number] 15 1/7 weeks with known incompetent cervix. Here today for cerclage placement.  Pt denies vaginal bleeding or LOF.     Menstrual History: Menarche age: 89 Patient's last menstrual period was 04/10/2021 (exact date).    Past Medical History:  Diagnosis Date   Chronic hypertension    Gestational diabetes    History of kidney stones    passed stone   Hypertension    2014   Pre-eclampsia     Past Surgical History:  Procedure Laterality Date   CERVICAL CERCLAGE N/A 04/12/2017   Procedure: CERCLAGE CERVICAL;  Surgeon: Chancy Milroy, MD;  Location: [facility];  Service: Gynecology;  Laterality: N/A;   CESAREAN SECTION     epidural - in [place]    Family History  Problem Relation Age of Onset   Diabetes Mother    Hypertension Mother    Heart disease Mother        tachycardia   Hypertension Father    Mental retardation Sister    Cancer Paternal Grandmother     Social History:  reports that she quit smoking about 4 years ago. Her smoking use included cigarettes. She has a 1.25 pack-year smoking history. She has never used smokeless tobacco. She reports that she does not drink alcohol and does not use drugs.  Allergies:  Allergies  Allergen Reactions   Latex     burning   Sulfa Antibiotics Hives    Reports high fever and red rash on arms    Medications Prior to Admission  Medication Sig Dispense Refill Last Dose   acetaminophen (TYLENOL) 500 MG tablet Take 500 mg by mouth every 6 (six) hours as needed for moderate pain.   Past Month   amLODipine (NORVASC) 5 MG tablet Take 1 tablet (5 mg total) daily by mouth. (Patient taking differently: Take 5 mg by mouth 2 (two) times daily.) 30 tablet 0 Past Month   aspirin 81 MG tablet Take 81 mg by mouth daily.   07/24/2021   Prenatal Vit-Fe Fumarate-FA (PRENATAL MULTIVITAMIN) TABS tablet Take 1 tablet by mouth daily.   07/24/2021   promethazine (PHENERGAN) 25 MG tablet Take 1 tablet (25  mg total) by mouth every 6 (six) hours as needed for nausea or vomiting. (Patient not taking: Reported on 07/24/2021) 30 tablet 2 Not Taking    Review of Systems  Constitutional: Negative.   Respiratory: Negative.    Cardiovascular: Negative.   Gastrointestinal: Negative.   Genitourinary: Negative.    Blood pressure 134/71, pulse 88, temperature 98.6 F (37 C), temperature source Oral, resp. rate 16, height '5\' 5"'$  (1.651 m), weight 94.3 kg, last menstrual period 04/10/2021, currently breastfeeding. Physical Exam Constitutional:      Appearance: Normal appearance.  Cardiovascular:     Rate and Rhythm: Normal rate and regular rhythm.  Pulmonary:     Effort: Pulmonary effort is normal.     Breath sounds: Normal breath sounds.  Abdominal:     General: Bowel sounds are normal.     Palpations: Abdomen is soft.  Genitourinary:    Comments: Deferred to OR Neurological:     Mental Status: She is alert.    No results found for this or any previous visit (from the past 24 hour(s)).  No results found.  Assessment/Plan: IUP 15 1/7 weeks Incompetent cervix  Cerclage placement reviewed with pt. R/B/Post op care, including pelvic rest and vaginal progesterone discussed. Pt verbalized understanding. Live interrupter used for discussion.  To OR when ready.      Chancy Milroy 07/25/2021, 10:31 AM

## 2021-07-25 NOTE — Transfer of Care (Signed)
Immediate Anesthesia Transfer of Care Note  Patient: Bonnie Morales  Procedure(s) Performed: CERCLAGE CERVICAL  Patient Location: PACU  Anesthesia Type:Spinal  Level of Consciousness: awake, alert  and oriented  Airway & Oxygen Therapy: Patient Spontanous Breathing  Post-op Assessment: Report given to RN and Post -op Vital signs reviewed and stable  Post vital signs: Reviewed and stable Temp 97.7  Last Vitals:  Vitals Value Taken Time  BP 114/57 07/25/21 1301  Temp    Pulse 59 07/25/21 1307  Resp 20 07/25/21 1307  SpO2 100 % 07/25/21 1307  Vitals shown include unvalidated device data.  Last Pain:  Vitals:   07/25/21 1017  TempSrc: Oral         Complications: No notable events documented.

## 2021-07-25 NOTE — OR Nursing (Signed)
Pt dizzy after walking to BR foley cath dc back to bed anesthesia notified and orders rec  IV reconnected with LR   Tol ginger ale and crackers

## 2021-07-25 NOTE — Anesthesia Postprocedure Evaluation (Signed)
Anesthesia Post Note  Patient: Bonnie Morales  Procedure(s) Performed: CERCLAGE CERVICAL     Patient location during evaluation: Mother Baby Anesthesia Type: Spinal Level of consciousness: oriented and awake and alert Pain management: pain level controlled Vital Signs Assessment: post-procedure vital signs reviewed and stable Respiratory status: spontaneous breathing and respiratory function stable Cardiovascular status: blood pressure returned to baseline and stable Postop Assessment: no headache, no backache, no apparent nausea or vomiting and able to ambulate Anesthetic complications: no   No notable events documented.  Last Vitals:  Vitals:   07/25/21 1403 07/25/21 1409  BP: (!) 109/53 (!) 87/32  Pulse:  (!) 55  Resp: 20   Temp: 36.8 C   SpO2:  100%    Last Pain:  Vitals:   07/25/21 1403  TempSrc: Oral  PainSc:    Pain Goal:                Epidural/Spinal Function Cutaneous sensation: Pins and Needles (07/25/21 1409), Patient able to flex knees: Yes (07/25/21 1409), Patient able to lift hips off bed: Yes (07/25/21 1409), Back pain beyond tenderness at insertion site: No (07/25/21 1409)  Merlinda Frederick

## 2021-07-25 NOTE — Op Note (Signed)
Preoperative diagnosis: Incompetent cervix , IUP 15 1/7 weeks  Postoperative diagnosis: Same  Procedure: Cervical cerclage  Surgeon: Legrand Como L. Rip Harbour, M.D.  Anesthesia: Spinal  Findings: External os 2 cm, internal os 1 cm  Estimated blood loss: < 25 cc  Specimens: None  Reason for procedure: Bonnie Morales [phone] [redacted]w[redacted]d with known incompetent cervix.   Procedure: Patient was taken to the operating room where spinal analgesia was administered. She was prepped and draped in the usual sterile fashion.  A Foley catheter is used to drain her bladder. A timeout was performed.  The patient was in dorsal lithotomy.  A weighted speculum was placed inside the vagina.  A Deaver was used anteriorly. The cervix was grasped with an open ring  forcep. A 5 mm Mersilene band interlaced with 0 Ethibond was placed in a pursestring fashion. Starting and ending at 12 o'clock. The Mersilene was tied down and the knot secured with 2/0 silk to prevent the knot from backing out. The Ethibond was tied down as well to the point where a red rubber cathter would not place througt the endocervical cannel.  All instrument, needle and lap counts were correct x 2. The patient was taken to recovery in stable condition.  MAudrie LiaErvinMD 07/25/2021 1:14 PM I

## 2021-07-25 NOTE — Discharge Summary (Signed)
Physician Discharge Summary  Patient ID: Bonnie Morales MRN: [id number] DOB/AGE: [birth year] 40 y.o.  Admit date: 07/25/2021 Discharge date: 07/25/2021  Admission Diagnoses:IUP 15 1/7 weeks with incompetent cervix  Discharge Diagnoses:  Active Problems:   Incompetence of cervix   Discharged Condition: good  Hospital Course: Pt was admitted for vaginal cerclage placement. See OP note for additional information.  FHT's verified after procedure. Pt progressed to up to restroom without problems and tolerating diet. Discharge instructions, medications and follow up were reviewed with the pt prior to procedure with a live interrupter Pt was discharged home from PACU once she meet discharge requirements.  Consults: None  Significant Diagnostic Studies: NA  Treatments: surgery: vaginal cerclage  Discharge Exam: Blood pressure 92/79, pulse (!) 53, temperature 97.7 F (36.5 C), temperature source Oral, resp. rate (!) 23, height '5\' 5"'$  (1.651 m), weight 94.3 kg, last menstrual period 04/10/2021, SpO2 100 %, currently breastfeeding. Lungs clear Heart RRR Abd soft + BS Ext non tender  Disposition: Discharge disposition: 01-Home or Self Care       Discharge Instructions     Discharge activity:  No Restrictions   Complete by: As directed    Discharge diet:  No restrictions   Complete by: As directed    Do not have sex or do anything that might make you have an orgasm   Complete by: As directed    Notify physician for a general feeling that "something is not right"   Complete by: As directed    Notify physician for increase or change in vaginal discharge   Complete by: As directed    Notify physician for intestinal cramps, with or without diarrhea, sometimes described as "gas pain"   Complete by: As directed    Notify physician for leaking of fluid   Complete by: As directed    Notify physician for low, dull backache, unrelieved by heat or Tylenol   Complete by: As  directed    Notify physician for menstrual like cramps   Complete by: As directed    Notify physician for pelvic pressure   Complete by: As directed    Notify physician for uterine contractions.  These may be painless and feel like the uterus is tightening or the baby is  "balling up"   Complete by: As directed    Notify physician for vaginal bleeding   Complete by: As directed    PRETERM LABOR:  Includes any of the follwing symptoms that occur between 20 - [redacted] weeks gestation.  If these symptoms are not stopped, preterm labor can result in preterm delivery, placing your baby at risk   Complete by: As directed       Allergies as of 07/25/2021       Reactions   Latex    burning   Sulfa Antibiotics Hives   Reports high fever and red rash on arms        Medication List     STOP taking these medications    promethazine 25 MG tablet Commonly known as: PHENERGAN       TAKE these medications    acetaminophen 500 MG tablet Commonly known as: TYLENOL Take 500 mg by mouth every 6 (six) hours as needed for moderate pain.   amLODipine 5 MG tablet Commonly known as: NORVASC Take 1 tablet (5 mg total) daily by mouth. What changed: when to take this   aspirin 81 MG tablet Take 81 mg by mouth daily.   prenatal multivitamin Tabs tablet  Take 1 tablet by mouth daily.   progesterone 200 MG capsule Commonly known as: Prometrium Place 1 capsule (200 mg total) vaginally daily. Start taking on: July 30, 2021        [place] [facility] [place] Follow up in 2 week(s).   Specialty: Obstetrics and Gynecology Why: Post op cerclage with MD Contact information: [place] [phone]                Signed: Chancy Milroy 07/25/2021, 1:45 PM

## 2021-07-25 NOTE — Anesthesia Preprocedure Evaluation (Signed)
Anesthesia Evaluation  Patient identified by MRN, date of birth, ID band Patient awake    Reviewed: Allergy & Precautions, NPO status , Patient's Chart, lab work & pertinent test results  Airway Mallampati: II  TM Distance: >3 FB Neck ROM: Full    Dental no notable dental hx.    Pulmonary former smoker,    Pulmonary exam normal breath sounds clear to auscultation       Cardiovascular Exercise Tolerance: Good hypertension, Normal cardiovascular exam Rhythm:Regular Rate:Normal     Neuro/Psych    GI/Hepatic negative GI ROS, Neg liver ROS,   Endo/Other  diabetes, Gestational  Renal/GU   negative genitourinary   Musculoskeletal negative musculoskeletal ROS (+)   Abdominal   Peds  Hematology negative hematology ROS (+)   Anesthesia Other Findings   Reproductive/Obstetrics (+) Pregnancy                             Anesthesia Physical Anesthesia Plan  ASA: 3  Anesthesia Plan: Spinal   Post-op Pain Management:    Induction:   PONV Risk Score and Plan: 2 and Treatment may vary due to age or medical condition  Airway Management Planned: Natural Airway  Additional Equipment: None  Intra-op Plan:   Post-operative Plan:   Informed Consent: I have reviewed the patients History and Physical, chart, labs and discussed the procedure including the risks, benefits and alternatives for the proposed anesthesia with the patient or authorized representative who has indicated his/her understanding and acceptance.     Dental advisory given  Plan Discussed with: CRNA and Anesthesiologist  Anesthesia Plan Comments: (Spinal. GETA as backup. Norton Blizzard, MD  )        Anesthesia Quick Evaluation

## 2021-07-25 NOTE — OR Nursing (Signed)
FHR 135 per [place] with Dr. Rip Harbour

## 2021-07-26 ENCOUNTER — Telehealth: Payer: Self-pay

## 2021-07-26 DIAGNOSIS — N883 Incompetence of cervix uteri: Secondary | ICD-10-CM

## 2021-07-26 NOTE — Telephone Encounter (Addendum)
-----   Message from Chancy Milroy, MD sent at 07/25/2021  1:44 PM EDT ----- Please ordered limited OB U/S for cervical length in 2 weeks  Thanks Legrand Como    [place] scheduled with MFM for 08/08/21 at 3:30 PM. Called pt with interpreter Eda; VM left with new appt details. MyChart message sent with translation by Eda included.

## 2021-08-08 ENCOUNTER — Other Ambulatory Visit: Payer: Self-pay

## 2021-08-08 ENCOUNTER — Other Ambulatory Visit: Payer: Self-pay | Admitting: Obstetrics and Gynecology

## 2021-08-08 ENCOUNTER — Ambulatory Visit: Payer: Self-pay | Admitting: *Deleted

## 2021-08-08 ENCOUNTER — Ambulatory Visit: Payer: Self-pay | Attending: Obstetrics and Gynecology

## 2021-08-08 ENCOUNTER — Encounter: Payer: Self-pay | Admitting: *Deleted

## 2021-08-08 VITALS — BP 129/64 | HR 67

## 2021-08-08 DIAGNOSIS — O24419 Gestational diabetes mellitus in pregnancy, unspecified control: Secondary | ICD-10-CM

## 2021-08-08 DIAGNOSIS — O4442 Low lying placenta NOS or without hemorrhage, second trimester: Secondary | ICD-10-CM

## 2021-08-08 DIAGNOSIS — O3432 Maternal care for cervical incompetence, second trimester: Secondary | ICD-10-CM

## 2021-08-08 DIAGNOSIS — O10012 Pre-existing essential hypertension complicating pregnancy, second trimester: Secondary | ICD-10-CM

## 2021-08-08 DIAGNOSIS — O09212 Supervision of pregnancy with history of pre-term labor, second trimester: Secondary | ICD-10-CM

## 2021-08-08 DIAGNOSIS — N883 Incompetence of cervix uteri: Secondary | ICD-10-CM

## 2021-08-08 DIAGNOSIS — O0990 Supervision of high risk pregnancy, unspecified, unspecified trimester: Secondary | ICD-10-CM

## 2021-08-08 DIAGNOSIS — Z3A17 17 weeks gestation of pregnancy: Secondary | ICD-10-CM

## 2021-08-08 DIAGNOSIS — O09292 Supervision of pregnancy with other poor reproductive or obstetric history, second trimester: Secondary | ICD-10-CM

## 2021-08-14 ENCOUNTER — Other Ambulatory Visit: Payer: Self-pay

## 2021-08-14 ENCOUNTER — Ambulatory Visit: Payer: Self-pay

## 2021-08-23 ENCOUNTER — Other Ambulatory Visit: Payer: Self-pay

## 2021-08-23 ENCOUNTER — Ambulatory Visit (INDEPENDENT_AMBULATORY_CARE_PROVIDER_SITE_OTHER): Payer: Self-pay | Admitting: Family Medicine

## 2021-08-23 ENCOUNTER — Encounter: Payer: Self-pay | Admitting: Family Medicine

## 2021-08-23 VITALS — BP 138/84 | HR 80

## 2021-08-23 DIAGNOSIS — I10 Essential (primary) hypertension: Secondary | ICD-10-CM

## 2021-08-23 DIAGNOSIS — Z98891 History of uterine scar from previous surgery: Secondary | ICD-10-CM

## 2021-08-23 DIAGNOSIS — O0990 Supervision of high risk pregnancy, unspecified, unspecified trimester: Secondary | ICD-10-CM

## 2021-08-23 DIAGNOSIS — Z8759 Personal history of other complications of pregnancy, childbirth and the puerperium: Secondary | ICD-10-CM

## 2021-08-23 DIAGNOSIS — N883 Incompetence of cervix uteri: Secondary | ICD-10-CM

## 2021-08-23 DIAGNOSIS — O4440 Low lying placenta NOS or without hemorrhage, unspecified trimester: Secondary | ICD-10-CM | POA: Insufficient documentation

## 2021-08-23 DIAGNOSIS — Z789 Other specified health status: Secondary | ICD-10-CM

## 2021-08-23 DIAGNOSIS — O24419 Gestational diabetes mellitus in pregnancy, unspecified control: Secondary | ICD-10-CM

## 2021-08-23 MED ORDER — AMLODIPINE BESYLATE 10 MG PO TABS: 10.0000 mg | ORAL_TABLET | Freq: Every day | ORAL | 3 refills | Status: DC

## 2021-08-23 NOTE — Progress Notes (Signed)
   Subjective:  Bonnie Morales is a 40 y.o. [phone] at [redacted]w[redacted]d being seen today for ongoing prenatal care.  She is currently monitored for the following issues for this high-risk pregnancy and has History of VBAC; History of eclampsia; Language barrier; Chronic hypertension; History of cervical incompetence; Supervision of high risk pregnancy, antepartum; GDM (gestational diabetes mellitus); and Incompetence of cervix on their problem list.  Patient reports no complaints.  Contractions: Not present. Vag. Bleeding: None.  Movement: Present. Denies leaking of fluid.   The following portions of the patient's history were reviewed and updated as appropriate: allergies, current medications, past family history, past medical history, past social history, past surgical history and problem list. Problem list updated.  Objective:   Vitals:   08/23/21 1047  BP: 138/84  Pulse: 80    Fetal Status: Fetal Heart Rate (bpm): 149   Movement: Present     General:  Alert, oriented and cooperative. Patient is in no acute distress.  Skin: Skin is warm and dry. No rash noted.   Cardiovascular: Normal heart rate noted  Respiratory: Normal respiratory effort, no problems with respiration noted  Abdomen: Soft, gravid, appropriate for gestational age. Pain/Pressure: Absent     Pelvic: Vag. Bleeding: None     Cervical exam deferred        Extremities: Normal range of motion.  Edema: None  Mental Status: Normal mood and affect. Normal behavior. Normal judgment and thought content.   Urinalysis:      Assessment and Plan:  Pregnancy: [id number] at [redacted]w[redacted]d  1. Supervision of high risk pregnancy, antepartum BP and FHR normal AFP today Has anatomy scan scheduled for later this month - AFP, Serum, Open Spina Bifida  2. Chronic hypertension Stable on 10mg  Amlodipine  3. Gestational diabetes mellitus (GDM) in second trimester, gestational diabetes method of control unspecified Forgot log From recall reports  fastings are usually in the 80's 2hr post prandials are usually in the 90-110's  4. Incompetence of cervix S/p cerclage placement 07/25/21 with Dr. Rip Harbour Taking nightly prometrium  5. Language barrier Spanish  6. History of VBAC CS x1>VBAC x2, desires TOLAC  7. History of eclampsia On ASA  8. Low lying placenta 1.7cm from os on most recent scan, following w MFM  Preterm labor symptoms and general obstetric precautions including but not limited to vaginal bleeding, contractions, leaking of fluid and fetal movement were reviewed in detail with the patient. Please refer to After Visit Summary for other counseling recommendations.  Return in 4 weeks (on 09/20/2021) for [facility], ob visit, needs MD.   Clarnce Flock, MD

## 2021-08-25 LAB — AFP, SERUM, OPEN SPINA BIFIDA
AFP MoM: 0.96
AFP Value: 42.7 ng/mL
Gest. Age on Collection Date: 19.2 weeks
Maternal Age At EDD: 39.3 yr
OSBR Risk 1 IN: 10000
Test Results:: NEGATIVE
Weight: 202 [lb_av]

## 2021-08-29 ENCOUNTER — Ambulatory Visit: Payer: Self-pay

## 2021-08-29 ENCOUNTER — Encounter: Payer: Self-pay | Admitting: *Deleted

## 2021-08-29 ENCOUNTER — Ambulatory Visit: Payer: Self-pay | Attending: Family Medicine

## 2021-08-29 ENCOUNTER — Other Ambulatory Visit: Payer: Self-pay

## 2021-08-29 ENCOUNTER — Ambulatory Visit: Payer: Self-pay | Admitting: *Deleted

## 2021-08-29 VITALS — BP 122/59 | HR 58

## 2021-08-29 DIAGNOSIS — O0990 Supervision of high risk pregnancy, unspecified, unspecified trimester: Secondary | ICD-10-CM

## 2021-08-30 ENCOUNTER — Other Ambulatory Visit: Payer: Self-pay | Admitting: *Deleted

## 2021-08-30 DIAGNOSIS — Z6834 Body mass index (BMI) 34.0-34.9, adult: Secondary | ICD-10-CM

## 2021-08-30 DIAGNOSIS — O10912 Unspecified pre-existing hypertension complicating pregnancy, second trimester: Secondary | ICD-10-CM

## 2021-08-30 DIAGNOSIS — O4440 Low lying placenta NOS or without hemorrhage, unspecified trimester: Secondary | ICD-10-CM

## 2021-08-30 DIAGNOSIS — O24419 Gestational diabetes mellitus in pregnancy, unspecified control: Secondary | ICD-10-CM

## 2021-08-30 DIAGNOSIS — O3432 Maternal care for cervical incompetence, second trimester: Secondary | ICD-10-CM

## 2021-09-01 ENCOUNTER — Telehealth: Payer: Self-pay | Admitting: Radiology

## 2021-09-01 NOTE — Telephone Encounter (Signed)
Called patient and informed her of appt with Dr Higinio Plan to discuss sugar log on 09/05/21 @ 3:15

## 2021-09-05 ENCOUNTER — Other Ambulatory Visit: Payer: Self-pay

## 2021-09-05 ENCOUNTER — Encounter: Payer: Self-pay | Admitting: Family Medicine

## 2021-09-05 ENCOUNTER — Ambulatory Visit (INDEPENDENT_AMBULATORY_CARE_PROVIDER_SITE_OTHER): Payer: Self-pay | Admitting: Family Medicine

## 2021-09-05 VITALS — BP 127/74 | HR 73 | Wt 203.9 lb

## 2021-09-05 DIAGNOSIS — O0990 Supervision of high risk pregnancy, unspecified, unspecified trimester: Secondary | ICD-10-CM

## 2021-09-05 DIAGNOSIS — Z98891 History of uterine scar from previous surgery: Secondary | ICD-10-CM

## 2021-09-05 DIAGNOSIS — I10 Essential (primary) hypertension: Secondary | ICD-10-CM

## 2021-09-05 DIAGNOSIS — Z3A21 21 weeks gestation of pregnancy: Secondary | ICD-10-CM

## 2021-09-05 DIAGNOSIS — O24415 Gestational diabetes mellitus in pregnancy, controlled by oral hypoglycemic drugs: Secondary | ICD-10-CM

## 2021-09-05 DIAGNOSIS — Z8742 Personal history of other diseases of the female genital tract: Secondary | ICD-10-CM

## 2021-09-05 MED ORDER — METFORMIN HCL 500 MG PO TABS: 500.0000 mg | ORAL_TABLET | Freq: Two times a day (BID) | ORAL | 0 refills | Status: DC

## 2021-09-05 NOTE — Progress Notes (Signed)
Subjective:  Bonnie Morales is a 40 y.o. [phone] at 60w1dbeing seen today for ongoing prenatal care.  She is currently monitored for the following issues for this high-risk pregnancy and has History of VBAC; History of eclampsia; Language barrier; Chronic hypertension; History of cervical incompetence; Supervision of high risk pregnancy, antepartum; GDM (gestational diabetes mellitus); Incompetence of cervix; and Low-lying placenta on their problem list.  Patient reports concern for her CBGs. They have been rising in the last 1-2 weeks. Log showing fasting around 100-110, 2 hours postprandial 120-130s. See picture below. She has been following a well balanced diet, already met with dietician. Otherwise doing well.  Contractions: Not present. Vag. Bleeding: None.  Movement: Present. Denies leaking of fluid.     The following portions of the patient's history were reviewed and updated as appropriate: allergies, current medications, past family history, past medical history, past social history, past surgical history and problem list.   Objective:   Vitals:   09/05/21 1544  BP: 127/74  Pulse: 73  Weight: 203 lb 14.4 oz (92.5 kg)    Fetal Status: Fetal Heart Rate (bpm): 152 Fundal Height: 21 cm Movement: Present     General:  Alert, oriented and cooperative. Patient is in no acute distress.  Skin: Skin is warm and dry. No rash noted.   Cardiovascular: Normal heart rate noted  Respiratory: Normal respiratory effort, no problems with respiration noted  Abdomen: Soft, gravid, appropriate for gestational age. Pain/Pressure: Present     Pelvic:  Cervical exam deferred        Extremities: Normal range of motion.  Edema: None  Mental Status: Normal mood and affect. Normal behavior. Normal judgment and thought content.   Urinalysis:      Assessment and Plan:  Pregnancy: [id number] 279w1d1. Supervision of high risk pregnancy, antepartum Overall doing well.   2. [redacted] weeks gestation  of pregnancy Recent anatomy scan reassuring. F/u scheduled for 11/16.   3. Gestational diabetes mellitus (GDM) in second trimester controlled on oral hypoglycemic drug Diagnosed with 12 week GTT, may have been pre-existing. Several values fasting and postprandial above goal. Will start metformin twice daily, discussed common SE. Cont monitoring CBGs and ASA. Return in 2 weeks to check in CBHalseydiscuss fetal echo at that time.  - metFORMIN (GLUCOPHAGE) 500 MG tablet; Take 1 tablet (500 mg total) by mouth 2 (two) times daily with a meal. Start with one at night for 3-5 days.  Dispense: 60 tablet; Refill: 0  4. Chronic hypertension Well controlled at home, <130/80. Normal today. Cont norvasc 55m72m  5. History of cervical incompetence Prophylactic cerclage placed 07/25/2021. Cervix appeared appropriate length on recent [place] prometrium.  6. History of VBAC History of c-section d/t placenta previa, two successful VBACs afterwards. Desires TOLAC, will cont to watch placental location as discussed below. Consent to be signed in future visit.   7. Low lying placenta  Posterior, 1.7cm from os. Cont to monitor on f/u [place] 8. Language barrier In person spanish interpreter used.   Preterm labor symptoms and general obstetric precautions including but not limited to vaginal bleeding, contractions, leaking of fluid and fetal movement were reviewed in detail with the patient. Please refer to After Visit Summary for other counseling recommendations.   Return in about 2 weeks (around 09/19/2021) for check in CBGDongola  BeaPatriciaann ClanO

## 2021-09-05 NOTE — Progress Notes (Signed)
Patient stated that she worried about her blood sugar readings, she also complains of lower back pain at night

## 2021-09-19 ENCOUNTER — Other Ambulatory Visit: Payer: Self-pay

## 2021-09-19 ENCOUNTER — Ambulatory Visit (INDEPENDENT_AMBULATORY_CARE_PROVIDER_SITE_OTHER): Payer: Self-pay | Admitting: Family Medicine

## 2021-09-19 ENCOUNTER — Encounter: Payer: Self-pay | Admitting: Family Medicine

## 2021-09-19 VITALS — BP 138/78 | HR 73 | Wt 202.5 lb

## 2021-09-19 DIAGNOSIS — O0990 Supervision of high risk pregnancy, unspecified, unspecified trimester: Secondary | ICD-10-CM

## 2021-09-19 DIAGNOSIS — Z8759 Personal history of other complications of pregnancy, childbirth and the puerperium: Secondary | ICD-10-CM

## 2021-09-19 DIAGNOSIS — O4440 Low lying placenta NOS or without hemorrhage, unspecified trimester: Secondary | ICD-10-CM

## 2021-09-19 DIAGNOSIS — I10 Essential (primary) hypertension: Secondary | ICD-10-CM

## 2021-09-19 DIAGNOSIS — O24415 Gestational diabetes mellitus in pregnancy, controlled by oral hypoglycemic drugs: Secondary | ICD-10-CM

## 2021-09-19 DIAGNOSIS — Z98891 History of uterine scar from previous surgery: Secondary | ICD-10-CM

## 2021-09-19 DIAGNOSIS — N883 Incompetence of cervix uteri: Secondary | ICD-10-CM

## 2021-09-19 DIAGNOSIS — Z789 Other specified health status: Secondary | ICD-10-CM

## 2021-09-19 MED ORDER — AMLODIPINE BESYLATE 10 MG PO TABS: 10.0000 mg | ORAL_TABLET | Freq: Every day | ORAL | 3 refills | Status: DC

## 2021-09-19 MED ORDER — CYCLOBENZAPRINE HCL 5 MG PO TABS: 5.0000 mg | ORAL_TABLET | Freq: Three times a day (TID) | ORAL | 2 refills | Status: DC | PRN

## 2021-09-19 NOTE — Progress Notes (Signed)
   Subjective:  Bonnie Morales is a 40 y.o. [phone] at [redacted]w[redacted]d being seen today for ongoing prenatal care.  She is currently monitored for the following issues for this high-risk pregnancy and has History of VBAC; History of eclampsia; Language barrier; Chronic hypertension; History of cervical incompetence; Supervision of high risk pregnancy, antepartum; GDM (gestational diabetes mellitus); Incompetence of cervix; and Low-lying placenta on their problem list.  Patient reports no complaints.  Contractions: Not present. Vag. Bleeding: None.  Movement: Present. Denies leaking of fluid.   The following portions of the patient's history were reviewed and updated as appropriate: allergies, current medications, past family history, past medical history, past social history, past surgical history and problem list. Problem list updated.  Objective:   Vitals:   09/19/21 0841  BP: 138/78  Pulse: 73  Weight: 202 lb 8 oz (91.9 kg)    Fetal Status: Fetal Heart Rate (bpm): 159   Movement: Present     General:  Alert, oriented and cooperative. Patient is in no acute distress.  Skin: Skin is warm and dry. No rash noted.   Cardiovascular: Normal heart rate noted  Respiratory: Normal respiratory effort, no problems with respiration noted  Abdomen: Soft, gravid, appropriate for gestational age. Pain/Pressure: Present     Pelvic: Vag. Bleeding: None     Cervical exam deferred        Extremities: Normal range of motion.  Edema: Trace  Mental Status: Normal mood and affect. Normal behavior. Normal judgment and thought content.   Urinalysis:        Assessment and Plan:  Pregnancy: [id number] at [redacted]w[redacted]d  1. Supervision of high risk pregnancy, antepartum BP and FHR normal Reports headache for several days, has not taken anything, associated with muscle pain in neck, BP normal, trial flexeril  2. Gestational diabetes mellitus (GDM) in second trimester controlled on oral hypoglycemic drug Started on  metformin last visit, reports she has actually only been taking 500mg  qAM Log reviewed, post prandials are controlled but fastings are not, which is not surprising She agrees to start taking qPM dose Following w MFM, antenatal testing at 32 weeks  3. Chronic hypertension Stable on Amlodipine 10mg   4. Incompetence of cervix S/p cerclage placement 07/25/21 w Dr. Rip Harbour Taking nightly prometrium  5. Low-lying placenta 1.7>1.9 cm on most recent scan, MFM following  6. Language barrier Spanish  7. History of VBAC CS x1>VBAC x2, desires TOLAC Sign consent next visit  8. History of eclampsia On ASA  Preterm labor symptoms and general obstetric precautions including but not limited to vaginal bleeding, contractions, leaking of fluid and fetal movement were reviewed in detail with the patient. Please refer to After Visit Summary for other counseling recommendations.  Return in about 4 weeks (around 10/17/2021) for [facility], ob visit, 28 wk labs.   Clarnce Flock, MD

## 2021-09-27 ENCOUNTER — Encounter: Payer: Self-pay | Admitting: *Deleted

## 2021-09-27 ENCOUNTER — Other Ambulatory Visit: Payer: Self-pay | Admitting: *Deleted

## 2021-09-27 ENCOUNTER — Ambulatory Visit: Payer: Self-pay | Attending: Obstetrics and Gynecology

## 2021-09-27 ENCOUNTER — Other Ambulatory Visit: Payer: Self-pay

## 2021-09-27 ENCOUNTER — Ambulatory Visit: Payer: Self-pay | Admitting: *Deleted

## 2021-09-27 VITALS — BP 124/71 | HR 74

## 2021-09-27 DIAGNOSIS — E669 Obesity, unspecified: Secondary | ICD-10-CM

## 2021-09-27 DIAGNOSIS — O4440 Low lying placenta NOS or without hemorrhage, unspecified trimester: Secondary | ICD-10-CM | POA: Insufficient documentation

## 2021-09-27 DIAGNOSIS — O24419 Gestational diabetes mellitus in pregnancy, unspecified control: Secondary | ICD-10-CM | POA: Insufficient documentation

## 2021-09-27 DIAGNOSIS — O10012 Pre-existing essential hypertension complicating pregnancy, second trimester: Secondary | ICD-10-CM

## 2021-09-27 DIAGNOSIS — O3432 Maternal care for cervical incompetence, second trimester: Secondary | ICD-10-CM

## 2021-09-27 DIAGNOSIS — O10912 Unspecified pre-existing hypertension complicating pregnancy, second trimester: Secondary | ICD-10-CM | POA: Insufficient documentation

## 2021-09-27 DIAGNOSIS — O0990 Supervision of high risk pregnancy, unspecified, unspecified trimester: Secondary | ICD-10-CM | POA: Insufficient documentation

## 2021-09-27 DIAGNOSIS — Z6834 Body mass index (BMI) 34.0-34.9, adult: Secondary | ICD-10-CM | POA: Insufficient documentation

## 2021-09-27 DIAGNOSIS — O4442 Low lying placenta NOS or without hemorrhage, second trimester: Secondary | ICD-10-CM

## 2021-09-27 DIAGNOSIS — O99212 Obesity complicating pregnancy, second trimester: Secondary | ICD-10-CM

## 2021-09-27 DIAGNOSIS — O34219 Maternal care for unspecified type scar from previous cesarean delivery: Secondary | ICD-10-CM

## 2021-09-27 DIAGNOSIS — Z3A24 24 weeks gestation of pregnancy: Secondary | ICD-10-CM

## 2021-10-16 ENCOUNTER — Other Ambulatory Visit: Payer: Self-pay | Admitting: *Deleted

## 2021-10-16 DIAGNOSIS — O0990 Supervision of high risk pregnancy, unspecified, unspecified trimester: Secondary | ICD-10-CM

## 2021-10-19 ENCOUNTER — Other Ambulatory Visit: Payer: Self-pay

## 2021-10-19 ENCOUNTER — Ambulatory Visit (INDEPENDENT_AMBULATORY_CARE_PROVIDER_SITE_OTHER): Payer: Self-pay | Admitting: Obstetrics & Gynecology

## 2021-10-19 VITALS — BP 108/76 | HR 70 | Wt 200.0 lb

## 2021-10-19 DIAGNOSIS — Z98891 History of uterine scar from previous surgery: Secondary | ICD-10-CM

## 2021-10-19 DIAGNOSIS — O24415 Gestational diabetes mellitus in pregnancy, controlled by oral hypoglycemic drugs: Secondary | ICD-10-CM

## 2021-10-19 DIAGNOSIS — I10 Essential (primary) hypertension: Secondary | ICD-10-CM

## 2021-10-19 DIAGNOSIS — Z789 Other specified health status: Secondary | ICD-10-CM

## 2021-10-19 DIAGNOSIS — N883 Incompetence of cervix uteri: Secondary | ICD-10-CM

## 2021-10-19 DIAGNOSIS — O0990 Supervision of high risk pregnancy, unspecified, unspecified trimester: Secondary | ICD-10-CM

## 2021-10-19 NOTE — Progress Notes (Signed)
   PRENATAL VISIT NOTE  Subjective:  Bonnie Morales is a 40 y.o. [phone] at [redacted]w[redacted]d being seen today for ongoing prenatal care.  She is currently monitored for the following issues for this high-risk pregnancy and has History of VBAC; History of eclampsia; Language barrier; Chronic hypertension; History of cervical incompetence; Supervision of high risk pregnancy, antepartum; GDM (gestational diabetes mellitus); Incompetence of cervix; and Low-lying placenta on their problem list.  Patient reports pressure and stretching.  Contractions: Not present. Vag. Bleeding: None.  Movement: Present. Denies leaking of fluid.   The following portions of the patient's history were reviewed and updated as appropriate: allergies, current medications, past family history, past medical history, past social history, past surgical history and problem list.   Objective:   Vitals:   10/19/21 0846  BP: 108/76  Pulse: 70  Weight: 200 lb (90.7 kg)    Fetal Status: Fetal Heart Rate (bpm): 164   Movement: Present     General:  Alert, oriented and cooperative. Patient is in no acute distress.  Skin: Skin is warm and dry. No rash noted.   Cardiovascular: Normal heart rate noted  Respiratory: Normal respiratory effort, no problems with respiration noted  Abdomen: Soft, gravid, appropriate for gestational age.  Pain/Pressure: Absent     Pelvic: Cervical exam deferred        Extremities: Normal range of motion.  Edema: None  Mental Status: Normal mood and affect. Normal behavior. Normal judgment and thought content.   Assessment and Plan:  Pregnancy: [id number] at [redacted]w[redacted]d 1. Incompetence of cervix cerclage  2. Gestational diabetes mellitus (GDM) in second trimester controlled on oral hypoglycemic drug FBS and PP in range  3. Language barrier Spanish   4. Supervision of high risk pregnancy, antepartum   5. Chronic hypertension Norvac  6. History of VBAC TOLAC  Preterm labor symptoms and general  obstetric precautions including but not limited to vaginal bleeding, contractions, leaking of fluid and fetal movement were reviewed in detail with the patient. Please refer to After Visit Summary for other counseling recommendations.   Return in about 3 weeks (around 11/09/2021).  Future Appointments  Date Time Provider [place]  10/19/2021  9:30 AM [facility]  10/25/2021  9:15 AM [facility] NURSE [facility]  10/25/2021  9:30 AM [facility] US3 [facility]  11/22/2021  9:15 AM [facility] NURSE [facility]  11/22/2021  9:30 AM [facility] US3 [facility]    Emeterio Reeve, MD

## 2021-10-20 LAB — CBC
Hematocrit: 35.6 % (ref 34.0–46.6)
Hemoglobin: 11.9 g/dL (ref 11.1–15.9)
MCH: 29.2 pg (ref 26.6–33.0)
MCHC: 33.4 g/dL (ref 31.5–35.7)
MCV: 88 fL (ref 79–97)
Platelets: 258 10*3/uL (ref 150–450)
RBC: 4.07 x10E6/uL (ref 3.77–5.28)
RDW: 13.2 % (ref 11.7–15.4)
WBC: 4.5 10*3/uL (ref 3.4–10.8)

## 2021-10-20 LAB — RPR: RPR Ser Ql: NONREACTIVE

## 2021-10-20 LAB — HIV ANTIBODY (ROUTINE TESTING W REFLEX): HIV Screen 4th Generation wRfx: NONREACTIVE

## 2021-10-25 ENCOUNTER — Other Ambulatory Visit: Payer: Self-pay

## 2021-10-25 ENCOUNTER — Other Ambulatory Visit: Payer: Self-pay | Admitting: *Deleted

## 2021-10-25 ENCOUNTER — Ambulatory Visit: Payer: Self-pay | Attending: Maternal & Fetal Medicine

## 2021-10-25 ENCOUNTER — Encounter: Payer: Self-pay | Admitting: *Deleted

## 2021-10-25 ENCOUNTER — Ambulatory Visit: Payer: Self-pay | Admitting: *Deleted

## 2021-10-25 VITALS — BP 123/67 | HR 67

## 2021-10-25 DIAGNOSIS — O10013 Pre-existing essential hypertension complicating pregnancy, third trimester: Secondary | ICD-10-CM

## 2021-10-25 DIAGNOSIS — E669 Obesity, unspecified: Secondary | ICD-10-CM

## 2021-10-25 DIAGNOSIS — O10913 Unspecified pre-existing hypertension complicating pregnancy, third trimester: Secondary | ICD-10-CM

## 2021-10-25 DIAGNOSIS — O0990 Supervision of high risk pregnancy, unspecified, unspecified trimester: Secondary | ICD-10-CM | POA: Insufficient documentation

## 2021-10-25 DIAGNOSIS — O10912 Unspecified pre-existing hypertension complicating pregnancy, second trimester: Secondary | ICD-10-CM

## 2021-10-25 DIAGNOSIS — O3433 Maternal care for cervical incompetence, third trimester: Secondary | ICD-10-CM

## 2021-10-25 DIAGNOSIS — O24419 Gestational diabetes mellitus in pregnancy, unspecified control: Secondary | ICD-10-CM

## 2021-10-25 DIAGNOSIS — O3432 Maternal care for cervical incompetence, second trimester: Secondary | ICD-10-CM

## 2021-10-25 DIAGNOSIS — O24913 Unspecified diabetes mellitus in pregnancy, third trimester: Secondary | ICD-10-CM

## 2021-10-25 DIAGNOSIS — Z3A28 28 weeks gestation of pregnancy: Secondary | ICD-10-CM

## 2021-10-25 DIAGNOSIS — O34219 Maternal care for unspecified type scar from previous cesarean delivery: Secondary | ICD-10-CM

## 2021-10-25 DIAGNOSIS — O99213 Obesity complicating pregnancy, third trimester: Secondary | ICD-10-CM

## 2021-10-25 DIAGNOSIS — O09293 Supervision of pregnancy with other poor reproductive or obstetric history, third trimester: Secondary | ICD-10-CM

## 2021-11-03 ENCOUNTER — Encounter: Payer: Self-pay | Admitting: Family Medicine

## 2021-11-09 ENCOUNTER — Ambulatory Visit (INDEPENDENT_AMBULATORY_CARE_PROVIDER_SITE_OTHER): Payer: Self-pay | Admitting: Obstetrics & Gynecology

## 2021-11-09 ENCOUNTER — Other Ambulatory Visit: Payer: Self-pay

## 2021-11-09 VITALS — BP 126/64 | HR 62 | Wt 202.8 lb

## 2021-11-09 DIAGNOSIS — Z98891 History of uterine scar from previous surgery: Secondary | ICD-10-CM

## 2021-11-09 DIAGNOSIS — N883 Incompetence of cervix uteri: Secondary | ICD-10-CM

## 2021-11-09 DIAGNOSIS — I10 Essential (primary) hypertension: Secondary | ICD-10-CM

## 2021-11-09 DIAGNOSIS — O0990 Supervision of high risk pregnancy, unspecified, unspecified trimester: Secondary | ICD-10-CM

## 2021-11-09 DIAGNOSIS — O24415 Gestational diabetes mellitus in pregnancy, controlled by oral hypoglycemic drugs: Secondary | ICD-10-CM

## 2021-11-09 MED ORDER — LORATADINE 10 MG PO TABS: 10.0000 mg | ORAL_TABLET | Freq: Every day | ORAL | 1 refills | Status: DC

## 2021-11-09 NOTE — Progress Notes (Signed)
° °  PRENATAL VISIT NOTE  Subjective:  Natalin Shayma Pfefferle is a 40 y.o. [phone] at [redacted]w[redacted]d being seen today for ongoing prenatal care.  She is currently monitored for the following issues for this high-risk pregnancy and has History of VBAC; History of eclampsia; Language barrier; Chronic hypertension; History of cervical incompetence; Supervision of high risk pregnancy, antepartum; GDM (gestational diabetes mellitus); and Incompetence of cervix on their problem list.  Patient reports  pressure and pain both ears .  Contractions: Irritability. Vag. Bleeding: None.  Movement: Present. Denies leaking of fluid.   The following portions of the patient's history were reviewed and updated as appropriate: allergies, current medications, past family history, past medical history, past social history, past surgical history and problem list.   Objective:   Vitals:   11/09/21 1427  BP: 126/64  Pulse: 62  Weight: 202 lb 12.8 oz (92 kg)    Fetal Status: Fetal Heart Rate (bpm): 140   Movement: Present     General:  Alert, oriented and cooperative. Patient is in no acute distress.  Skin: Skin is warm and dry. No rash noted.   Cardiovascular: Normal heart rate noted  Respiratory: Normal respiratory effort, no problems with respiration noted  Abdomen: Soft, gravid, appropriate for gestational age.  Pain/Pressure: Present     Pelvic: Cervical exam deferred        Extremities: Normal range of motion.  Edema: None  Mental Status: Normal mood and affect. Normal behavior. Normal judgment and thought content.   Assessment and Plan:  Pregnancy: [id number] at [redacted]w[redacted]d 1. Supervision of high risk pregnancy, antepartum Possible allergy causing ear pain and pressure - loratadine (CLARITIN) 10 MG tablet; Take 1 tablet (10 mg total) by mouth daily.  Dispense: 20 tablet; Refill: 1  2. Incompetence of cervix Remove after 36 weeks  3. Gestational diabetes mellitus (GDM) in second trimester controlled on oral  hypoglycemic drug 75% fasting in range and normal growth  4. History of VBAC BP normal-on meds  Preterm labor symptoms and general obstetric precautions including but not limited to vaginal bleeding, contractions, leaking of fluid and fetal movement were reviewed in detail with the patient. Please refer to After Visit Summary for other counseling recommendations.   Return in about 2 weeks (around 11/23/2021).  Future Appointments  Date Time Provider [place]  11/22/2021  9:15 AM [facility] NURSE [facility]  11/22/2021  9:30 AM [facility] US3 [facility]  11/29/2021  9:00 AM [facility] NURSE [facility]  11/29/2021  9:15 AM [facility] US2 [facility]    Emeterio Reeve, MD

## 2021-11-12 NOTE — L&D Delivery Note (Addendum)
OB/GYN Faculty Practice Delivery Note Bonnie Morales is a 41 y.o. [phone] s/p VBAC at [redacted]w[redacted]d. She was admitted for IOL d/t well controlled cHTN on Amlodipine and well controlled A2GDM on Metformin.   ROM: 3h 53m with clear fluid GBS Status: positive Maximum Maternal Temperature: 97.9  Labor Progress: Pt admitted for IOL, s/p Pitocin.  After AROM, pt progressed to complete.    Delivery Date/Time: 12/28/2021 @1356   Delivery: Called to room and patient was complete and pushing with good maternal effort. After a 11 minute 2nd stage, a viable, healthy, and NBF as delivered via  ROA over an intact perineum. No nuchal cord present. Shoulder and body delivered in usual fashion. Infant with spontaneous cry, placed on mother's abdomen, dried and stimulated. Cord clamped x 2 after 3 minute delay, and cut by FOB. Cord blood drawn. The placenta separated spontaneously and delivered via CCT and maternal pushing effort.  It was inspected and appears to be intact with a 3 VC. Fundus firm with massage and Pitocin. Labia, perineum, vagina, and cervix inspected inspected with no lacerations or abrasions.   Complications: None Lacerations: none EBL: 300 cc Analgesia: Epidural Infant: NBF: 2940g   APGARs 9   9  Postpartum Planning Mom to postpartum.  Baby to Couplet care / Skin to Skin. Routine postpartum care. cHTN: continue amlodipine as ordered.  BP's reviewed 130-140/60-80's.  Asymptomatic for preE.  A2GDM: fasting CBG's, hold metformin for now. Normal range CBG's.   Olga Coaster, SNM, ECU  The above was performed under my direct supervision and guidance.

## 2021-11-21 ENCOUNTER — Encounter (HOSPITAL_COMMUNITY): Payer: Self-pay | Admitting: Obstetrics & Gynecology

## 2021-11-21 ENCOUNTER — Inpatient Hospital Stay (HOSPITAL_COMMUNITY)
Admission: AD | Admit: 2021-11-21 | Discharge: 2021-11-21 | Disposition: A | Discharge status: Home / Self Care | Payer: Self-pay | Attending: Obstetrics & Gynecology | Admitting: Obstetrics & Gynecology

## 2021-11-21 ENCOUNTER — Other Ambulatory Visit: Payer: Self-pay

## 2021-11-21 DIAGNOSIS — O3433 Maternal care for cervical incompetence, third trimester: Secondary | ICD-10-CM | POA: Insufficient documentation

## 2021-11-21 DIAGNOSIS — Z3A32 32 weeks gestation of pregnancy: Secondary | ICD-10-CM | POA: Insufficient documentation

## 2021-11-21 DIAGNOSIS — N883 Incompetence of cervix uteri: Secondary | ICD-10-CM

## 2021-11-21 DIAGNOSIS — O0990 Supervision of high risk pregnancy, unspecified, unspecified trimester: Secondary | ICD-10-CM

## 2021-11-21 DIAGNOSIS — R109 Unspecified abdominal pain: Secondary | ICD-10-CM | POA: Insufficient documentation

## 2021-11-21 DIAGNOSIS — O26893 Other specified pregnancy related conditions, third trimester: Secondary | ICD-10-CM | POA: Insufficient documentation

## 2021-11-21 DIAGNOSIS — O09523 Supervision of elderly multigravida, third trimester: Secondary | ICD-10-CM | POA: Insufficient documentation

## 2021-11-21 LAB — URINALYSIS, ROUTINE W REFLEX MICROSCOPIC
Bilirubin Urine: NEGATIVE
Glucose, UA: NEGATIVE mg/dL
Ketones, ur: NEGATIVE mg/dL
Nitrite: NEGATIVE
Protein, ur: NEGATIVE mg/dL
Specific Gravity, Urine: 1.02 (ref 1.005–1.030)
pH: 6.5 (ref 5.0–8.0)

## 2021-11-21 LAB — URINALYSIS, MICROSCOPIC (REFLEX)

## 2021-11-21 NOTE — MAU Note (Signed)
Lower abdominal cramping at 6 am. No bleeding or leaking of fluid. Pain of 5/10

## 2021-11-21 NOTE — Discharge Instructions (Signed)
Regrese a MAU por ms de 6 contracciones dolorosas en 1 hora.

## 2021-11-21 NOTE — MAU Note (Signed)
Presents with lower abdominal pain that began last night, describes pain as intermittent now but was constant last night.  Denies VB or LOF.  Endorses +FM.

## 2021-11-21 NOTE — MAU Provider Note (Signed)
History     CSN: [id number]  Arrival date and time: 11/21/21 1122   Event Date/Time   First Provider Initiated Contact with Patient 11/21/21 1206      Chief Complaint  Patient presents with   Abdominal Pain   HPI Ms. Bonnie Morales is a 41 y.o. year old [id number] female at [redacted]w[redacted]d weeks gestation who presents to MAU reporting lower abdominal pain since last night. She reports the pain as intermittent now, but was constant last night. Her high risk pregnancy is complicated by incompetent cervix, cerclage in place since 07/25/2021, cHTN, A2/??B GDM, and low lying placenta (1.7 cm from os). She receives [facility] with [facility].  OB History     Gravida  4   Para  3   Term  2   Preterm  1   AB  0   Living  3      SAB  0   IAB  0   Ectopic  0   Multiple  0   Live Births  3           Past Medical History:  Diagnosis Date   Chronic hypertension    Gestational diabetes    History of kidney stones    passed stone   Hypertension    2014   Pre-eclampsia     Past Surgical History:  Procedure Laterality Date   CERVICAL CERCLAGE N/A 04/12/2017   Procedure: CERCLAGE CERVICAL;  Surgeon: Chancy Milroy, MD;  Location: [facility];  Service: Gynecology;  Laterality: N/A;   CERVICAL CERCLAGE N/A 07/25/2021   Procedure: CERCLAGE CERVICAL;  Surgeon: Chancy Milroy, MD;  Location: [facility];  Service: Gynecology;  Laterality: N/A;   CESAREAN SECTION     epidural - in [place]    Family History  Problem Relation Age of Onset   Diabetes Mother    Hypertension Mother    Heart disease Mother        tachycardia   Hypertension Father    Mental retardation Sister    Cancer Paternal Grandmother     Social History   Tobacco Use   Smoking status: Former    Packs/day: 0.25    Years: 5.00    Pack years: 1.25    Types: Cigarettes    Quit date: 09/12/2016    Years since quitting: 5.1   Smokeless tobacco: Never  Vaping Use   Vaping Use: Never used  Substance Use Topics    Alcohol use: No   Drug use: No    Allergies:  Allergies  Allergen Reactions   Latex     burning   Sulfa Antibiotics Hives    Reports high fever and red rash on arms    Medications Prior to Admission  Medication Sig Dispense Refill Last Dose   amLODipine (NORVASC) 10 MG tablet Take 1 tablet (10 mg total) by mouth daily. 90 tablet 3 11/21/2021 at 1000   aspirin 81 MG tablet Take 81 mg by mouth daily.   11/20/2021 at 1000   loratadine (CLARITIN) 10 MG tablet Take 1 tablet (10 mg total) by mouth daily. 20 tablet 1 Past Week   metFORMIN (GLUCOPHAGE) 500 MG tablet Take 1 tablet (500 mg total) by mouth 2 (two) times daily with a meal. Start with one at night for 3-5 days. 60 tablet 0 11/21/2021 at 1000   Prenatal Vit-Fe Fumarate-FA (PRENATAL MULTIVITAMIN) TABS tablet Take 1 tablet by mouth daily.   11/21/2021 at 1000   progesterone (PROMETRIUM) 200  MG capsule Place 1 capsule (200 mg total) vaginally daily. 30 capsule 8 Past Week at 2200   acetaminophen (TYLENOL) 500 MG tablet Take 500 mg by mouth every 6 (six) hours as needed for moderate pain.   Unknown    Review of Systems  Constitutional: Negative.   HENT: Negative.    Eyes: Negative.   Respiratory: Negative.    Cardiovascular: Negative.   Gastrointestinal: Negative.   Endocrine: Negative.   Genitourinary:  Positive for pelvic pain (cramping).  Musculoskeletal: Negative.   Skin: Negative.   Allergic/Immunologic: Negative.   Neurological: Negative.   Hematological: Negative.   Psychiatric/Behavioral: Negative.    Physical Exam   Blood pressure 115/68, pulse 76, temperature 98 F (36.7 C), temperature source Oral, resp. rate 18, weight 91.9 kg, last menstrual period 04/10/2021, SpO2 99 %, currently breastfeeding.  Physical Exam Vitals and nursing note reviewed. Exam conducted with a chaperone present.  Constitutional:      Appearance: Normal appearance. She is obese.  Genitourinary:    General: Normal vulva.     Comments:  Pelvic exam: External genitalia normal, SE: vaginal walls pink and well rugated, cervix is smooth, pink, no lesions, small  amt of thick, white vaginal d/c -- WP, GC/CT done, cervix visually closed and cerclage knot visualized at 10 o'clock position, Uterus is non-tender, S=D.  Musculoskeletal:        General: Normal range of motion.  Skin:    General: Skin is warm and dry.  Neurological:     Mental Status: She is alert and oriented to person, place, and time.  Psychiatric:        Mood and Affect: Mood normal.        Behavior: Behavior normal.        Thought Content: Thought content normal.        Judgment: Judgment normal.   REACTIVE NST - FHR: 145 bpm / moderate variability / accels present / decels absent / TOCO: none MAU Course  Procedures  MDM CCUA Results for orders placed or performed during the hospital encounter of 11/21/21 (from the past 72 hour(s))  Urinalysis, Routine w reflex microscopic Urine, Clean Catch     Status: Abnormal   Collection Time: 11/21/21 12:14 PM  Result Value Ref Range   Color, Urine YELLOW YELLOW   APPearance CLEAR CLEAR   Specific Gravity, Urine 1.020 1.005 - 1.030   pH 6.5 5.0 - 8.0   Glucose, UA NEGATIVE NEGATIVE mg/dL   Hgb urine dipstick SMALL (A) NEGATIVE   Bilirubin Urine NEGATIVE NEGATIVE   Ketones, ur NEGATIVE NEGATIVE mg/dL   Protein, ur NEGATIVE NEGATIVE mg/dL   Nitrite NEGATIVE NEGATIVE   Leukocytes,Ua MODERATE (A) NEGATIVE    Comment: Performed at [facility] [address], [place], [place] [address]  Urinalysis, Microscopic (reflex)     Status: Abnormal   Collection Time: 11/21/21 12:14 PM  Result Value Ref Range   RBC / HPF 6-10 0 - 5 RBC/hpf   WBC, UA 11-20 0 - 5 WBC/hpf   Bacteria, UA FEW (A) NONE SEEN   Squamous Epithelial / LPF 6-10 0 - 5   Mucus PRESENT     Comment: Performed at [facility], [place] [address] [place], [place] [address]   Assessment and Plan  Abdominal pain during pregnancy, third  trimester  - Information provided on PTL and preventing PTB  Incompetence of cervix - Cerclage remains in place  [redacted] weeks gestation of pregnancy   -  Discharge patient - Keep scheduled appt with [facility] on 11/27/2021 - Patient verbalized an understanding of the plan of care and agrees.   **[facility] [facility] visitor used for entire visit  Laury Deep, CNM 11/21/2021, 12:06 PM

## 2021-11-22 ENCOUNTER — Encounter: Payer: Self-pay | Admitting: *Deleted

## 2021-11-22 ENCOUNTER — Ambulatory Visit: Payer: Self-pay | Attending: Maternal & Fetal Medicine

## 2021-11-22 ENCOUNTER — Other Ambulatory Visit: Payer: Self-pay | Admitting: *Deleted

## 2021-11-22 ENCOUNTER — Ambulatory Visit: Payer: Self-pay | Admitting: *Deleted

## 2021-11-22 VITALS — BP 130/52 | HR 67

## 2021-11-22 DIAGNOSIS — O0990 Supervision of high risk pregnancy, unspecified, unspecified trimester: Secondary | ICD-10-CM | POA: Insufficient documentation

## 2021-11-22 DIAGNOSIS — O34219 Maternal care for unspecified type scar from previous cesarean delivery: Secondary | ICD-10-CM | POA: Insufficient documentation

## 2021-11-22 DIAGNOSIS — O09293 Supervision of pregnancy with other poor reproductive or obstetric history, third trimester: Secondary | ICD-10-CM

## 2021-11-22 DIAGNOSIS — O10913 Unspecified pre-existing hypertension complicating pregnancy, third trimester: Secondary | ICD-10-CM

## 2021-11-22 DIAGNOSIS — O24419 Gestational diabetes mellitus in pregnancy, unspecified control: Secondary | ICD-10-CM | POA: Insufficient documentation

## 2021-11-22 DIAGNOSIS — Z3A32 32 weeks gestation of pregnancy: Secondary | ICD-10-CM

## 2021-11-22 DIAGNOSIS — O10012 Pre-existing essential hypertension complicating pregnancy, second trimester: Secondary | ICD-10-CM

## 2021-11-22 DIAGNOSIS — O10912 Unspecified pre-existing hypertension complicating pregnancy, second trimester: Secondary | ICD-10-CM | POA: Insufficient documentation

## 2021-11-22 DIAGNOSIS — O3432 Maternal care for cervical incompetence, second trimester: Secondary | ICD-10-CM | POA: Insufficient documentation

## 2021-11-22 DIAGNOSIS — O3433 Maternal care for cervical incompetence, third trimester: Secondary | ICD-10-CM

## 2021-11-22 DIAGNOSIS — O24415 Gestational diabetes mellitus in pregnancy, controlled by oral hypoglycemic drugs: Secondary | ICD-10-CM

## 2021-11-22 DIAGNOSIS — O99213 Obesity complicating pregnancy, third trimester: Secondary | ICD-10-CM

## 2021-11-27 ENCOUNTER — Ambulatory Visit: Payer: Self-pay | Admitting: *Deleted

## 2021-11-27 ENCOUNTER — Ambulatory Visit (INDEPENDENT_AMBULATORY_CARE_PROVIDER_SITE_OTHER): Payer: Self-pay | Admitting: Obstetrics and Gynecology

## 2021-11-27 ENCOUNTER — Ambulatory Visit: Payer: Self-pay | Attending: Obstetrics and Gynecology

## 2021-11-27 ENCOUNTER — Other Ambulatory Visit: Payer: Self-pay

## 2021-11-27 VITALS — BP 126/63 | HR 63

## 2021-11-27 VITALS — BP 127/75 | HR 73 | Wt 203.2 lb

## 2021-11-27 DIAGNOSIS — Z3A33 33 weeks gestation of pregnancy: Secondary | ICD-10-CM

## 2021-11-27 DIAGNOSIS — O10913 Unspecified pre-existing hypertension complicating pregnancy, third trimester: Secondary | ICD-10-CM

## 2021-11-27 DIAGNOSIS — Z98891 History of uterine scar from previous surgery: Secondary | ICD-10-CM

## 2021-11-27 DIAGNOSIS — O24415 Gestational diabetes mellitus in pregnancy, controlled by oral hypoglycemic drugs: Secondary | ICD-10-CM

## 2021-11-27 DIAGNOSIS — O0990 Supervision of high risk pregnancy, unspecified, unspecified trimester: Secondary | ICD-10-CM

## 2021-11-27 DIAGNOSIS — O3433 Maternal care for cervical incompetence, third trimester: Secondary | ICD-10-CM | POA: Insufficient documentation

## 2021-11-27 DIAGNOSIS — O24913 Unspecified diabetes mellitus in pregnancy, third trimester: Secondary | ICD-10-CM

## 2021-11-27 DIAGNOSIS — O10013 Pre-existing essential hypertension complicating pregnancy, third trimester: Secondary | ICD-10-CM

## 2021-11-27 DIAGNOSIS — Z8759 Personal history of other complications of pregnancy, childbirth and the puerperium: Secondary | ICD-10-CM

## 2021-11-27 DIAGNOSIS — Z789 Other specified health status: Secondary | ICD-10-CM

## 2021-11-27 DIAGNOSIS — I10 Essential (primary) hypertension: Secondary | ICD-10-CM

## 2021-11-27 DIAGNOSIS — R300 Dysuria: Secondary | ICD-10-CM

## 2021-11-27 DIAGNOSIS — O09293 Supervision of pregnancy with other poor reproductive or obstetric history, third trimester: Secondary | ICD-10-CM

## 2021-11-27 LAB — POCT URINALYSIS DIP (DEVICE)
Bilirubin Urine: NEGATIVE
Glucose, UA: NEGATIVE mg/dL
Ketones, ur: NEGATIVE mg/dL
Nitrite: NEGATIVE
Protein, ur: NEGATIVE mg/dL
Specific Gravity, Urine: >=1.03 (ref 1.005–1.030)
Urobilinogen, UA: 4 mg/dL — ABNORMAL HIGH (ref 0.0–1.0)
pH: 7 (ref 5.0–8.0)

## 2021-11-27 NOTE — Progress Notes (Signed)
° ° ° °  PRENATAL VISIT NOTE  Subjective:  Bonnie Morales is a 41 y.o. [phone] at [redacted]w[redacted]d being seen today for ongoing prenatal care.  She is currently monitored for the following issues for this high-risk pregnancy and has History of VBAC; History of eclampsia; Language barrier; Chronic hypertension; History of cervical incompetence; Supervision of high risk pregnancy, antepartum; Oral hypoglycemic controlled White classification A2 gestational diabetes mellitus (GDM); Incompetence of cervix; and Cervical cerclage suture present in third trimester on their problem list.  Patient reports  dysuria .  Contractions: Not present. Vag. Bleeding: None.  Movement: Present. Denies leaking of fluid.   The following portions of the patient's history were reviewed and updated as appropriate: allergies, current medications, past family history, past medical history, past social history, past surgical history and problem list.   Objective:   Vitals:   11/27/21 1101  BP: 127/75  Pulse: 73  Weight: 203 lb 3.2 oz (92.2 kg)    Fetal Status: Fetal Heart Rate (bpm): 160   Movement: Present     General:  Alert, oriented and cooperative. Patient is in no acute distress.  Skin: Skin is warm and dry. No rash noted.   Cardiovascular: Normal heart rate noted  Respiratory: Normal respiratory effort, no problems with respiration noted  Abdomen: Soft, gravid, appropriate for gestational age.  Pain/Pressure: Present     Pelvic: Cervical exam deferred        Extremities: Normal range of motion.  Edema: None  Mental Status: Normal mood and affect. Normal behavior. Normal judgment and thought content.   Assessment and Plan:  Pregnancy: [id number] at [redacted]w[redacted]d 1. Dysuria U/a with heme and leuks. Await ucx results - Culture, OB Urine  2. Cervical cerclage suture present in third trimester D/w her re: removal at 36-37wks and to continue prometrium until then  3. History of eclampsia Continue low dose asa  4.  History of VBAC Desires vbac. Form signed with her today  5. Language barrier Interpreter used  6. Supervision of high risk pregnancy, antepartum Has mfm u/s later today. Continue qwk testing until delivery 1/11: 39%, 1938gm, ac 42%, afi 10.5, 8/8, 6-10cm fibroid, right sided, IM November u/s images show placental edge 3.5cm from cervix Pt desires btl; she has no insurance. I d/w her that can do if she needs a c/s for whatever reason  7. Chronic hypertension No issues on norvasc 10 qday  8. Oral hypoglycemic controlled White classification A2 gestational diabetes mellitus (GDM) Doing well on metformin bid  9. [redacted] weeks gestation of pregnancy  Preterm labor symptoms and general obstetric precautions including but not limited to vaginal bleeding, contractions, leaking of fluid and fetal movement were reviewed in detail with the patient. Please refer to After Visit Summary for other counseling recommendations.   No follow-ups on file.  Future Appointments  Date Time Provider [place]  11/27/2021  1:30 PM [facility] NURSE [facility]  11/27/2021  1:45 PM [facility] US4 [facility]  12/06/2021  8:15 AM [facility] NST [facility]  12/14/2021  9:55 AM Chancy Milroy, MD [facility]  12/14/2021 11:15 AM [facility] NST [facility]  12/20/2021  2:45 PM [facility] NURSE [facility]  12/20/2021  3:00 PM [facility] US1 [facility]    Aletha Halim, MD

## 2021-11-29 ENCOUNTER — Ambulatory Visit: Payer: Self-pay

## 2021-11-29 ENCOUNTER — Telehealth: Payer: Self-pay

## 2021-11-29 LAB — URINE CULTURE, OB REFLEX

## 2021-11-29 LAB — CULTURE, OB URINE

## 2021-11-29 NOTE — Telephone Encounter (Addendum)
-----   Message from Aletha Halim, MD sent at 11/29/2021 11:51 AM EST ----- Please let her know that her urine culture was normal so no UTI. Thanks  Called pt with Spanish Interpreter Eda R., and informed pt normal urine cx results.  Pt verbalized understanding with no further questions.    Mel Almond, RN  11/29/21

## 2021-12-06 ENCOUNTER — Ambulatory Visit (INDEPENDENT_AMBULATORY_CARE_PROVIDER_SITE_OTHER): Payer: Self-pay

## 2021-12-06 ENCOUNTER — Ambulatory Visit (INDEPENDENT_AMBULATORY_CARE_PROVIDER_SITE_OTHER): Payer: Self-pay | Admitting: General Practice

## 2021-12-06 ENCOUNTER — Other Ambulatory Visit: Payer: Self-pay

## 2021-12-06 VITALS — BP 121/76 | HR 83 | Wt 201.0 lb

## 2021-12-06 DIAGNOSIS — O0990 Supervision of high risk pregnancy, unspecified, unspecified trimester: Secondary | ICD-10-CM

## 2021-12-06 DIAGNOSIS — O24415 Gestational diabetes mellitus in pregnancy, controlled by oral hypoglycemic drugs: Secondary | ICD-10-CM

## 2021-12-06 DIAGNOSIS — O10919 Unspecified pre-existing hypertension complicating pregnancy, unspecified trimester: Secondary | ICD-10-CM

## 2021-12-06 NOTE — Progress Notes (Signed)
Pt informed that the ultrasound is considered a limited OB ultrasound and is not intended to be a complete ultrasound exam.  Patient also informed that the ultrasound is not being completed with the intent of assessing for fetal or placental anomalies or any pelvic abnormalities.  Explained that the purpose of today’s ultrasound is to assess for  BPP, presentation, and AFI.  Patient acknowledges the purpose of the exam and the limitations of the study.    ° °Jeffrey Graefe H RN BSN °12/06/21 ° °

## 2021-12-12 ENCOUNTER — Encounter (HOSPITAL_COMMUNITY): Payer: Self-pay | Admitting: Obstetrics and Gynecology

## 2021-12-12 ENCOUNTER — Inpatient Hospital Stay (HOSPITAL_COMMUNITY)
Admission: AD | Admit: 2021-12-12 | Discharge: 2021-12-12 | Disposition: A | Discharge status: Home / Self Care | Payer: Self-pay | Attending: Obstetrics and Gynecology | Admitting: Obstetrics and Gynecology

## 2021-12-12 DIAGNOSIS — Z3689 Encounter for other specified antenatal screening: Secondary | ICD-10-CM

## 2021-12-12 DIAGNOSIS — Z7982 Long term (current) use of aspirin: Secondary | ICD-10-CM | POA: Insufficient documentation

## 2021-12-12 DIAGNOSIS — O26853 Spotting complicating pregnancy, third trimester: Secondary | ICD-10-CM | POA: Insufficient documentation

## 2021-12-12 DIAGNOSIS — Z3A35 35 weeks gestation of pregnancy: Secondary | ICD-10-CM | POA: Insufficient documentation

## 2021-12-12 DIAGNOSIS — O09523 Supervision of elderly multigravida, third trimester: Secondary | ICD-10-CM | POA: Insufficient documentation

## 2021-12-12 DIAGNOSIS — Z87891 Personal history of nicotine dependence: Secondary | ICD-10-CM | POA: Insufficient documentation

## 2021-12-12 LAB — HEMOGLOBIN AND HEMATOCRIT, BLOOD
HCT: 34.7 % — ABNORMAL LOW (ref 36.0–46.0)
Hemoglobin: 12.1 g/dL (ref 12.0–15.0)

## 2021-12-12 LAB — URINALYSIS, ROUTINE W REFLEX MICROSCOPIC
Bilirubin Urine: NEGATIVE
Glucose, UA: NEGATIVE mg/dL
Ketones, ur: NEGATIVE mg/dL
Nitrite: NEGATIVE
Protein, ur: NEGATIVE mg/dL
Specific Gravity, Urine: 1.01 (ref 1.005–1.030)
pH: 6 (ref 5.0–8.0)

## 2021-12-12 LAB — URINALYSIS, MICROSCOPIC (REFLEX)

## 2021-12-12 LAB — AMNISURE RUPTURE OF MEMBRANE (ROM) NOT AT ARMC: Amnisure ROM: NEGATIVE

## 2021-12-12 LAB — POCT FERN TEST: POCT Fern Test: NEGATIVE

## 2021-12-12 MED ORDER — CEFADROXIL 500 MG PO CAPS: 500.0000 mg | ORAL_CAPSULE | Freq: Two times a day (BID) | ORAL | 0 refills | Status: DC

## 2021-12-12 MED ORDER — ACETAMINOPHEN 325 MG PO TABS: 650.0000 mg | ORAL_TABLET | ORAL | 0 refills | Status: AC | PRN

## 2021-12-12 MED ORDER — LACTATED RINGERS IV BOLUS
1000.0000 mL | Freq: Once | INTRAVENOUS | Status: AC
  Administered 2021-12-12: 1000 mL via INTRAVENOUS

## 2021-12-12 MED ORDER — ACETAMINOPHEN 500 MG PO TABS
1000.0000 mg | ORAL_TABLET | Freq: Once | ORAL | Status: AC
  Administered 2021-12-12: 1000 mg via ORAL
  Filled 2021-12-12: qty 2

## 2021-12-12 NOTE — MAU Provider Note (Signed)
History     CSN: [id number]  Arrival date and time: 12/12/21 2248   Event Date/Time   First Provider Initiated Contact with Patient 12/12/21 [phone]      Chief Complaint  Patient presents with   Vaginal Bleeding   Abdominal Pain   HPI Bonnie Morales is a 41 y.o. [id number] at [redacted]w[redacted]d who presents to MAU with chief complaint of vaginal spotting in the setting of cerclage and vaginal Prometrium . This is a new problem, onset at 0530 this morning. Patient endorses seeing smears of blood when she wiped after voiding. Since then she has visualized scant dark red blood "with water". She denies bright red bleeding, heavy bleeding, weakness or dizzyness.  Patient also reports lower abdominal cramping, onset coinciding with onset of spotting this morning. Pain score is 3/10. She denies aggravating or alleviating factors. She has not taken medication or tried other treatments for this complaint.  Patient receives care with [facility].   OB History     Gravida  4   Para  3   Term  2   Preterm  1   AB  0   Living  3      SAB  0   IAB  0   Ectopic  0   Multiple  0   Live Births  3           Past Medical History:  Diagnosis Date   Chronic hypertension    Gestational diabetes    History of kidney stones    passed stone   Hypertension    2014   Pre-eclampsia     Past Surgical History:  Procedure Laterality Date   CERVICAL CERCLAGE N/A 04/12/2017   Procedure: CERCLAGE CERVICAL;  Surgeon: Chancy Milroy, MD;  Location: [facility];  Service: Gynecology;  Laterality: N/A;   CERVICAL CERCLAGE N/A 07/25/2021   Procedure: CERCLAGE CERVICAL;  Surgeon: Chancy Milroy, MD;  Location: [facility];  Service: Gynecology;  Laterality: N/A;   CESAREAN SECTION     epidural - in [place]    Family History  Problem Relation Age of Onset   Diabetes Mother    Hypertension Mother    Heart disease Mother        tachycardia   Hypertension Father    Mental retardation Sister    Cancer  Paternal Grandmother     Social History   Tobacco Use   Smoking status: Former    Packs/day: 0.25    Years: 5.00    Pack years: 1.25    Types: Cigarettes    Quit date: 09/12/2016    Years since quitting: 5.2   Smokeless tobacco: Never  Vaping Use   Vaping Use: Never used  Substance Use Topics   Alcohol use: No   Drug use: No    Allergies:  Allergies  Allergen Reactions   Latex     burning   Sulfa Antibiotics Hives    Reports high fever and red rash on arms    Medications Prior to Admission  Medication Sig Dispense Refill Last Dose   amLODipine (NORVASC) 10 MG tablet Take 1 tablet (10 mg total) by mouth daily. 90 tablet 3 12/11/2021   aspirin 81 MG tablet Take 81 mg by mouth daily.   12/11/2021   metFORMIN (GLUCOPHAGE) 500 MG tablet Take 1 tablet (500 mg total) by mouth 2 (two) times daily with a meal. Start with one at night for 3-5 days. 60 tablet 0 12/11/2021  Prenatal Vit-Fe Fumarate-FA (PRENATAL MULTIVITAMIN) TABS tablet Take 1 tablet by mouth daily.   12/11/2021   progesterone (PROMETRIUM) 200 MG capsule Place 1 capsule (200 mg total) vaginally daily. 30 capsule 8 12/11/2021    Review of Systems  Gastrointestinal:  Positive for abdominal pain.  Genitourinary:  Positive for vaginal bleeding.  All other systems reviewed and are negative. Physical Exam   Blood pressure 111/61, pulse 67, temperature 98.2 F (36.8 C), temperature source Oral, resp. rate 15, height 5\' 5"  (1.651 m), weight 92.1 kg, last menstrual period 04/10/2021, SpO2 100 %, currently breastfeeding.  Physical Exam Vitals and nursing note reviewed. Exam conducted with a chaperone present.  Constitutional:      Appearance: She is well-developed. She is not ill-appearing.  Cardiovascular:     Rate and Rhythm: Normal rate and regular rhythm.     Heart sounds: Normal heart sounds.  Pulmonary:     Effort: Pulmonary effort is normal.     Breath sounds: Normal breath sounds.  Abdominal:     Palpations:  Abdomen is soft.     Tenderness: There is no abdominal tenderness. There is no right CVA tenderness or left CVA tenderness.     Comments: Gravid  Genitourinary:    Vagina: Normal.     Cervix: Normal.     Uterus: Normal.      Comments: Pelvic exam: External genitalia normal, vaginal walls pink and well rugated, cervix visually closed, no lesions noted. Scant pinpoint spot of blood on end of speculum. Otherwise no blood visualized. No evidence of tension against cerclage. Negative pooling, negative fern.   Skin:    Capillary Refill: Capillary refill takes less than 2 seconds.  Neurological:     Mental Status: She is alert and oriented to person, place, and time.  Psychiatric:        Mood and Affect: Mood normal.        Behavior: Behavior normal.    MAU Course/MDM  Procedures  --Reactive tracing: baseline 135, mod var, + 15 x 15 accels, no decels --Toco: initially rare contraction with UI, resolved to quiet with IV fluid bolus --Pain score 3/10 improved to 2/10 s/p interventions in MAU --Abnormal UA with presence of squam epi suggesting contaminated catch. Patient reports hx of dysuria, now resolved without treatment but prefers to initiate abx now instead of waiting for culture results. Will send Duricef --Intact amniotic sac: negative fern, negative pooling, negative Amnisure --No evidence of contracting against cerclage. Will leave in place with strict return precautions  Patient Vitals for the past 24 hrs:  BP Temp Temp src Pulse Resp SpO2 Height Weight  12/12/21 0930 119/67 -- -- 66 15 100 % -- --  12/12/21 0745 111/61 -- -- 67 -- 100 % -- --  12/12/21 0729 131/70 98.2 F (36.8 C) Oral 73 18 99 % 5\' 5"  (1.651 m) 92.1 kg   Results for orders placed or performed during the hospital encounter of 12/12/21 (from the past 24 hour(s))  Urinalysis, Routine w reflex microscopic Urine, Clean Catch     Status: Abnormal   Collection Time: 12/12/21  7:30 AM  Result Value Ref Range    Color, Urine YELLOW YELLOW   APPearance CLEAR CLEAR   Specific Gravity, Urine 1.010 1.005 - 1.030   pH 6.0 5.0 - 8.0   Glucose, UA NEGATIVE NEGATIVE mg/dL   Hgb urine dipstick LARGE (A) NEGATIVE   Bilirubin Urine NEGATIVE NEGATIVE   Ketones, ur NEGATIVE NEGATIVE mg/dL   Protein, ur  NEGATIVE NEGATIVE mg/dL   Nitrite NEGATIVE NEGATIVE   Leukocytes,Ua MODERATE (A) NEGATIVE  Urinalysis, Microscopic (reflex)     Status: Abnormal   Collection Time: 12/12/21  7:30 AM  Result Value Ref Range   RBC / HPF 21-50 0 - 5 RBC/hpf   WBC, UA 11-20 0 - 5 WBC/hpf   Bacteria, UA MANY (A) NONE SEEN   Squamous Epithelial / LPF 11-20 0 - 5   Mucus PRESENT   Fern Test     Status: None   Collection Time: 12/12/21  8:00 AM  Result Value Ref Range   POCT Fern Test Negative = intact amniotic membranes   Amnisure rupture of membrane (rom)not at [facility]     Status: None   Collection Time: 12/12/21  8:14 AM  Result Value Ref Range   Amnisure ROM NEGATIVE   Hemoglobin and hematocrit, blood     Status: Abnormal   Collection Time: 12/12/21  8:31 AM  Result Value Ref Range   Hemoglobin 12.1 12.0 - 15.0 g/dL   HCT 34.7 (L) 36.0 - 46.0 %   Meds ordered this encounter  Medications   acetaminophen (TYLENOL) tablet 1,000 mg   lactated ringers bolus 1,000 mL   cefadroxil (DURICEF) 500 MG capsule    Sig: Take 1 capsule (500 mg total) by mouth 2 (two) times daily.    Dispense:  14 capsule    Refill:  0    Order Specific Question:   Supervising Provider    Answer:   Lynnda Shields A [[id number]]   acetaminophen (TYLENOL) 325 MG tablet    Sig: Take 2 tablets (650 mg total) by mouth every 4 (four) hours as needed for moderate pain.    Dispense:  30 tablet    Refill:  0    Order Specific Question:   Supervising Provider    Answer:   Griffin Basil [[id number]]   Assessment and Plan  --41 y.o. [phone] at [redacted]w[redacted]d  --Reactive tracing --Closed cervix, cerclage in place, removal not indicated --Intact amniotic sac --Hgb  12.1, blood type O POS --Urine culture in work, abx to pharmacy per patient preference --Language barrier: interpreter Angelica Chessman present for all patient interaction --Discharge home in stable condition  F/U: --Next appointment at [facility] is 12/14/2021  Darlina Rumpf, [place] 12/12/2021, 10:27 AM

## 2021-12-12 NOTE — MAU Note (Signed)
Went to the bathroom around 0530, when she wiped, she noted some blood. Then blood with water.  Before she came she noted darker blood.  Only seeing when she wipes.  No more water.  Having little cramps. No placental issues with this preg. Denies recent intercourse.  Cerclage is still in place.

## 2021-12-14 ENCOUNTER — Encounter: Payer: Self-pay | Admitting: Obstetrics and Gynecology

## 2021-12-14 ENCOUNTER — Other Ambulatory Visit: Payer: Self-pay

## 2021-12-14 ENCOUNTER — Ambulatory Visit (INDEPENDENT_AMBULATORY_CARE_PROVIDER_SITE_OTHER): Payer: Self-pay | Admitting: Obstetrics and Gynecology

## 2021-12-14 ENCOUNTER — Ambulatory Visit (INDEPENDENT_AMBULATORY_CARE_PROVIDER_SITE_OTHER): Payer: Self-pay

## 2021-12-14 ENCOUNTER — Ambulatory Visit (INDEPENDENT_AMBULATORY_CARE_PROVIDER_SITE_OTHER): Payer: Self-pay | Admitting: General Practice

## 2021-12-14 VITALS — BP 122/82 | HR 71 | Wt 204.4 lb

## 2021-12-14 DIAGNOSIS — O10919 Unspecified pre-existing hypertension complicating pregnancy, unspecified trimester: Secondary | ICD-10-CM

## 2021-12-14 DIAGNOSIS — Z8742 Personal history of other diseases of the female genital tract: Secondary | ICD-10-CM

## 2021-12-14 DIAGNOSIS — O24415 Gestational diabetes mellitus in pregnancy, controlled by oral hypoglycemic drugs: Secondary | ICD-10-CM

## 2021-12-14 DIAGNOSIS — I10 Essential (primary) hypertension: Secondary | ICD-10-CM

## 2021-12-14 DIAGNOSIS — N883 Incompetence of cervix uteri: Secondary | ICD-10-CM

## 2021-12-14 DIAGNOSIS — Z789 Other specified health status: Secondary | ICD-10-CM

## 2021-12-14 DIAGNOSIS — O0990 Supervision of high risk pregnancy, unspecified, unspecified trimester: Secondary | ICD-10-CM

## 2021-12-14 DIAGNOSIS — O3433 Maternal care for cervical incompetence, third trimester: Secondary | ICD-10-CM

## 2021-12-14 DIAGNOSIS — Z3A35 35 weeks gestation of pregnancy: Secondary | ICD-10-CM

## 2021-12-14 LAB — CULTURE, OB URINE

## 2021-12-14 NOTE — Progress Notes (Signed)
Subjective:  Bonnie Morales is a 41 y.o. [phone] at [redacted]w[redacted]d being seen today for ongoing prenatal care.  She is currently monitored for the following issues for this high-risk pregnancy and has History of VBAC; History of eclampsia; Language barrier; Chronic hypertension; History of cervical incompetence; Supervision of high risk pregnancy, antepartum; Oral hypoglycemic controlled White classification A2 gestational diabetes mellitus (GDM); Incompetence of cervix; and Cervical cerclage suture present in third trimester on their problem list.  Patient reports general discomforts of pregnancy.  Contractions: Irritability.  .  Movement: Present. Denies leaking of fluid.   The following portions of the patient's history were reviewed and updated as appropriate: allergies, current medications, past family history, past medical history, past social history, past surgical history and problem list. Problem list updated.  Objective:   Vitals:   12/14/21 0958  BP: 122/82  Pulse: 71  Weight: 204 lb 6.4 oz (92.7 kg)    Fetal Status: Fetal Heart Rate (bpm): 154   Movement: Present     General:  Alert, oriented and cooperative. Patient is in no acute distress.  Skin: Skin is warm and dry. No rash noted.   Cardiovascular: Normal heart rate noted  Respiratory: Normal respiratory effort, no problems with respiration noted  Abdomen: Soft, gravid, appropriate for gestational age. Pain/Pressure: Present     Pelvic:  Cervical exam deferred        Extremities: Normal range of motion.  Edema: None  Mental Status: Normal mood and affect. Normal behavior. Normal judgment and thought content.   Urinalysis:      Assessment and Plan:  Pregnancy: [id number] at [redacted]w[redacted]d  1. Supervision of high risk pregnancy, antepartum Stable  2. Cervical cerclage suture present in third trimester Remove cerclage at next OB visit Continue with Prometrium  3. Incompetence of cervix See above  4. Oral hypoglycemic controlled  White classification A2 gestational diabetes mellitus (GDM) Did not bring CBG log but reports CBG's in goal range Continue with Metformin Antenatal testing and serial growth scans as per protocol  5. Chronic hypertension BP stable Continue with current management Antenatal testing and serial growth scans as above  6. Language barrier Live interrupter used during today's visit  7. History of cervical incompetence See above  Preterm labor symptoms and general obstetric precautions including but not limited to vaginal bleeding, contractions, leaking of fluid and fetal movement were reviewed in detail with the patient. Please refer to After Visit Summary for other counseling recommendations.  Return in about 1 week (around 12/21/2021) for OB visit, face to face, MD only cerclage removal.   Chancy Milroy, MD

## 2021-12-14 NOTE — Progress Notes (Signed)
Pt informed that the ultrasound is considered a limited OB ultrasound and is not intended to be a complete ultrasound exam.  Patient also informed that the ultrasound is not being completed with the intent of assessing for fetal or placental anomalies or any pelvic abnormalities.  Explained that the purpose of todays ultrasound is to assess for  BPP, presentation, and AFI.  Patient acknowledges the purpose of the exam and the limitations of the study.     Koren Bound RN BSN 12/14/21

## 2021-12-14 NOTE — Patient Instructions (Signed)
Tercer trimestre de Media planner Third Trimester of Pregnancy El tercer trimestre de embarazo va desde la semana 28 hasta la semana 40. Tambin se dice que va desde el mes 7 hasta el mes 9. En este trimestre, el beb en gestacin (feto) crece muy rpidamente. Hacia el final del noveno mes, el beb en gestacin mide alrededor de 20 pulgadas (45 cm) de largo. Pesa entre 6 y 68 libras (2,70 y 4,50 kg). Cambios en el cuerpo durante el tercer trimestre Su organismo contina atravesando por muchos cambios durante este perodo. Los cambios varan y generalmente vuelven a la normalidad despus del nacimiento del beb. Cambios fsicos Seguir [employer]. Puede ser que aumente entre 25 y 12 libras (11 y 16 kg) hacia el final del Media planner. Si tiene [employer], puede aumentar entre 28 y 40 lb (unos 13 a 18 kg). Si tiene sobrepeso, puede aumentar entre 15 y 25 libras (unos 7 a 11 kg). Podrn aparecer las primeras estras en las caderas, el vientre (abdomen) y las [place]. [employer] seguirn creciendo y Tourist information centre manager. Un lquido amarillo Public affairs consultant) puede salir de sus pechos. Esta es la primera leche que usted produce para el beb. Tal vez haya cambios en el cabello. El ombligo puede salir hacia afuera. Puede observar que se le hinchan ms las manos, la cara o los tobillos. Cambios en la salud Es posible que tenga acidez estomacal. Es posible que tenga dificultades para defecar (estreimiento). Pueden aparecerle hemorroides. Estas son venas hinchadas en el ano que pueden picar o doler. Puede comenzar a tener venas hinchadas (vrices) en las piernas. Puede presentar ms dolor en la pelvis, la espalda o los muslos. Puede presentar ms hormigueo o entumecimiento en las manos, los brazos y las piernas. La piel de su vientre tambin puede sentirse entumecida. Es posible que sienta falta de aire a medida que el tero se [place]. Otros cambios Es posible que haga pis (orine) con mayor frecuencia. Puede tener ms  problemas para dormir. Puede notar que el beb en gestacin baja o se mueve ms hacia bajo, en el vientre. Puede notar ms secrecin proveniente de la vagina. Puede sentir las articulaciones flojas y Agricultural consultant alrededor del hueso plvico. Siga estas instrucciones en su casa: Medicamentos Use los medicamentos de venta libre y los recetados solamente como se lo haya indicado el mdico. Algunos medicamentos no son seguros Solicitor. Tome vitaminas prenatales que contengan por lo menos 600 microgramos (mcg) de cido flico. Comida y bebida Consuma comidas saludables que incluyan lo siguiente: Lambert Mody y verduras frescas. Cereales integrales. Buenas fuentes de protenas, como carne, huevos y tofu. Productos lcteos con bajo contenido de [place]. Evite la carne cruda y el [place], la [place] y el queso sin Radio producer. Estos portan grmenes que pueden provocar dao tanto a usted como al beb. Tome 4 o 5 comidas pequeas en lugar de 3 comidas abundantes al da. Es posible que deba tomar medidas para prevenir o tratar los problemas para defecar: Electronics engineer suficiente lquido para Contractor pis (orina) de color amarillo plido. Come alimentos ricos en fibra. Entre ellos, frijoles, cereales integrales y frutas y verduras frescas. Limitar los alimentos con alto contenido de grasa y Location manager. Estos incluyen alimentos fritos o dulces. Actividad Haga ejercicios solamente como se lo haya indicado el mdico. Interrumpa la actividad fsica si comienza a tener clicos en el tero. Evite levantar pesos [employer]. No haga ejercicio si hace demasiado calor, hay demasiada humedad o se encuentra en un lugar de Sales executive (  altitud elevada). Si lo desea, puede continuar teniendo [employer], a menos que el mdico le indique lo contrario. Alivio del dolor y del malestar Haga pausas con frecuencia y descanse con las piernas levantadas (elevadas) si tiene calambres en las piernas o dolor en la parte  baja de la espalda. Dese baos de asiento con agua tibia para Best boy o las molestias causadas por las hemorroides. Use una crema para las hemorroides si el mdico la autoriza. Use un sostn que le brinde buen soporte si sus mamas estn sensibles. Si desarrolla venas hinchadas y abultadas en las piernas: Use medias de compresin segn las indicaciones de su mdico. Levante los pies durante 15 minutos, 3 o 4 veces por Training and development officer. Limite la sal en sus alimentos. Seguridad Hable con el mdico antes de Control and instrumentation engineer. No se d baos de inmersin en agua caliente, baos turcos ni saunas. Use el cinturn de seguridad en todo momento mientras vaya en auto. Hable con el mdico si alguien le est haciendo dao o gritando [place]. Preparacin para la llegada del beb Para prepararse para la llegada de su beb: Tome clases prenatales. Visite el hospital y recorra el rea de maternidad. Compre un asiento de seguridad [employer] atrs para llevar al beb en el automvil. Aprenda cmo instalarlo en el auto. Prepare la habitacin del beb. Saque todas las almohadas y los animales de peluche de la cuna del beb. Instrucciones generales Evite el contacto con las bandejas sanitarias de los gatos y la tierra que estos animales usan. Estos contienen grmenes que pueden daar al beb y causar la prdida del beb ya sea aborto espontneo o muerte fetal. No se haga duchas vaginales ni use tampones. No use tampones ni toallas higinicas perfumadas. No fume ni consuma ningn producto que contenga nicotina o tabaco. Si necesita ayuda para dejar de fumar, consulte al mdico. No beba alcohol. No use medicamentos a base de hierbas, drogas ilegales, ni medicamentos que el mdico no haya autorizado. Las sustancias qumicas de estos productos pueden afectar al beb. Cumpla con todas las visitas de seguimiento. Esto es importante. Dnde buscar ms informacin American Pregnancy Association (Asociacin  Americana del Embarazo): americanpregnancy.org [employer] of Obstetricians and Gynecologists (Colegio Estadounidense de Obstetras y Gineclogos): www.acog.org Office on [employer] ([place]): [email or web address].br Comunquese con un mdico si: Tiene fiebre. Tiene clicos leves o siente presin en la parte baja del vientre. Sufre un dolor persistente en el abdomen. Vomita o hace deposiciones acuosas (diarrea). Advierte lquido con mal olor que proviene de la vagina. Siente dolor al orinar o hace orina con mal olor. Tiene un dolor de cabeza que no desaparece despus de Teacher, adult education. Nota cambios en la visin o ve manchas delante de los ojos. Solicite ayuda de inmediato si: Rompe la bolsa. Tiene contracciones regulares separadas por menos de 5 minutos. Tiene sangrado o pequeas prdidas vaginales. Tiene clicos o dolor muy intensos en el vientre. Tiene dificultad para respirar. Sientes dolor en el pecho. Se desmaya. No ha sentido al beb moverse durante el tiempo que le indic el mdico. Tiene dolor, hinchazn o enrojecimiento nuevos en un brazo o una pierna o se produce un aumento de alguno de estos sntomas. Resumen El tercer trimestre comprende desde la semana 28 hasta la semana 40 (desde el mes 7 hasta el mes 9). Esta es la poca en que el beb en gestacin crece muy rpidamente. Durante este perodo, las molestias pueden aumentar a medida que usted sube  de peso y el beb crece. Preprese para la llegada del beb: asista a las clases prenatales, compre un asiento de seguridad orientado hacia atrs para llevar al beb en auto y prepare la habitacin del beb. Solicite ayuda de inmediato si tiene sangrado por la vagina, siente dolor en el pecho y tiene dificultad para respirar, o si no ha sentido al beb moverse durante el tiempo que le indic el mdico. Esta informacin no tiene Marine scientist el consejo del mdico. Asegrese de hacerle al  mdico cualquier pregunta que tenga. Document Revised: 05/11/2020 Document Reviewed: 05/11/2020 Elsevier Patient Education  [place].

## 2021-12-20 ENCOUNTER — Ambulatory Visit: Payer: Self-pay | Attending: Obstetrics and Gynecology

## 2021-12-20 ENCOUNTER — Other Ambulatory Visit: Payer: Self-pay | Admitting: Obstetrics and Gynecology

## 2021-12-20 ENCOUNTER — Ambulatory Visit: Payer: Self-pay | Admitting: *Deleted

## 2021-12-20 ENCOUNTER — Ambulatory Visit (HOSPITAL_BASED_OUTPATIENT_CLINIC_OR_DEPARTMENT_OTHER): Payer: Self-pay | Admitting: *Deleted

## 2021-12-20 ENCOUNTER — Other Ambulatory Visit: Payer: Self-pay

## 2021-12-20 VITALS — BP 131/68 | HR 75

## 2021-12-20 DIAGNOSIS — O34219 Maternal care for unspecified type scar from previous cesarean delivery: Secondary | ICD-10-CM

## 2021-12-20 DIAGNOSIS — O99213 Obesity complicating pregnancy, third trimester: Secondary | ICD-10-CM

## 2021-12-20 DIAGNOSIS — Z3A36 36 weeks gestation of pregnancy: Secondary | ICD-10-CM

## 2021-12-20 DIAGNOSIS — O283 Abnormal ultrasonic finding on antenatal screening of mother: Secondary | ICD-10-CM | POA: Insufficient documentation

## 2021-12-20 DIAGNOSIS — O24415 Gestational diabetes mellitus in pregnancy, controlled by oral hypoglycemic drugs: Secondary | ICD-10-CM | POA: Insufficient documentation

## 2021-12-20 DIAGNOSIS — O10913 Unspecified pre-existing hypertension complicating pregnancy, third trimester: Secondary | ICD-10-CM | POA: Insufficient documentation

## 2021-12-20 DIAGNOSIS — O0990 Supervision of high risk pregnancy, unspecified, unspecified trimester: Secondary | ICD-10-CM

## 2021-12-20 DIAGNOSIS — O3433 Maternal care for cervical incompetence, third trimester: Secondary | ICD-10-CM | POA: Insufficient documentation

## 2021-12-20 DIAGNOSIS — O10013 Pre-existing essential hypertension complicating pregnancy, third trimester: Secondary | ICD-10-CM

## 2021-12-20 DIAGNOSIS — O09293 Supervision of pregnancy with other poor reproductive or obstetric history, third trimester: Secondary | ICD-10-CM

## 2021-12-20 NOTE — Progress Notes (Signed)
Pt has been experiencing a pulling feeling in her vagina since last Friday. She has an appt tomorrow morn. Gave labor precautions to pt.

## 2021-12-20 NOTE — Procedures (Signed)
Bonnie Morales [birth year] [redacted]w[redacted]d  Fetus A Non-Stress Test Interpretation for 12/20/21  Indication: Unsatisfactory BPP  Fetal Heart Rate A Mode: External Baseline Rate (A): 130 bpm Variability: Moderate Accelerations: 15 x 15 Decelerations: None Multiple birth?: No  Uterine Activity Mode: Palpation, Toco Contraction Frequency (min): none Resting Tone Palpated: Relaxed  Interpretation (Fetal Testing) Nonstress Test Interpretation: Reactive Overall Impression: Reassuring for gestational age Comments: Dr. Gertie Exon reviewed tracing

## 2021-12-21 ENCOUNTER — Encounter: Payer: Self-pay | Admitting: Obstetrics and Gynecology

## 2021-12-25 ENCOUNTER — Ambulatory Visit (INDEPENDENT_AMBULATORY_CARE_PROVIDER_SITE_OTHER): Payer: Self-pay | Admitting: Obstetrics & Gynecology

## 2021-12-25 ENCOUNTER — Encounter: Payer: Self-pay | Admitting: Obstetrics & Gynecology

## 2021-12-25 ENCOUNTER — Other Ambulatory Visit (HOSPITAL_COMMUNITY)
Admission: RE | Admit: 2021-12-25 | Discharge: 2021-12-25 | Disposition: A | Discharge status: Home / Self Care | Payer: Self-pay | Source: Ambulatory Visit | Attending: Obstetrics and Gynecology | Admitting: Obstetrics and Gynecology

## 2021-12-25 ENCOUNTER — Other Ambulatory Visit: Payer: Self-pay

## 2021-12-25 VITALS — BP 123/79 | HR 64 | Wt 204.6 lb

## 2021-12-25 DIAGNOSIS — O0990 Supervision of high risk pregnancy, unspecified, unspecified trimester: Secondary | ICD-10-CM | POA: Insufficient documentation

## 2021-12-25 DIAGNOSIS — O3433 Maternal care for cervical incompetence, third trimester: Secondary | ICD-10-CM

## 2021-12-25 DIAGNOSIS — Z8742 Personal history of other diseases of the female genital tract: Secondary | ICD-10-CM

## 2021-12-25 DIAGNOSIS — O24415 Gestational diabetes mellitus in pregnancy, controlled by oral hypoglycemic drugs: Secondary | ICD-10-CM

## 2021-12-25 DIAGNOSIS — Z3A37 37 weeks gestation of pregnancy: Secondary | ICD-10-CM

## 2021-12-25 DIAGNOSIS — I10 Essential (primary) hypertension: Secondary | ICD-10-CM

## 2021-12-25 DIAGNOSIS — Z3A36 36 weeks gestation of pregnancy: Secondary | ICD-10-CM

## 2021-12-25 NOTE — Progress Notes (Signed)
PRENATAL VISIT NOTE  Subjective:  Bonnie Morales is a 41 y.o. [phone] at [redacted]w[redacted]d being seen today for ongoing prenatal care.  Patient is Spanish-speaking only, interpreter present for this encounter. She is currently monitored for the following issues for this high-risk pregnancy and has History of VBAC; History of eclampsia; Language barrier; Chronic hypertension; History of cervical incompetence; Supervision of high risk pregnancy, antepartum; and Oral hypoglycemic controlled White classification A2 gestational diabetes mellitus (GDM) on their problem list.  Patient reports occasional contractions.  Contractions: Irritability. Vag. Bleeding: None.  Movement: Present. Denies leaking of fluid.   The following portions of the patient's history were reviewed and updated as appropriate: allergies, current medications, past family history, past medical history, past social history, past surgical history and problem list.   Objective:   Vitals:   12/25/21 1542  BP: 123/79  Pulse: 64  Weight: 204 lb 9.6 oz (92.8 kg)    Fetal Status: Fetal Heart Rate (bpm): 160   Movement: Present  Presentation: Vertex  General:  Alert, oriented and cooperative. Patient is in no acute distress.  Skin: Skin is warm and dry. No rash noted.   Cardiovascular: Normal heart rate noted  Respiratory: Normal respiratory effort, no problems with respiration noted  Abdomen: Soft, gravid, appropriate for gestational age.  Pain/Pressure: Present     Pelvic: Cervical exam performed in the presence of a chaperone  Cerclage removal:  Anterior knot of cervical cerclage was recognized and pulled upwards to visualize both sides of the circumferential suture under the knot. One side was cut and the remaining suture was pulled and removed intact. There was some bleeding noted.  Speculum was removed, and cervix was checked and found to be Dilation: 2 Effacement (%): 60 Station: -3  Extremities: Normal range of motion.  Edema:  None  Mental Status: Normal mood and affect. Normal behavior. Normal judgment and thought content.   Imaging: [place] MFM FETAL BPP W/NONSTRESS  Result Date: 12/20/2021 ----------------------------------------------------------------------  OBSTETRICS REPORT                       (Signed Final 12/20/2021 04:55 pm) ---------------------------------------------------------------------- Patient Info  ID #:       [id number]                          D.O.B.:  [birth year] (40 yrs)  Name:       Bonnie Morales             Visit Date: 12/20/2021 02:55 pm ---------------------------------------------------------------------- Performed By  Attending:        Sander Nephew      Ref. Address:     [address]  [place], [place]  Performed By:     Dorena Dew     [facility]                    BS, RDMS                                 F[facility] at                                                             [place] for                                                             Women  Referred By:      Chancy Milroy                    MD ---------------------------------------------------------------------- Orders  #  Description                           Code        Ordered By  1  [place] MFM OB FOLLOW UP                   76816.01    RAVI SHANKAR  2  [place] MFM FETAL BPP                      41660.6     RAVI [facility]     W/NONSTRESS ----------------------------------------------------------------------  #  Order #                     Accession #                Episode #  1  [id number]                   [id number]                 [id number]  2  [id number]                   [id number]                 [id number] ---------------------------------------------------------------------- Indications   Pre-existing essential hypertension            O35.009  complicating pregnancy, third trimester  [redacted] weeks gestation of pregnancy                Z3A.36  Gestational diabetes in pregnancy,  O24.415  controlled by oral hypoglycemic drugs  Pre-existing essential hypertension            F81.829  complicating pregnancy, third trimester  Cervical cerclage suture present, third        O34.33  trimester  Poor obstetric history: Previous               O09.299  preeclampsia / eclampsia/gestational  [phone]  History of cesarean delivery, currently        O5.219  pregnant (VBAC x 2)  Uterine fibroids affecting pregnancy in third  O34.13, D25.9  trimester, antepartum ---------------------------------------------------------------------- Fetal Evaluation  Num Of Fetuses:         1  Fetal Heart Rate(bpm):  152  Cardiac Activity:       Observed  Presentation:           Cephalic  Placenta:               Posterior  P. Cord Insertion:      Previously Visualized  Amniotic Fluid  AFI FV:      Within normal limits  AFI Sum(cm)     %Tile       Largest Pocket(cm)  17.4            65          5.9  RUQ(cm)       RLQ(cm)       LUQ(cm)        LLQ(cm)  3.7           5.2           2.6            5.9 ---------------------------------------------------------------------- Biophysical Evaluation  Amniotic F.V:   Within normal limits       F. Tone:        Observed  F. Movement:    Observed                   N.S.T:          Reactive  F. Breathing:   Not Observed               Score:          8/10 ---------------------------------------------------------------------- Biometry  BPD:      93.5  mm     G. Age:  38w 0d         94  %    CI:        82.37   %    70 - 86                                                          FL/HC:      20.5   %    20.1 - 22.1  HC:       325   mm     G. Age:  36w 6d         33  %    HC/AC:      1.01        0.93 - 1.11  AC:      320.8  mm     G. Age:  36w 0d         53  %    FL/BPD:  71.2   %    71 - 87   FL:       66.6  mm     G. Age:  34w 2d          7  %    FL/AC:      20.8   %    20 - 24  HUM:      59.4  mm     G. Age:  34w 3d         32  %  LV:        6.2  mm  Est. FW:    2785  gm      6 lb 2 oz     40  % ---------------------------------------------------------------------- OB History  Gravidity:    4  Living:       3 ---------------------------------------------------------------------- Gestational Age  LMP:           36w 2d        Date:  04/10/21                 EDD:   01/15/22  U/S Today:     36w 2d                                        EDD:   01/15/22  Best:          36w 2d     Det. By:  LMP  (04/10/21)          EDD:   01/15/22 ---------------------------------------------------------------------- Anatomy  Cranium:               Appears normal         Aortic Arch:            Previously seen  Cavum:                 Previously seen        Ductal Arch:            Previously seen  Ventricles:            Appears normal         Diaphragm:              Appears normal  Choroid Plexus:        Previously seen        Stomach:                Appears normal, left                                                                        sided  Cerebellum:            Previously seen        Abdomen:                Previously seen  Posterior Fossa:       Previously seen        Abdominal Wall:         Previously seen  Nuchal Fold:           Not  applicable (>83    Cord Vessels:           Previously seen                         wks GA)  Face:                  Orbits and profile     Kidneys:                Appear normal                         previously seen  Lips:                  Previously seen        Bladder:                Appears normal  Thoracic:              Previously seen        Spine:                  Previously seen  Heart:                 Appears normal         Upper Extremities:      Previously seen                         (4CH, axis, and                         situs)  RVOT:                  Previously seen         Lower Extremities:      Previously seen  LVOT:                  Previously seen  Other:  Female gender previously seen. VC, 3VV and 3VTV previously          visualized. Technically difficult due to maternal habitus ---------------------------------------------------------------------- Cervix Uterus Adnexa  Cervix  Not visualized (advanced GA >24wks) ---------------------------------------------------------------------- Impression  Follow up growth due to A2GDM on oral therapy.  She has a cerclage in place with plans for removal at her next  appointment.  Normal interval growth with measurements consistent with  dates  Good fetal movement and amniotic fluid volume  Biophysical profile 8/10  She reports good fetal movement.  She has known hypertension with blood pressure 131/68  mmHg. ---------------------------------------------------------------------- Recommendations  She is scheduled for delivery at 37 weeks  Continue daily kick counts. ----------------------------------------------------------------------               Sander Nephew, MD Electronically Signed Final Report   12/20/2021 04:55 pm ----------------------------------------------------------------------  [place] MFM FETAL BPP WO NON STRESS  Result Date: 11/27/2021 ----------------------------------------------------------------------  OBSTETRICS REPORT                       (Signed Final 11/27/2021 03:12 pm) ---------------------------------------------------------------------- Patient Info  ID #:       [id number]                          D.O.B.:  13-Oct-1981 (40 yrs)  Name:       Bonnie NORFOLK  Morales             Visit Date: 11/27/2021 02:08 pm ---------------------------------------------------------------------- Performed By  Attending:        Johnell Comings MD         Ref. Address:     [place], [place]  Performed By:     Rodrigo Ran BS      [facility]                    RDMS RVT                                 Fetal Care at                                                             [place] for                                                             Women  Referred By:      Chancy Milroy                    MD ---------------------------------------------------------------------- Orders  #  Description                           Code        Ordered By  1  [place] MFM FETAL BPP WO NON               73532.99    RAVI [facility]     STRESS ----------------------------------------------------------------------  #  Order #                     Accession #                Episode #  1  [id number]  [id number]                 [id number] ---------------------------------------------------------------------- Indications  Gestational diabetes in pregnancy,             O24.415  controlled by oral hypoglycemic drugs  Hypertension - Chronic/Pre-existing            O10.019  (amlodipine)  [redacted] weeks gestation of pregnancy                Z3A.33  Cervical cerclage suture present, third        O34.33  trimester  Poor obstetric history: Previous               O09.299  preeclampsia / eclampsia/gestational HTN  History of cesarean delivery, currently        O34.219  pregnant (VBAC x 2)  Poor obstetric history: Previous preterm       O09.219  delivery, antepartum (36 weeks)  Obesity complicating pregnancy, third          O99.213  trimester 34  Uterine fibroids affecting pregnancy in third  O34.13, D25.9  trimester, antepartum ---------------------------------------------------------------------- Fetal Evaluation  Num Of Fetuses:         1  Fetal Heart Rate(bpm):  148  Cardiac Activity:       Observed  Presentation:           Cephalic  Amniotic Fluid  AFI FV:      Within normal limits  AFI Sum(cm)     %Tile       Largest Pocket(cm)  15.8            57           4.7  RUQ(cm)       RLQ(cm)       LUQ(cm)        LLQ(cm)  4.7           2.4           4.2            4.5 ---------------------------------------------------------------------- Biophysical Evaluation  Amniotic F.V:   Within normal limits       F. Tone:        Observed  F. Movement:    Observed                   Score:          8/8  F. Breathing:   Observed ---------------------------------------------------------------------- OB History  Gravidity:    4  Living:       3 ---------------------------------------------------------------------- Gestational Age  LMP:           33w 0d        Date:  04/10/21                 EDD:   01/15/22  Best:          33w 0d     Det. By:  LMP  (04/10/21)          EDD:   01/15/22 ---------------------------------------------------------------------- Anatomy  Stomach:               Appears normal, left   Bladder:                Appears normal                         sided  Kidneys:  Appear normal ---------------------------------------------------------------------- Comments  This patient was seen for a BPP due to chronic hypertension  treated with amlodipine and gestational diabetes treated with  metformin.  She denies any problems since her last exam.  A biophysical profile performed today was 8 out of 8.  There was normal amniotic fluid noted on today's ultrasound  exam.  She will return in 1 week for another BPP ----------------------------------------------------------------------                   Johnell Comings, MD Electronically Signed Final Report   11/27/2021 03:12 pm ----------------------------------------------------------------------  [place] MFM OB FOLLOW UP  Result Date: 12/20/2021 ----------------------------------------------------------------------  OBSTETRICS REPORT                       (Signed Final 12/20/2021 04:55 pm) ---------------------------------------------------------------------- Patient Info  ID #:       [id number]                           D.O.B.:  [birth year] (40 yrs)  Name:       Bonnie Morales             Visit Date: 12/20/2021 02:55 pm ---------------------------------------------------------------------- Performed By  Attending:        Sander Nephew      Ref. Address:     [place], [place]  Performed By:     Dorena Dew     [facility]                    BS, RDMS                                 Fetal Care at                                                             [place] for  Women  Referred By:      Chancy Milroy                    MD ---------------------------------------------------------------------- Orders  #  Description                           Code        Ordered By  1  [place] MFM OB FOLLOW UP                   76816.01    RAVI SHANKAR  2  [place] MFM FETAL BPP                      05397.6     RAVI [facility]     W/NONSTRESS ----------------------------------------------------------------------  #  Order #                     Accession #                Episode #  1  [id number]                   [id number]                 [id number]  2  [id number]                   [id number]                 [id number] ---------------------------------------------------------------------- Indications  Pre-existing essential hypertension            D14.970  complicating pregnancy, third trimester  [redacted] weeks gestation of pregnancy                Z3A.36  Gestational diabetes in pregnancy,             O24.415  controlled by oral hypoglycemic drugs  Pre-existing essential hypertension            Y63.785  complicating pregnancy, third trimester  Cervical cerclage suture present, third        O34.33  trimester  Poor obstetric history: Previous                O09.299  preeclampsia / eclampsia/gestational  [phone]  History of cesarean delivery, currently        O34.219  pregnant (VBAC x 2)  Uterine fibroids affecting pregnancy in third  O34.13, D25.9  trimester, antepartum ---------------------------------------------------------------------- Fetal Evaluation  Num Of Fetuses:         1  Fetal Heart Rate(bpm):  152  Cardiac Activity:       Observed  Presentation:           Cephalic  Placenta:               Posterior  P. Cord Insertion:      Previously Visualized  Amniotic Fluid  AFI FV:      Within normal limits  AFI Sum(cm)     %Tile       Largest Pocket(cm)  17.4            65          5.9  RUQ(cm)       RLQ(cm)       LUQ(cm)        LLQ(cm)  3.7           5.2  2.6            5.9 ---------------------------------------------------------------------- Biophysical Evaluation  Amniotic F.V:   Within normal limits       F. Tone:        Observed  F. Movement:    Observed                   N.S.T:          Reactive  F. Breathing:   Not Observed               Score:          8/10 ---------------------------------------------------------------------- Biometry  BPD:      93.5  mm     G. Age:  38w 0d         94  %    CI:        82.37   %    70 - 86                                                          FL/HC:      20.5   %    20.1 - 22.1  HC:       325   mm     G. Age:  36w 6d         33  %    HC/AC:      1.01        0.93 - 1.11  AC:      320.8  mm     G. Age:  36w 0d         53  %    FL/BPD:     71.2   %    71 - 87  FL:       66.6  mm     G. Age:  34w 2d          7  %    FL/AC:      20.8   %    20 - 24  HUM:      59.4  mm     G. Age:  34w 3d         32  %  LV:        6.2  mm  Est. FW:    2785  gm      6 lb 2 oz     40  % ---------------------------------------------------------------------- OB History  Gravidity:    4  Living:       3 ---------------------------------------------------------------------- Gestational Age  LMP:           36w 2d        Date:   04/10/21                 EDD:   01/15/22  U/S Today:     36w 2d                                        EDD:   01/15/22  Best:          36w 2d     Det. By:  LMP  (04/10/21)  EDD:   01/15/22 ---------------------------------------------------------------------- Anatomy  Cranium:               Appears normal         Aortic Arch:            Previously seen  Cavum:                 Previously seen        Ductal Arch:            Previously seen  Ventricles:            Appears normal         Diaphragm:              Appears normal  Choroid Plexus:        Previously seen        Stomach:                Appears normal, left                                                                        sided  Cerebellum:            Previously seen        Abdomen:                Previously seen  Posterior Fossa:       Previously seen        Abdominal Wall:         Previously seen  Nuchal Fold:           Not applicable (>63    Cord Vessels:           Previously seen                         wks GA)  Face:                  Orbits and profile     Kidneys:                Appear normal                         previously seen  Lips:                  Previously seen        Bladder:                Appears normal  Thoracic:              Previously seen        Spine:                  Previously seen  Heart:                 Appears normal         Upper Extremities:      Previously seen                         (4CH, axis, and  situs)  RVOT:                  Previously seen        Lower Extremities:      Previously seen  LVOT:                  Previously seen  Other:  Female gender previously seen. VC, 3VV and 3VTV previously          visualized. Technically difficult due to maternal habitus ---------------------------------------------------------------------- Cervix Uterus Adnexa  Cervix  Not visualized (advanced GA >24wks) ---------------------------------------------------------------------- Impression  Follow up  growth due to A2GDM on oral therapy.  She has a cerclage in place with plans for removal at her next  appointment.  Normal interval growth with measurements consistent with  dates  Good fetal movement and amniotic fluid volume  Biophysical profile 8/10  She reports good fetal movement.  She has known hypertension with blood pressure 131/68  mmHg. ---------------------------------------------------------------------- Recommendations  She is scheduled for delivery at 37 weeks  Continue daily kick counts. ----------------------------------------------------------------------               Sander Nephew, MD Electronically Signed Final Report   12/20/2021 04:55 pm ----------------------------------------------------------------------  US FETAL BPP W/NONSTRESS  Result Date: 12/15/2021 ----------------------------------------------------------------------  OBSTETRICS REPORT                        (Signed Final 12/15/2021 12:17 pm) ---------------------------------------------------------------------- Patient Info  ID #:       [id number]                          D.O.B.:  1981-05-22 (40 yrs)  Name:       SHEPHANIE ROMAS Morales             Visit Date: 12/14/2021 11:30 am ---------------------------------------------------------------------- Performed By  Attending:        Arlina Robes MD       Ref. Address:      [place]                                                              [place], [place]  Performed By:     Derinda Late RN      [facility]                                                              [facility] [facility] for                                                              Women  Referred By:      Chancy Milroy                     MD ---------------------------------------------------------------------- Orders  #  Description                           Code        Ordered By  1  US FETAL BPP W/NONSTRESS              14481.8     Arlina Robes ----------------------------------------------------------------------  #  Order #                     Accession #                Episode #  1  [id number]                   [id number]                 [id number] ---------------------------------------------------------------------- Indications  [redacted] weeks gestation of pregnancy                 Z3A.35  Pre-existing essential hypertension             V67.209  complicating pregnancy, third trimester  Gestational diabetes in pregnancy,              O24.415  controlled by oral hypoglycemic drugs ---------------------------------------------------------------------- Fetal Evaluation  Num Of Fetuses:          1  Preg. Location:          Intrauterine  Cardiac Activity:        Observed  Fetal Lie:               Maternal right side  Presentation:            Cephalic  Amniotic Fluid  AFI FV:      Within normal limits  AFI Sum(cm)     %Tile       Largest Pocket(cm)  13.82           49          4.44  RUQ(cm)       RLQ(cm)       LUQ(cm)        LLQ(cm)  4.44          4.13          3.07           2.18  Comment:    Breathing noted intermittently, but not sustained. ---------------------------------------------------------------------- Biophysical Evaluation  Amniotic F.V:   Pocket => 2 cm             F. Tone:         Observed  F. Movement:    Observed                   N.S.T:           Reactive  F. Breathing:   Not Observed               Score:           8/10 ---------------------------------------------------------------------- OB History  Gravidity:    4  Living:       3 ---------------------------------------------------------------------- Gestational Age  LMP:           35w 3d        Date:  04/10/21                 EDD:   01/15/22  Best:          Barbie Haggis 3d      Det. By:  LMP  (04/10/21)          EDD:   01/15/22 ---------------------------------------------------------------------- Impression  BPP 8/10 (-2 for NST)  Vertex ---------------------------------------------------------------------- Recommendations  Contiue with antenatal testing as indicated ----------------------------------------------------------------------                  Arlina Robes, MD Electronically Signed Final Report   12/15/2021 12:17 pm ----------------------------------------------------------------------  US FETAL BPP W/NONSTRESS  Result Date: 12/08/2021 ----------------------------------------------------------------------  OBSTETRICS REPORT                        (Signed Final 12/08/2021 02:17 pm) ---------------------------------------------------------------------- Patient Info  ID #:       [id number]                          D.O.B.:  May 21, 1981 (40 yrs)  Name:       Bonnie Morales             Visit Date: 12/06/2021 08:40 am ---------------------------------------------------------------------- Performed By  Attending:        Clayton Lefort MD     Ref. Address:      [place], [place]  [place]  Performed By:     Derinda Late RN      Location:          [facility]                                                              [place] [facility]                                                              [place] for                                                              Women  Referred By:      Chancy [place]                    MD ---------------------------------------------------------------------- Orders  #  Description                           Code        Ordered By  1  US FETAL BPP W/NONSTRESS              41324.4      Clayton Lefort ----------------------------------------------------------------------  #  Order #                     Accession #                Episode #  1  [id number]                   [id number]                 [id number] ---------------------------------------------------------------------- Indications  [redacted] weeks gestation of pregnancy                 Z3A.34  Pre-existing essential hypertension             P32.951  complicating pregnancy, third trimester  Gestational diabetes in pregnancy,              O24.415  controlled by oral hypoglycemic drugs ---------------------------------------------------------------------- Fetal Evaluation  Num Of Fetuses:          1  Preg. Location:          Intrauterine  Cardiac Activity:        Observed  Fetal Lie:               Maternal left side  Presentation:  Cephalic  Amniotic Fluid  AFI FV:      Within normal limits  AFI Sum(cm)     %Tile       Largest Pocket(cm)  15.24           55          5.98  RUQ(cm)       RLQ(cm)       LUQ(cm)        LLQ(cm)  2.28          5.98          2.02           4.96 ---------------------------------------------------------------------- Biophysical Evaluation  Amniotic F.V:   Pocket => 2 cm             F. Tone:         Observed  F. Movement:    Observed                   N.S.T:           Reactive  F. Breathing:   Observed                   Score:           10/10 ---------------------------------------------------------------------- OB History  Gravidity:    4  Living:       3 ---------------------------------------------------------------------- Gestational Age  LMP:           34w 2d        Date:  04/10/21                 EDD:   01/15/22  Best:          34w 2d     Det. By:  LMP  (04/10/21)          EDD:   01/15/22 ---------------------------------------------------------------------- Impression  Antenatal testing due to chronic hypertension on medications  and gestational diabetes on medications.  Testing is reassuring, BPP 10/10.  ---------------------------------------------------------------------- Recommendations  Continue weekly antenatal testing till delivery . ----------------------------------------------------------------------                Clayton Lefort, MD Electronically Signed Final Report   12/08/2021 02:17 pm ----------------------------------------------------------------------   Assessment and Plan:  Pregnancy: [id number] at [redacted]w[redacted]d 1. Oral hypoglycemic controlled White classification A2 gestational diabetes mellitus (GDM) Did not bring log but reports good blood sugars. Continue Metformin. Continue scans and antenatal testing as per MFM. IOL recommended this week by MFM, this was scheduled on 12/28/21.  Orders placed.  2. Chronic hypertension Stable BP on Amlodipine. IOL this week.  3. Cervical cerclage suture present in third trimester 4. History of cervical incompetence Cerclage removed  5. [redacted] weeks gestation of pregnancy 6. Supervision of high risk pregnancy, antepartum Pelvic cultures done today, will follow up results and manage accordingly. - Culture, beta strep (group b only) - GC/Chlamydia probe amp ([place])not at  [facility] Term labor symptoms and general obstetric precautions including but not limited to vaginal bleeding, contractions, leaking of fluid and fetal movement were reviewed in detail with the patient. Please refer to After Visit Summary for other counseling recommendations.   Return in about 2 weeks (around 01/08/2022) for Postpartum  BP check    4 to 5 weeks after delivery : PP check.  Future Appointments  Date Time Provider [place]  12/28/2021 12:00 AM [facility] SCHED ROOM [facility] None  01/11/2022  8:30 AM [facility] NURSE [facility]    Sofhia Ulibarri,  MD

## 2021-12-27 ENCOUNTER — Other Ambulatory Visit: Payer: Self-pay | Admitting: Advanced Practice Midwife

## 2021-12-27 LAB — GC/CHLAMYDIA PROBE AMP (~~LOC~~) NOT AT ARMC
Chlamydia: NEGATIVE
Comment: NEGATIVE
Comment: NORMAL
Neisseria Gonorrhea: NEGATIVE

## 2021-12-28 ENCOUNTER — Other Ambulatory Visit: Payer: Self-pay

## 2021-12-28 ENCOUNTER — Inpatient Hospital Stay (HOSPITAL_COMMUNITY)
Admission: AD | Admit: 2021-12-28 | Discharge: 2021-12-30 | DRG: 806 | Disposition: A | Discharge status: Home / Self Care | Payer: Medicaid Other | Attending: Obstetrics and Gynecology | Admitting: Obstetrics and Gynecology

## 2021-12-28 ENCOUNTER — Inpatient Hospital Stay (HOSPITAL_COMMUNITY): Payer: Medicaid Other

## 2021-12-28 ENCOUNTER — Inpatient Hospital Stay (HOSPITAL_COMMUNITY): Payer: Medicaid Other | Admitting: Anesthesiology

## 2021-12-28 ENCOUNTER — Encounter (HOSPITAL_COMMUNITY): Payer: Self-pay | Admitting: Obstetrics and Gynecology

## 2021-12-28 DIAGNOSIS — O34219 Maternal care for unspecified type scar from previous cesarean delivery: Secondary | ICD-10-CM | POA: Diagnosis present

## 2021-12-28 DIAGNOSIS — Z789 Other specified health status: Secondary | ICD-10-CM | POA: Diagnosis present

## 2021-12-28 DIAGNOSIS — Z98891 History of uterine scar from previous surgery: Secondary | ICD-10-CM

## 2021-12-28 DIAGNOSIS — Z8759 Personal history of other complications of pregnancy, childbirth and the puerperium: Secondary | ICD-10-CM

## 2021-12-28 DIAGNOSIS — Z7982 Long term (current) use of aspirin: Secondary | ICD-10-CM | POA: Diagnosis not present

## 2021-12-28 DIAGNOSIS — O24415 Gestational diabetes mellitus in pregnancy, controlled by oral hypoglycemic drugs: Secondary | ICD-10-CM | POA: Diagnosis present

## 2021-12-28 DIAGNOSIS — Z87891 Personal history of nicotine dependence: Secondary | ICD-10-CM | POA: Diagnosis not present

## 2021-12-28 DIAGNOSIS — Z8742 Personal history of other diseases of the female genital tract: Secondary | ICD-10-CM

## 2021-12-28 DIAGNOSIS — O24425 Gestational diabetes mellitus in childbirth, controlled by oral hypoglycemic drugs: Secondary | ICD-10-CM | POA: Diagnosis present

## 2021-12-28 DIAGNOSIS — O1002 Pre-existing essential hypertension complicating childbirth: Principal | ICD-10-CM | POA: Diagnosis present

## 2021-12-28 DIAGNOSIS — O09529 Supervision of elderly multigravida, unspecified trimester: Secondary | ICD-10-CM

## 2021-12-28 DIAGNOSIS — O9081 Anemia of the puerperium: Secondary | ICD-10-CM | POA: Diagnosis not present

## 2021-12-28 DIAGNOSIS — Z20822 Contact with and (suspected) exposure to covid-19: Secondary | ICD-10-CM | POA: Diagnosis present

## 2021-12-28 DIAGNOSIS — O24424 Gestational diabetes mellitus in childbirth, insulin controlled: Secondary | ICD-10-CM

## 2021-12-28 DIAGNOSIS — O10919 Unspecified pre-existing hypertension complicating pregnancy, unspecified trimester: Secondary | ICD-10-CM | POA: Diagnosis present

## 2021-12-28 DIAGNOSIS — Z3A37 37 weeks gestation of pregnancy: Secondary | ICD-10-CM

## 2021-12-28 DIAGNOSIS — D62 Acute posthemorrhagic anemia: Secondary | ICD-10-CM | POA: Diagnosis not present

## 2021-12-28 DIAGNOSIS — O34211 Maternal care for low transverse scar from previous cesarean delivery: Secondary | ICD-10-CM

## 2021-12-28 DIAGNOSIS — O99820 Streptococcus B carrier state complicating pregnancy: Secondary | ICD-10-CM

## 2021-12-28 DIAGNOSIS — O0990 Supervision of high risk pregnancy, unspecified, unspecified trimester: Secondary | ICD-10-CM

## 2021-12-28 HISTORY — DX: Supervision of elderly multigravida, unspecified trimester: O09.529

## 2021-12-28 LAB — TYPE AND SCREEN
ABO/RH(D): O POS
Antibody Screen: NEGATIVE

## 2021-12-28 LAB — CBC
HCT: 35.7 % — ABNORMAL LOW (ref 36.0–46.0)
Hemoglobin: 12 g/dL (ref 12.0–15.0)
MCH: 29.6 pg (ref 26.0–34.0)
MCHC: 33.6 g/dL (ref 30.0–36.0)
MCV: 88.1 fL (ref 80.0–100.0)
Platelets: 253 10*3/uL (ref 150–400)
RBC: 4.05 MIL/uL (ref 3.87–5.11)
RDW: 13.9 % (ref 11.5–15.5)
WBC: 9 10*3/uL (ref 4.0–10.5)
nRBC: 0 % (ref 0.0–0.2)

## 2021-12-28 LAB — GLUCOSE, CAPILLARY
Glucose-Capillary: 97 mg/dL (ref 70–99)
Glucose-Capillary: 78 mg/dL (ref 70–99)
Glucose-Capillary: 82 mg/dL (ref 70–99)

## 2021-12-28 LAB — COMPREHENSIVE METABOLIC PANEL
ALT: 11 U/L (ref 0–44)
AST: 19 U/L (ref 15–41)
Albumin: 2.8 g/dL — ABNORMAL LOW (ref 3.5–5.0)
Alkaline Phosphatase: 134 U/L — ABNORMAL HIGH (ref 38–126)
Anion gap: 11 (ref 5–15)
BUN: 11 mg/dL (ref 6–20)
CO2: 19 mmol/L — ABNORMAL LOW (ref 22–32)
Calcium: 8.6 mg/dL — ABNORMAL LOW (ref 8.9–10.3)
Chloride: 106 mmol/L (ref 98–111)
Creatinine, Ser: 0.54 mg/dL (ref 0.44–1.00)
GFR, Estimated: >60 mL/min (ref >60)
Glucose, Bld: 102 mg/dL — ABNORMAL HIGH (ref 70–99)
Potassium: 3.4 mmol/L — ABNORMAL LOW (ref 3.5–5.1)
Sodium: 136 mmol/L (ref 135–145)
Total Bilirubin: 0.4 mg/dL (ref 0.3–1.2)
Total Protein: 6.5 g/dL (ref 6.5–8.1)

## 2021-12-28 LAB — RESP PANEL BY RT-PCR (FLU A&B, COVID) ARPGX2
Influenza A by PCR: NEGATIVE
Influenza B by PCR: NEGATIVE
SARS Coronavirus 2 by RT PCR: NEGATIVE

## 2021-12-28 LAB — GROUP B STREP BY PCR: Group B strep by PCR: POSITIVE — AB

## 2021-12-28 LAB — RPR: RPR Ser Ql: NONREACTIVE

## 2021-12-28 MED ORDER — SODIUM CHLORIDE 0.9 % IV SOLN
5.0000 10*6.[IU] | Freq: Once | INTRAVENOUS | Status: AC
  Administered 2021-12-28: 5 10*6.[IU] via INTRAVENOUS
  Filled 2021-12-28: qty 5

## 2021-12-28 MED ORDER — DIBUCAINE (PERIANAL) 1 % EX OINT: 1.0000 "application " | TOPICAL_OINTMENT | CUTANEOUS | Status: DC | PRN

## 2021-12-28 MED ORDER — PRENATAL MULTIVITAMIN CH
1.0000 | ORAL_TABLET | Freq: Every day | ORAL | Status: DC
  Administered 2021-12-29 – 2021-12-30 (×2): 1 via ORAL
  Filled 2021-12-28 (×2): qty 1

## 2021-12-28 MED ORDER — SIMETHICONE 80 MG PO CHEW: 80.0000 mg | CHEWABLE_TABLET | ORAL | Status: DC | PRN

## 2021-12-28 MED ORDER — OXYTOCIN-SODIUM CHLORIDE 30-0.9 UT/500ML-% IV SOLN
1.0000 m[IU]/min | INTRAVENOUS | Status: DC
  Administered 2021-12-28: 2 m[IU]/min via INTRAVENOUS
  Filled 2021-12-28: qty 500

## 2021-12-28 MED ORDER — FENTANYL CITRATE (PF) 100 MCG/2ML IJ SOLN
50.0000 ug | INTRAMUSCULAR | Status: DC | PRN
  Filled 2021-12-28: qty 2

## 2021-12-28 MED ORDER — DIPHENHYDRAMINE HCL 50 MG/ML IJ SOLN: 12.5000 mg | INTRAMUSCULAR | Status: DC | PRN

## 2021-12-28 MED ORDER — LIDOCAINE HCL (PF) 1 % IJ SOLN
INTRAMUSCULAR | Status: DC | PRN
  Administered 2021-12-28: 2 mL via EPIDURAL
  Administered 2021-12-28: 10 mL via EPIDURAL

## 2021-12-28 MED ORDER — OXYTOCIN BOLUS FROM INFUSION
333.0000 mL | Freq: Once | INTRAVENOUS | Status: AC
  Administered 2021-12-28: 333 mL via INTRAVENOUS

## 2021-12-28 MED ORDER — FENTANYL-BUPIVACAINE-NACL 0.5-0.125-0.9 MG/250ML-% EP SOLN
EPIDURAL | Status: DC | PRN
  Administered 2021-12-28: 12 mL/h via EPIDURAL

## 2021-12-28 MED ORDER — HYDROXYZINE HCL 50 MG PO TABS: 50.0000 mg | ORAL_TABLET | Freq: Four times a day (QID) | ORAL | Status: DC | PRN

## 2021-12-28 MED ORDER — EPHEDRINE 5 MG/ML INJ: 10.0000 mg | INTRAVENOUS | Status: DC | PRN

## 2021-12-28 MED ORDER — MISOPROSTOL 200 MCG PO TABS
ORAL_TABLET | ORAL | Status: AC
  Filled 2021-12-28: qty 4

## 2021-12-28 MED ORDER — DIPHENHYDRAMINE HCL 25 MG PO CAPS: 25.0000 mg | ORAL_CAPSULE | Freq: Four times a day (QID) | ORAL | Status: DC | PRN

## 2021-12-28 MED ORDER — ONDANSETRON HCL 4 MG/2ML IJ SOLN: 4.0000 mg | Freq: Four times a day (QID) | INTRAMUSCULAR | Status: DC | PRN

## 2021-12-28 MED ORDER — LACTATED RINGERS IV SOLN
500.0000 mL | Freq: Once | INTRAVENOUS | Status: AC
  Administered 2021-12-28: 500 mL via INTRAVENOUS

## 2021-12-28 MED ORDER — PHENYLEPHRINE 40 MCG/ML (10ML) SYRINGE FOR IV PUSH (FOR BLOOD PRESSURE SUPPORT)
80.0000 ug | PREFILLED_SYRINGE | INTRAVENOUS | Status: DC | PRN
  Filled 2021-12-28: qty 10

## 2021-12-28 MED ORDER — TETANUS-DIPHTH-ACELL PERTUSSIS 5-2.5-18.5 LF-MCG/0.5 IM SUSY: 0.5000 mL | PREFILLED_SYRINGE | Freq: Once | INTRAMUSCULAR | Status: DC

## 2021-12-28 MED ORDER — FLEET ENEMA 7-19 GM/118ML RE ENEM: 1.0000 | ENEMA | Freq: Every day | RECTAL | Status: DC | PRN

## 2021-12-28 MED ORDER — OXYCODONE-ACETAMINOPHEN 5-325 MG PO TABS: 2.0000 | ORAL_TABLET | ORAL | Status: DC | PRN

## 2021-12-28 MED ORDER — SENNOSIDES-DOCUSATE SODIUM 8.6-50 MG PO TABS
2.0000 | ORAL_TABLET | Freq: Every day | ORAL | Status: DC
  Administered 2021-12-29 – 2021-12-30 (×2): 2 via ORAL
  Filled 2021-12-28 (×2): qty 2

## 2021-12-28 MED ORDER — AMLODIPINE BESYLATE 5 MG PO TABS
10.0000 mg | ORAL_TABLET | Freq: Every day | ORAL | Status: DC
  Administered 2021-12-28: 10 mg via ORAL
  Filled 2021-12-28: qty 1

## 2021-12-28 MED ORDER — MISOPROSTOL 200 MCG PO TABS
800.0000 ug | ORAL_TABLET | Freq: Once | ORAL | Status: AC
  Administered 2021-12-28: 800 ug via RECTAL

## 2021-12-28 MED ORDER — TRANEXAMIC ACID-NACL 1000-0.7 MG/100ML-% IV SOLN
INTRAVENOUS | Status: AC
  Filled 2021-12-28: qty 100

## 2021-12-28 MED ORDER — LACTATED RINGERS IV SOLN: 500.0000 mL | INTRAVENOUS | Status: DC | PRN

## 2021-12-28 MED ORDER — TRANEXAMIC ACID-NACL 1000-0.7 MG/100ML-% IV SOLN
1000.0000 mg | INTRAVENOUS | Status: AC
  Administered 2021-12-28: 1000 mg via INTRAVENOUS

## 2021-12-28 MED ORDER — ONDANSETRON HCL 4 MG/2ML IJ SOLN: 4.0000 mg | INTRAMUSCULAR | Status: DC | PRN

## 2021-12-28 MED ORDER — PENICILLIN G POT IN DEXTROSE 60000 UNIT/ML IV SOLN
3.0000 10*6.[IU] | INTRAVENOUS | Status: DC
  Administered 2021-12-28: 3 10*6.[IU] via INTRAVENOUS
  Filled 2021-12-28 (×5): qty 50

## 2021-12-28 MED ORDER — BENZOCAINE-MENTHOL 20-0.5 % EX AERO
1.0000 "application " | INHALATION_SPRAY | CUTANEOUS | Status: DC | PRN
  Administered 2021-12-28: 1 via TOPICAL
  Filled 2021-12-28: qty 56

## 2021-12-28 MED ORDER — ZOLPIDEM TARTRATE 5 MG PO TABS: 5.0000 mg | ORAL_TABLET | Freq: Every evening | ORAL | Status: DC | PRN

## 2021-12-28 MED ORDER — SOD CITRATE-CITRIC ACID 500-334 MG/5ML PO SOLN: 30.0000 mL | ORAL | Status: DC | PRN

## 2021-12-28 MED ORDER — ONDANSETRON HCL 4 MG PO TABS: 4.0000 mg | ORAL_TABLET | ORAL | Status: DC | PRN

## 2021-12-28 MED ORDER — ACETAMINOPHEN 325 MG PO TABS
650.0000 mg | ORAL_TABLET | ORAL | Status: DC | PRN
  Administered 2021-12-28 – 2021-12-29 (×2): 650 mg via ORAL
  Filled 2021-12-28 (×2): qty 2

## 2021-12-28 MED ORDER — IBUPROFEN 600 MG PO TABS
600.0000 mg | ORAL_TABLET | Freq: Four times a day (QID) | ORAL | Status: DC
  Administered 2021-12-28 – 2021-12-30 (×8): 600 mg via ORAL
  Filled 2021-12-28 (×8): qty 1

## 2021-12-28 MED ORDER — OXYCODONE-ACETAMINOPHEN 5-325 MG PO TABS: 1.0000 | ORAL_TABLET | ORAL | Status: DC | PRN

## 2021-12-28 MED ORDER — COCONUT OIL OIL: 1.0000 "application " | TOPICAL_OIL | Status: DC | PRN

## 2021-12-28 MED ORDER — TERBUTALINE SULFATE 1 MG/ML IJ SOLN: 0.2500 mg | Freq: Once | INTRAMUSCULAR | Status: DC | PRN

## 2021-12-28 MED ORDER — FENTANYL-BUPIVACAINE-NACL 0.5-0.125-0.9 MG/250ML-% EP SOLN
12.0000 mL/h | EPIDURAL | Status: DC | PRN
  Filled 2021-12-28: qty 250

## 2021-12-28 MED ORDER — METHYLERGONOVINE MALEATE 0.2 MG/ML IJ SOLN
0.2000 mg | Freq: Once | INTRAMUSCULAR | Status: AC
  Administered 2021-12-28: 0.2 mg via INTRAMUSCULAR
  Filled 2021-12-28: qty 1

## 2021-12-28 MED ORDER — WITCH HAZEL-GLYCERIN EX PADS: 1.0000 "application " | MEDICATED_PAD | CUTANEOUS | Status: DC | PRN

## 2021-12-28 MED ORDER — ACETAMINOPHEN 325 MG PO TABS: 650.0000 mg | ORAL_TABLET | ORAL | Status: DC | PRN

## 2021-12-28 MED ORDER — OXYTOCIN-SODIUM CHLORIDE 30-0.9 UT/500ML-% IV SOLN: 2.5000 [IU]/h | INTRAVENOUS | Status: DC

## 2021-12-28 MED ORDER — LIDOCAINE HCL (PF) 1 % IJ SOLN: 30.0000 mL | INTRAMUSCULAR | Status: DC | PRN

## 2021-12-28 MED ORDER — LACTATED RINGERS IV SOLN: INTRAVENOUS | Status: DC

## 2021-12-28 MED ORDER — PHENYLEPHRINE 40 MCG/ML (10ML) SYRINGE FOR IV PUSH (FOR BLOOD PRESSURE SUPPORT): 80.0000 ug | PREFILLED_SYRINGE | INTRAVENOUS | Status: DC | PRN

## 2021-12-28 NOTE — Plan of Care (Signed)
Gilmer Kaminsky, RN 

## 2021-12-28 NOTE — Progress Notes (Signed)
S: Patient states contractions are getting more intense and closer together. Still coping right now and does not want anything for pain. Has no other questions/concerns at this time. Electronic interpreter utilized due to language barrier. Anticipating the arrival of baby girl Itzel.   O: Vitals:   12/28/21 0400 12/28/21 0430 12/28/21 0500 12/28/21 0530  BP: 133/75 113/60 115/66 115/69  Pulse: 72 66 (!) 59 (!) 59  Resp: 16 15 16 16   Temp:      TempSrc:      Weight:      Height:        FHT:  FHR: 140 bpm, variability: moderate,  accelerations:  Present,  decelerations:  Absent UC:   irregular, every 2.5-5 minutes SVE:   Dilation: 3.5 Effacement (%): 70 Station: -2 Exam by:: A. Andre Swander, student midwife  A / P: 41 y.o.G4P2103 [redacted]w[redacted]d here for IOL cHTN on Amlodipine and TOLAC successful VBAC x2, progressing normally on Pitocin  -GBS positive > discussed PCN every 4 hours until delivery and patient agrees with the plan -A2GDM- on Metformin, holding for now. CBG-78. Will continue to monitor Q4 hours -TOLAC- will continue Pitocin  Fetal Wellbeing:  Category I Pain Control:  Labor support without medications Anticipated MOD:  NSVD  Trenton Gammon 12/28/2021, 5:57 AM

## 2021-12-28 NOTE — H&P (Addendum)
OB ADMISSION/ HISTORY & PHYSICAL:  Admission Date: 12/28/2021 12:35 AM  Admit Diagnosis: Induction    Bonnie Morales is a 41 y.o. female at [redacted]w[redacted]d presenting for IOL for chronic HTN. Patient reports positive fetal movement, denies vaginal bleeding and LOF. Patient denies headache, vision changes, and RUQ pain. Patient expecting baby  Electronic interpreter utilized due to language barrier. Anticipating the arrival of baby girl. Significant other Luis at bedside and supprotive.   Prenatal History: [id number]   EDC : 01/15/2022, by Last Menstrual Period  [facility]  Prenatal course complicated by: -Chronic Hypertension -History of eclampsia (wit G1) -A2GDM -History of incompetent cervix- cerclage placed 07/25/21 -Prior cesarean section with VBAC x 2, planning to [place] (consent under media 11/27/21) -Language barrier- Spanish speaking  Prenatal Labs: Nursing Staff Provider  Office Location [facility] Dating  5wk [place]  Language  Spanish Anatomy US  normal  Flu Vaccine   at HD? Genetic/Carrier Screen  NIPS:    AFP:   normal Horizon: neg 4/4  TDaP Vaccine   at HD? Hgb A1C or  GTT Early - 5.8%, failed 2hr GTT Third trimester   COVID Vaccine     LAB RESULTS   Rhogam  N/A Blood Type O/Positive/-- (08/10 1114)   Baby Feeding Plan Both, Bottle &Breast Antibody Negative (08/10 1114)  Contraception Undecided Rubella 9.83 (08/10 1114)  Circumcision If boy, No RPR Non Reactive (08/10 1114)   Pediatrician    HBsAg Negative (08/10 1114)   Support Person FOB HCVAb neg  Prenatal Classes   HIV Non Reactive (08/10 1114)     BTL Consent   GBS   (For PCN allergy, check sensitivities)   VBAC Consent @ 28 weeks Pap NILM, HPV neg 2020           DME Rx [X]  BP cuff [ ]  Weight Scale Waterbirth  [ ]  Class [ ]  Consent [ ]  CNM visit  PHQ9 & GAD7 [  ] new OB [X]  28 weeks  [  ] 36 weeks Induction  [ ]  Orders Entered [ ] Foley Y/N   Medical / Surgical History : Past medical history:  Past Medical  History:  Diagnosis Date   Chronic hypertension    Gestational diabetes    History of kidney stones    passed stone   Hypertension    2014   Pre-eclampsia     Past surgical history:  Past Surgical History:  Procedure Laterality Date   CERVICAL CERCLAGE N/A 04/12/2017   Procedure: CERCLAGE CERVICAL;  Surgeon: Chancy Milroy, MD;  Location: [facility];  Service: Gynecology;  Laterality: N/A;   CERVICAL CERCLAGE N/A 07/25/2021   Procedure: CERCLAGE CERVICAL;  Surgeon: Chancy Milroy, MD;  Location: [facility];  Service: Gynecology;  Laterality: N/A;   CESAREAN SECTION     epidural - in [place]    Family History:  Family History  Problem Relation Age of Onset   Diabetes Mother    Hypertension Mother    Heart disease Mother        tachycardia   Hypertension Father    Mental retardation Sister    Cancer Paternal Grandmother     Social History:  reports that she quit smoking about 5 years ago. Her smoking use included cigarettes. She has a 1.25 pack-year smoking history. She has never used smokeless tobacco. She reports that she does not drink alcohol and does not use drugs. Allergies: Latex and Sulfa antibiotics   Current Medications at time  of admission:  Medications Prior to Admission  Medication Sig Dispense Refill Last Dose   acetaminophen (TYLENOL) 325 MG tablet Take 2 tablets (650 mg total) by mouth every 4 (four) hours as needed for moderate pain. 30 tablet 0    amLODipine (NORVASC) 10 MG tablet Take 1 tablet (10 mg total) by mouth daily. 90 tablet 3    aspirin 81 MG tablet Take 81 mg by mouth daily.      metFORMIN (GLUCOPHAGE) 500 MG tablet Take 1 tablet (500 mg total) by mouth 2 (two) times daily with a meal. Start with one at night for 3-5 days. 60 tablet 0    Prenatal Vit-Fe Fumarate-FA (PRENATAL MULTIVITAMIN) TABS tablet Take 1 tablet by mouth daily.      progesterone (PROMETRIUM) 200 MG capsule Place 1 capsule (200 mg total) vaginally daily. 30 capsule 8      Review of  Systems: Review of Systems  Constitutional: Negative.   HENT: Negative.    Eyes: Negative.   Respiratory: Negative.    Cardiovascular: Negative.   Neurological: Negative.   Psychiatric/Behavioral: Negative.     Physical Exam: Vital signs and nursing notes reviewed.  Patient Vitals for the past 24 hrs:  Height Weight  12/28/21 0040 5\' 5"  (1.651 m) 93.5 kg     General: AAO x 3, NAD Heart: RRR Lungs:CTAB Abdomen: Gravid, NT Extremities: no edema Genitalia /  VE:   2cm/60/-3 Alexis White, SNM   FHR: 140 BPM, moderate variability, present accels, absent decels TOCO: Rare contractions   Labs:   Pending T&S, CBC, RPR  No results for input(s): WBC, HGB, HCT, PLT in the last 72 hours.  Assessment:  41 y.o. U3A4536 at [redacted]w[redacted]d here for IOL for Chronic HTN.   1. 1st stage of labor 2. FHR category 1 3. GBS unknown, PCR pending 4. Desires epidural 5. Plans to breast and bottle feed 6. TOLAC- Hx VBAC x2 7. A2GDM- on Metformin 8. Chronic HTN- on Amlodipine 5mg  9. History of cervical incompetence- cerclage removed   Plan:  1. Admit to BS 2. Routine L&D orders 3. Analgesia/anesthesia PRN  4. Low dose Pitocin 5. Anticipate NSVB 6. Planning BTL prior to discharge     Trenton Gammon 12/28/2021, 12:50 AM   CNM attestation:  I have seen and examined this patient; I agree with above documentation in the nurse midwife's note.   Bonnie Morales is a 41 y.o. [phone] here for IOL due to cHTN; also with GDMA2, hx C/S with VBAC x 2, hx eclampsia  PE: BP 130/74    Pulse 82    Temp 97.6 F (36.4 C) (Oral)    Resp 15    Ht 5\' 5"  (1.651 m)    Wt 93.5 kg    LMP 04/10/2021 (Exact Date)    BMI 34.30 kg/m  Gen: calm comfortable, NAD Resp: normal effort, no distress Abd: gravid  ROS, labs, PMH reviewed  Plan: Admit to Labor and Delivery Plan to start with Pitocin as cx is ripe; consider AROM as labor progresses CMET neg for pre-e GBS PCR positive- PCN  started Anticipate successful [place] 12/28/2021, 7:30 AM

## 2021-12-28 NOTE — Progress Notes (Signed)
Bonnie Morales is a 41 y.o. [phone] at [redacted]w[redacted]d by LMP admitted for induction of labor due to Gestational diabetes and Hypertension.  Subjective: Pt doing well, continues to feel contractions, denies need for pain medication at this time. Agrees to AROM.   Objective: Pt in NAD.  BP 122/63    Pulse (!) 57    Temp 97.6 F (36.4 C) (Oral)    Resp 16    Ht 5\' 5"  (1.651 m)    Wt 93.5 kg    LMP 04/10/2021 (Exact Date)    BMI 34.30 kg/m  No intake/output data recorded. No intake/output data recorded.  FHT:  FHR: 140 bpm, variability: moderate,  accelerations:  Present,  decelerations:  Absent UC:   regular, every 2-3 minutes SVE:   Dilation: 4 Effacement (%): 60 Station: -2 Exam by:: Joslin Doell, SNM  Labs: Lab Results  Component Value Date   WBC 9.0 12/28/2021   HGB 12.0 12/28/2021   HCT 35.7 (L) 12/28/2021   MCV 88.1 12/28/2021   PLT 253 12/28/2021    Assessment / Plan: Latent labor: AROM: clear fluid, no cervical change.  Continue expectant management. Will reassess in 4 hours and consider low dose pitocin.  FWB: Cat 1.    Dayvion Sans 12/28/2021, 10:34 AM

## 2021-12-28 NOTE — Progress Notes (Signed)
Called to bedside to assess postpartum bleeding. Small amount oozing with small clots noted with fundal rub. Internal exam performed, boggy lower uterine segment noted. 800 mcg of rectal Cytotec and TXA given. Will monitor with next couple fundal checks on L&D. If improved, okay to transfer to postpartum floor. Interpreter services used for entirety of encounter via iPad [employer] interpreter.   -Vilma Meckel, MD

## 2021-12-28 NOTE — Anesthesia Procedure Notes (Signed)
Epidural Patient location during procedure: OB Start time: 12/28/2021 12:09 PM End time: 12/28/2021 12:17 PM  Staffing Anesthesiologist: Pervis Hocking, DO Performed: anesthesiologist   Preanesthetic Checklist Completed: patient identified, IV checked, risks and benefits discussed, monitors and equipment checked, pre-op evaluation and timeout performed  Epidural Patient position: sitting Prep: DuraPrep and site prepped and draped Patient monitoring: continuous pulse ox, blood pressure, heart rate and cardiac monitor Approach: midline Location: L3-L4 Injection technique: LOR air  Needle:  Needle type: Tuohy  Needle gauge: 17 G Needle length: 9 cm Needle insertion depth: 6 cm Catheter type: closed end flexible Catheter size: 19 Gauge Catheter at skin depth: 11 cm Test dose: negative  Assessment Sensory level: T8 Events: blood not aspirated, injection not painful, no injection resistance, no paresthesia and negative IV test  Additional Notes Patient identified. Risks/Benefits/Options discussed with patient including but not limited to bleeding, infection, nerve damage, paralysis, failed block, incomplete pain control, headache, blood pressure changes, nausea, vomiting, reactions to medication both or allergic, itching and postpartum back pain. Confirmed with bedside nurse the patient's most recent platelet count. Confirmed with patient that they are not currently taking any anticoagulation, have any bleeding history or any family history of bleeding disorders. Patient expressed understanding and wished to proceed. All questions were answered. Sterile technique was used throughout the entire procedure. Please see nursing notes for vital signs. Test dose was given through epidural catheter and negative prior to continuing to dose epidural or start infusion. Warning signs of high block given to the patient including shortness of breath, tingling/numbness in hands, complete motor  block, or any concerning symptoms with instructions to call for help. Patient was given instructions on fall risk and not to get out of bed. All questions and concerns addressed with instructions to call with any issues or inadequate analgesia.  Reason for block:procedure for pain

## 2021-12-28 NOTE — Anesthesia Preprocedure Evaluation (Signed)
Anesthesia Evaluation  Patient identified by MRN, date of birth, ID band Patient awake    Reviewed: Allergy & Precautions, Patient's Chart, lab work & pertinent test results  Airway Mallampati: II  TM Distance: >3 FB Neck ROM: Full    Dental no notable dental hx.    Pulmonary neg pulmonary ROS, former smoker,    Pulmonary exam normal breath sounds clear to auscultation       Cardiovascular hypertension (gestational on chronic HTN), Pt. on medications Normal cardiovascular exam Rhythm:Regular Rate:Normal     Neuro/Psych negative neurological ROS  negative psych ROS   GI/Hepatic negative GI ROS, Neg liver ROS,   Endo/Other  diabetes, Well Controlled, GestationalBMI 34  Renal/GU negative Renal ROS  negative genitourinary   Musculoskeletal negative musculoskeletal ROS (+)   Abdominal   Peds negative pediatric ROS (+)  Hematology negative hematology ROS (+) hct 35.7, plt 253   Anesthesia Other Findings   Reproductive/Obstetrics (+) Pregnancy                             Anesthesia Physical Anesthesia Plan  ASA: 3  Anesthesia Plan: Epidural   Post-op Pain Management:    Induction:   PONV Risk Score and Plan: 2  Airway Management Planned: Natural Airway  Additional Equipment: None  Intra-op Plan:   Post-operative Plan:   Informed Consent: I have reviewed the patients History and Physical, chart, labs and discussed the procedure including the risks, benefits and alternatives for the proposed anesthesia with the patient or authorized representative who has indicated his/her understanding and acceptance.       Plan Discussed with:   Anesthesia Plan Comments:         Anesthesia Quick Evaluation

## 2021-12-28 NOTE — Discharge Summary (Signed)
° °  Postpartum Discharge Summary °   °Patient Name: Bonnie Morales °DOB: 11/05/1981 °MRN: [id number] ° °Date of admission: 12/28/2021 °Delivery date:12/28/2021  °Delivering provider: WHITE, LAKASHA  °Date of discharge: 12/30/2021 ° °Admitting diagnosis: Chronic hypertension affecting pregnancy [O10.919] °Intrauterine pregnancy: [redacted]w[redacted]d     °Secondary diagnosis:  Principal Problem: °  Chronic hypertension affecting pregnancy °Active Problems: °  Vaginal birth after cesarean delivery °  History of VBAC °  History of eclampsia °  Language barrier °  History of cervical incompetence °  Oral hypoglycemic controlled White classification A2 gestational diabetes mellitus (GDM) °  AMA (advanced maternal age) multigravida 35+ ° °Additional problems: None    °Discharge diagnosis: Term Pregnancy Delivered, VBAC, GDM A2, and Type 2 DM (dx at 12 weeks)                                       °Post partum procedures: None °Augmentation: AROM and Pitocin °Complications: None ° °Hospital course: Induction of Labor With Vaginal Delivery   °40 y.o. yo G4P3104 at [redacted]w[redacted]d was admitted to the hospital 12/28/2021 for induction of labor.  Indication for induction: A2 DM.  Patient had an uncomplicated labor course as follows: °Membrane Rupture Time/Date: 10:24 AM ,12/28/2021   °Delivery Method:VBAC, Spontaneous  °Episiotomy: None  °Lacerations:  None  °Details of delivery can be found in separate delivery note.  Patient had a routine postpartum course. Patient has chronic hypertension with home medication amlodipine (5 years) was started and held for softer blood pressures 110s-120s systolics. Was more normotensive at discharge and plan to restart amlodipine at home. Patient is discharged home 12/30/21.  ° °Newborn Data: °Birth date:12/28/2021  °Birth time:1:56 PM  °Gender:Female  °Living status:Living  °Apgars:9 ,9  °Weight:2940 g  ° °Magnesium Sulfate received: No °BMZ received: No °Rhophylac:N/A °MMR:N/A °T-DaP: Possible at HD °Flu: Yes  prenatally °Transfusion:Yes IV Iron ° °Physical exam  °Vitals:  ° 12/29/21 0745 12/29/21 1306 12/29/21 2048 12/30/21 0603  °BP: 120/72 (!) 122/49 129/75 107/64  °Pulse: (!) 57 66 60 (!) 53  °Resp: 18 16 18 18  °Temp: 98.4 °F (36.9 °C) 98.5 °F (36.9 °C) 98.3 °F (36.8 °C) 98.1 °F (36.7 °C)  °TempSrc: Oral Oral Oral Oral  °SpO2: 100% 100%    °Weight:      °Height:      ° °General: alert and cooperative °Lochia: appropriate °Uterine Fundus: firm °Incision: N/A °DVT Evaluation: No evidence of DVT seen on physical exam. °Labs: °Lab Results  °Component Value Date  ° WBC 8.3 12/29/2021  ° HGB 8.7 (L) 12/29/2021  ° HCT 25.6 (L) 12/29/2021  ° MCV 87.7 12/29/2021  ° PLT 200 12/29/2021  ° °CMP Latest Ref Rng & Units 12/28/2021  °Glucose 70 - 99 mg/dL 102(H)  °BUN 6 - 20 mg/dL 11  °Creatinine 0.44 - 1.00 mg/dL 0.54  °Sodium 135 - 145 mmol/L 136  °Potassium 3.5 - 5.1 mmol/L 3.4(L)  °Chloride 98 - 111 mmol/L 106  °CO2 22 - 32 mmol/L 19(L)  °Calcium 8.9 - 10.3 mg/dL 8.6(L)  °Total Protein 6.5 - 8.1 g/dL 6.5  °Total Bilirubin 0.3 - 1.2 mg/dL 0.4  °Alkaline Phos 38 - 126 U/L 134(H)  °AST 15 - 41 U/L 19  °ALT 0 - 44 U/L 11  ° °Edinburgh Score: °Edinburgh Postnatal Depression Scale Screening Tool 12/29/2021  °I have been able to laugh and see the funny side   side of things. 0  I have looked forward with enjoyment to things. 0  I have blamed myself unnecessarily when things went wrong. 1  I have been anxious or worried for no good reason. 0  I have felt scared or panicky for no good reason. 0  Things have been getting on top of me. 1  I have been so unhappy that I have had difficulty sleeping. 0  I have felt sad or miserable. 0  I have been so unhappy that I have been crying. 0  The thought of harming myself has occurred to me. 0  Edinburgh Postnatal Depression Scale Total 2     After visit meds:  Allergies as of 12/30/2021       Reactions   Latex    burning   Sulfa Antibiotics Hives   Reports high fever and red rash on arms         Medication List     STOP taking these medications    aspirin 81 MG tablet   metFORMIN 500 MG tablet Commonly known as: Glucophage   progesterone 200 MG capsule Commonly known as: Prometrium       TAKE these medications    acetaminophen 325 MG tablet Commonly known as: Tylenol Take 2 tablets (650 mg total) by mouth every 4 (four) hours as needed for moderate pain.   amLODipine 10 MG tablet Commonly known as: NORVASC Take 1 tablet (10 mg total) by mouth daily.   ferrous sulfate 325 (65 FE) MG tablet Take 1 tablet (325 mg total) by mouth every other day. Start taking on: January 01, 2022   ibuprofen 600 MG tablet Commonly known as: ADVIL Take 1 tablet (600 mg total) by mouth every 6 (six) hours.   prenatal multivitamin Tabs tablet Take 1 tablet by mouth daily.         Discharge home in stable condition Infant Feeding: Breast Infant Disposition:home with mother Discharge instruction: per After Visit Summary and Postpartum booklet. Activity: Advance as tolerated. Pelvic rest for 6 weeks.  Diet: routine diet Future Appointments: Future Appointments  Date Time Provider [place]  01/11/2022  8:30 AM [facility] NURSE [facility]  01/26/2022  8:20 AM [facility] LAB [place] [facility]  01/26/2022  9:55 AM Griffin Basil, MD [facility]   Follow up Visit: Message sent to [facility] by Dr. Cy Blamer on 2/18 Please schedule this patient for a In person postpartum visit in 4 weeks with the following provider: Any provider. Additional Postpartum F/U:2 hour GTT  Low risk pregnancy complicated by: GDM Delivery mode:  VBAC, Spontaneous  Anticipated Birth Control:  Plans Interval BTL   12/30/2021 Gerrit Heck, MD

## 2021-12-28 NOTE — Progress Notes (Addendum)
Bonnie Morales is a 41 y.o. [phone] at [redacted]w[redacted]d by LMP admitted for induction of labor due to [facility] on amlodipine, A2/BGDM on metformin.  She has prior C/S with VBAC x 2.   Subjective: Pt doing well, feels contractions, but tolerable at this time.  She desires natural labor, hx of epidural use in the past.   Objective: Upon entry in room, pt resting in chair and breathing through contractions.  Accompanied by spouse.  BP (!) 141/72    Pulse (!) 55    Temp 97.6 F (36.4 C) (Oral)    Resp 16    Ht 5\' 5"  (1.651 m)    Wt 93.5 kg    LMP 04/10/2021 (Exact Date)    BMI 34.30 kg/m  No intake/output data recorded. No intake/output data recorded.  FHT:  FHR: 140 bpm, variability: moderate,  accelerations:  Present,  decelerations:  Absent UC:   regular, every 2-3 minutes SVE:   4/70/-2/Vertex by Bonnie Morales, SNM, ECU  Labs: Lab Results  Component Value Date   WBC 9.0 12/28/2021   HGB 12.0 12/28/2021   HCT 35.7 (L) 12/28/2021   MCV 88.1 12/28/2021   PLT 253 12/28/2021    Assessment / Plan: Induction of labor due to cHTN and A2GDM,  progressing well on pitocin.  Will reassess in 4 hours and offer AROM after 2nd dose of PCN. cHTN: mild range BP's-highest 140's/70's.  Pt asymptomatic for preE.  Continue Amlodipine.  A2GDM: CBG's reviewed and WNL's. Continue to hold Metformin.  TOLAC: hx of VBAC x 2 Fetal Wellbeing:  Category I Pain Control:  Labor support without medications I/D:   GBS positive, continue PCN as ordered  Anticipated MOD:  NSVD Contraception: desires BTL if C/S [facility] Bonnie Morales 12/28/2021, 9:14 AM

## 2021-12-28 NOTE — Lactation Note (Signed)
This note was copied from a baby's chart. Lactation Consultation Note  Patient Name: Bonnie Morales Date: 12/28/2021 Reason for consult: Initial assessment;Early term 37-38.6wks Age:40 hours   Initial Lactation Consult:  Mother's request was a "No" for a lactation consult in labor and delivery.  RN in room working with family and inquired about mother's current preference.  Mother would like to be a "prn" status and will call for lactation assistance as needed.  Mother reported baby breast fed well for about 20 minutes after birth.  She is now preparing to provide formula supplementation.  Acknowledged mother's request and she is aware that she may call for assistance at any time.   Maternal Data      Feeding Mother's Current Feeding Choice: Breast Milk and Formula  LATCH Score Latch: Grasps breast easily, tongue down, lips flanged, rhythmical sucking.  Audible Swallowing: Spontaneous and intermittent  Type of Nipple: Everted at rest and after stimulation  Comfort (Breast/Nipple): Soft / non-tender  Hold (Positioning): No assistance needed to correctly position infant at breast.  LATCH Score: 10   Lactation Tools Discussed/Used    Interventions    Discharge    Consult Status Consult Status: PRN (Per mother's request)    Tari Lecount R Ellianna Ruest 12/28/2021, 6:07 PM

## 2021-12-29 LAB — CBC
HCT: 25.6 % — ABNORMAL LOW (ref 36.0–46.0)
Hemoglobin: 8.7 g/dL — ABNORMAL LOW (ref 12.0–15.0)
MCH: 29.8 pg (ref 26.0–34.0)
MCHC: 34 g/dL (ref 30.0–36.0)
MCV: 87.7 fL (ref 80.0–100.0)
Platelets: 200 10*3/uL (ref 150–400)
RBC: 2.92 MIL/uL — ABNORMAL LOW (ref 3.87–5.11)
RDW: 13.8 % (ref 11.5–15.5)
WBC: 8.3 10*3/uL (ref 4.0–10.5)
nRBC: 0 % (ref 0.0–0.2)

## 2021-12-29 LAB — CULTURE, BETA STREP (GROUP B ONLY): Strep Gp B Culture: NEGATIVE

## 2021-12-29 MED ORDER — SODIUM CHLORIDE 0.9 % IV SOLN
500.0000 mg | Freq: Once | INTRAVENOUS | Status: AC
  Administered 2021-12-29: 500 mg via INTRAVENOUS
  Filled 2021-12-29: qty 25

## 2021-12-29 MED ORDER — FERROUS SULFATE 325 (65 FE) MG PO TABS
325.0000 mg | ORAL_TABLET | ORAL | Status: DC
  Administered 2021-12-30: 325 mg via ORAL
  Filled 2021-12-29: qty 1

## 2021-12-29 NOTE — Progress Notes (Signed)
Post Partum Day 2 (Nurse Jiles Garter used for interpreting)  Subjective: Bonnie Morales is a 41 y.o. [phone] [redacted]w[redacted]d s/p VBAC. S/P TXA and cytotec for bleeding after delivery.  She has cHTN, well controlled on amlodipine.  Diet controlled type 2 DM (dx at 12 weeks with elevated 2 hr gtt). Tx'd with metformin.  No acute events overnight.  Pt denies problems with ambulating, voiding or po intake.  She denies nausea or vomiting. However, she reports fatigue and weakness, no dizziness.   Pain is well controlled.  She has not had flatus.  Lochia Small, no clots "less than yesterday".  Plan for birth control is no method.  Method of Feeding: breast and bottle feeding.  Objective: Blood pressure (!) 122/49, pulse 66, temperature 98.5 F (36.9 C), temperature source Oral, resp. rate 16, height 5\' 5"  (1.651 m), weight 93.5 kg, last menstrual period 04/10/2021, SpO2 100 %, unknown if currently breastfeeding.  Physical Exam:  General: alert, cooperative and no distress Lochia:normal flow Chest: CTAB Heart: RRR w/o M/G/R Abdomen: +BS, soft, mild TTP (appropriate) Uterine Fundus: firm, at U DVT Evaluation: No evidence of DVT seen on physical exam. Extremities: no edema  Recent Labs    12/28/21 0110 12/29/21 0539  HGB 12.0 8.7*  HCT 35.7* 25.6*    Assessment/Plan: ASSESSMENT: Bonnie Morales is a 41 y.o. [id number] 100w3d s/p VBAC. VBAC: Continue routine PP care.  Breastfeeding support PRN.  cHTN: mild range BP since delivery (130-140/70-80's). Continue amlodipine.  BP check in 1 week in the office.  T2DM: CBG's 78-94.  Discussed 2 hr gtt at 6 week postpartum visit.  Anemia with fatigue: Hgb 12.0-->8.7, IV iron transfusion ordered.  Discussed IV iron transfusion then PO iron with iron rich foods.   Plan for discharge tomorrow and reassess fatigue.     LOS: 1 day   [facility] Krystal Delduca 12/29/2021, 1:35 PM

## 2021-12-29 NOTE — Progress Notes (Signed)
Spoke to [facility] Darrelyn Hillock) about re starting Amlodipine for pt.  Currently not ordered in Epic to give to patient.  Patient states she has been taking this medication for about four years.  Last dose given at 0951 on 12/28/2021.  BP's have been WNL on MBU.  Last BP=122/49.

## 2021-12-29 NOTE — Anesthesia Postprocedure Evaluation (Signed)
Anesthesia Post Note  Patient: Bonnie Morales  Procedure(s) Performed: AN AD [place]     Patient location during evaluation: Mother Baby Anesthesia Type: Epidural Level of consciousness: awake and alert Pain management: pain level controlled Vital Signs Assessment: post-procedure vital signs reviewed and stable Respiratory status: spontaneous breathing, nonlabored ventilation and respiratory function stable Cardiovascular status: stable Postop Assessment: no headache, no backache, epidural receding, patient able to bend at knees, no apparent nausea or vomiting, able to ambulate and adequate PO intake Anesthetic complications: no   No notable events documented.  Last Vitals:  Vitals:   12/29/21 0559 12/29/21 0745  BP: 111/63 120/72  Pulse: 73 (!) 57  Resp: 16 18  Temp: 36.6 C 36.9 C  SpO2:  100%    Last Pain:  Vitals:   12/29/21 0745  TempSrc: Oral  PainSc:    Pain Goal:                   Jabier Mutton

## 2021-12-30 MED ORDER — IBUPROFEN 600 MG PO TABS: 600.0000 mg | ORAL_TABLET | Freq: Four times a day (QID) | ORAL | 0 refills | Status: DC

## 2021-12-30 MED ORDER — FERROUS SULFATE 325 (65 FE) MG PO TABS: 325.0000 mg | ORAL_TABLET | ORAL | 0 refills | Status: DC

## 2021-12-30 NOTE — Progress Notes (Deleted)
POSTPARTUM PROGRESS NOTE  Post Partum Day 2  Subjective:  [facility] electronic interpreter used during visit.  Bonnie Morales is a 41 y.o. [phone] s/p VBAC at [redacted]w[redacted]d.  She reports she is doing well. No acute events overnight. She denies any problems with ambulating, voiding or po intake. Denies nausea or vomiting.  Pain is well controlled.  Lochia is mild with no clots.  Objective: Blood pressure 107/64, pulse (!) 53, temperature 98.1 F (36.7 C), temperature source Oral, resp. rate 18, height 5\' 5"  (1.651 m), weight 93.5 kg, last menstrual period 04/10/2021, SpO2 100 %, unknown if currently breastfeeding.  Physical Exam:  General: alert, cooperative and no distress Chest: no respiratory distress Heart:regular rate, distal pulses intact Abdomen: soft, nontender,  Uterine Fundus: firm, appropriately tender DVT Evaluation: No calf swelling or tenderness Extremities: trace edema bilaterally, no calf swelling Skin: warm, dry  Recent Labs    12/28/21 0110 12/29/21 0539  HGB 12.0 8.7*  HCT 35.7* 25.6*    Assessment/Plan: Bonnie Morales is a 41 y.o. [id number] s/p VBAC at [redacted]w[redacted]d   PPD#2 - Doing well  Routine postpartum care cHTN-BP ranges have been 107-129/49-75 (holding amlodipine, may restart at d/c). Denies headache A2GDM-CBGs fasting 2/16 82 Anemia-received 1 x IV iron yesterday, denies lightheadedness Contraception: No plan currently Feeding: Breast feeding going well, says her milk supply is coming in Dispo: Plan for discharge today.   LOS: 2 days   Gerrit Heck, MD PGY-1  Family Medicine Resident 12/30/2021, 7:04 AM

## 2021-12-30 NOTE — Discharge Summary (Deleted)
Postpartum Discharge Summary     Patient Name: Bonnie Morales DOB: 10/21/1981 MRN: [id number]  Date of admission: 12/28/2021 Delivery date:12/28/2021  Delivering provider: Olga Coaster  Date of discharge: 12/30/2021  Admitting diagnosis: Chronic hypertension affecting pregnancy [O10.919] Intrauterine pregnancy: [redacted]w[redacted]d     Secondary diagnosis:  Principal Problem:   Chronic hypertension affecting pregnancy Active Problems:   Vaginal birth after cesarean delivery   History of VBAC   History of eclampsia   Language barrier   History of cervical incompetence   Oral hypoglycemic controlled White classification A2 gestational diabetes mellitus (GDM)   AMA (advanced maternal age) multigravida 35+  Additional problems: None    Discharge diagnosis: Term Pregnancy Delivered, CHTN, and GDM A2                                              Post partum procedures: None Augmentation: AROM and Pitocin Complications: None  Hospital course: Onset of Labor With Vaginal Delivery      41 y.o. yo [id number] at [redacted]w[redacted]d was admitted in Latent Labor on 12/28/2021. Patient had an uncomplicated labor course as follows:  Membrane Rupture Time/Date: 10:24 AM ,12/28/2021   Delivery Method:VBAC, Spontaneous  Episiotomy: None  Lacerations:  None  Patient had an uncomplicated postpartum course. BP remained stable, no signs/symptoms of preeclampsia.  She is ambulating, tolerating a regular diet, passing flatus, and urinating well. Patient is discharged home in stable condition on 12/30/21.  Newborn Data: Birth date:12/28/2021  Birth time:1:56 PM  Gender:Female  Living status:Living  Apgars:9 ,9  Weight:2940 g   Physical exam  Vitals:   12/29/21 0745 12/29/21 1306 12/29/21 2048 12/30/21 0603  BP: 120/72 (!) 122/49 129/75 107/64  Pulse: (!) 57 66 60 (!) 53  Resp: 18 16 18 18   Temp: 98.4 F (36.9 C) 98.5 F (36.9 C) 98.3 F (36.8 C) 98.1 F (36.7 C)  TempSrc: Oral Oral Oral Oral  SpO2: 100% 100%     Weight:      Height:       General: alert, cooperative, and no distress Lochia: appropriate Uterine Fundus: firm DVT Evaluation: No evidence of DVT seen on physical exam. Negative Homan's sign. No cords or calf tenderness. No significant calf/ankle edema. Labs: Lab Results  Component Value Date   WBC 8.3 12/29/2021   HGB 8.7 (L) 12/29/2021   HCT 25.6 (L) 12/29/2021   MCV 87.7 12/29/2021   PLT 200 12/29/2021   CMP Latest Ref Rng & Units 12/28/2021  Glucose 70 - 99 mg/dL 102(H)  BUN 6 - 20 mg/dL 11  Creatinine 0.44 - 1.00 mg/dL 0.54  Sodium 135 - 145 mmol/L 136  Potassium 3.5 - 5.1 mmol/L 3.4(L)  Chloride 98 - 111 mmol/L 106  CO2 22 - 32 mmol/L 19(L)  Calcium 8.9 - 10.3 mg/dL 8.6(L)  Total Protein 6.5 - 8.1 g/dL 6.5  Total Bilirubin 0.3 - 1.2 mg/dL 0.4  Alkaline Phos 38 - 126 U/L 134(H)  AST 15 - 41 U/L 19  ALT 0 - 44 U/L 11   Edinburgh Score: Edinburgh Postnatal Depression Scale Screening Tool 12/29/2021  I have been able to laugh and see the funny side of things. 0  I have looked forward with enjoyment to things. 0  I have blamed myself unnecessarily when things went wrong. 1  I have been anxious or worried for no  good reason. 0  I have felt scared or panicky for no good reason. 0  Things have been getting on top of me. 1  I have been so unhappy that I have had difficulty sleeping. 0  I have felt sad or miserable. 0  I have been so unhappy that I have been crying. 0  The thought of harming myself has occurred to me. 0  Edinburgh Postnatal Depression Scale Total 2      After visit meds:  Allergies as of 12/30/2021       Reactions   Latex    burning   Sulfa Antibiotics Hives   Reports high fever and red rash on arms        Medication List     STOP taking these medications    aspirin 81 MG tablet   metFORMIN 500 MG tablet Commonly known as: Glucophage   progesterone 200 MG capsule Commonly known as: Prometrium       TAKE these medications     acetaminophen 325 MG tablet Commonly known as: Tylenol Take 2 tablets (650 mg total) by mouth every 4 (four) hours as needed for moderate pain.   amLODipine 10 MG tablet Commonly known as: NORVASC Take 1 tablet (10 mg total) by mouth daily.   ferrous sulfate 325 (65 FE) MG tablet Take 1 tablet (325 mg total) by mouth every other day. Start taking on: January 01, 2022   ibuprofen 600 MG tablet Commonly known as: ADVIL Take 1 tablet (600 mg total) by mouth every 6 (six) hours.   prenatal multivitamin Tabs tablet Take 1 tablet by mouth daily.         Discharge home in stable condition Infant Feeding: Bottle and Breast Infant Disposition:home with mother Discharge instruction: per After Visit Summary and Postpartum booklet. Activity: Advance as tolerated. Pelvic rest for 6 weeks.  Diet: routine diet Anticipated Birth Control: Unsure  Future Appointments: Future Appointments  Date Time Provider [place]  01/11/2022  8:30 AM [facility] NURSE [facility]  01/26/2022  8:20 AM [facility]  01/26/2022  9:55 AM Griffin Basil, MD [facility]        12/30/2021 Verita Schneiders, MD

## 2022-01-01 ENCOUNTER — Telehealth: Payer: Self-pay | Admitting: Family Medicine

## 2022-01-01 NOTE — Telephone Encounter (Signed)
Called pt with Spanish Interpreter Raquel M., and asked pt if she could explain the bleeding that she is having.  Pt reports that she had a large clot when she was having a bile movement.  Pt reports that she has not had a lot of bleeding since.  Pt informed that it would be normal to pass a clot after delivery however she should not have continuous clotting and saturating a pad an hour.  Pt advised if she does to please give the office a call.    Frances Nickels  01/01/22

## 2022-01-01 NOTE — Telephone Encounter (Signed)
Spanish.Marland KitchenMarland KitchenMarland KitchenPatient had her baby on Thursday 12/28/21 but she is experiencing severe bleeding. She has a nurse visit for 01/11/22 but would like to speak with a nurse before then.

## 2022-01-04 ENCOUNTER — Inpatient Hospital Stay (HOSPITAL_COMMUNITY)
Admission: AD | Admit: 2022-01-04 | Discharge: 2022-01-04 | Disposition: A | Discharge status: Home / Self Care | Payer: Self-pay | Attending: Obstetrics & Gynecology | Admitting: Obstetrics & Gynecology

## 2022-01-04 ENCOUNTER — Inpatient Hospital Stay (HOSPITAL_COMMUNITY): Payer: Self-pay

## 2022-01-04 DIAGNOSIS — D259 Leiomyoma of uterus, unspecified: Secondary | ICD-10-CM | POA: Insufficient documentation

## 2022-01-04 LAB — CBC
HCT: 28.8 % — ABNORMAL LOW (ref 36.0–46.0)
Hemoglobin: 9.8 g/dL — ABNORMAL LOW (ref 12.0–15.0)
MCH: 30.6 pg (ref 26.0–34.0)
MCHC: 34 g/dL (ref 30.0–36.0)
MCV: 90 fL (ref 80.0–100.0)
Platelets: 406 10*3/uL — ABNORMAL HIGH (ref 150–400)
RBC: 3.2 MIL/uL — ABNORMAL LOW (ref 3.87–5.11)
RDW: 14.6 % (ref 11.5–15.5)
WBC: 10 10*3/uL (ref 4.0–10.5)
nRBC: 0 % (ref 0.0–0.2)

## 2022-01-04 MED ORDER — MISOPROSTOL 200 MCG PO TABS
1000.0000 ug | ORAL_TABLET | Freq: Once | ORAL | Status: AC
  Administered 2022-01-04: 1000 ug via ORAL
  Filled 2022-01-04: qty 5

## 2022-01-04 NOTE — MAU Provider Note (Signed)
History     [id number]  Arrival date and time: 01/04/22 1413    Chief Complaint  Patient presents with   Vaginal Bleeding     HPI Bonnie Morales is a 41 y.o. at 1 week post partum who presents for vaginal bleeding. She had a vaginal delivery on 2/16. Had some issues with increased bleeding during postpartum course that was treated with cytotec & TXA. States bleeding increased yesterday. She mostly fills a pad with dark red blood every 2 hours. Also passes small blood clots every time she goes to the bathroom. Reports some lower abdominal cramping yesterday. No fever. No intercourse since delivery.   OB History     Gravida  4   Para  4   Term  3   Preterm  1   AB  0   Living  4      SAB  0   IAB  0   Ectopic  0   Multiple  0   Live Births  4           Past Medical History:  Diagnosis Date   Chronic hypertension    Gestational diabetes    History of kidney stones    passed stone   Hypertension    2014   Pre-eclampsia     Past Surgical History:  Procedure Laterality Date   CERVICAL CERCLAGE N/A 04/12/2017   Procedure: CERCLAGE CERVICAL;  Surgeon: Chancy Milroy, MD;  Location: [facility];  Service: Gynecology;  Laterality: N/A;   CERVICAL CERCLAGE N/A 07/25/2021   Procedure: CERCLAGE CERVICAL;  Surgeon: Chancy Milroy, MD;  Location: [facility];  Service: Gynecology;  Laterality: N/A;   CESAREAN SECTION     epidural - in [place]    Family History  Problem Relation Age of Onset   Diabetes Mother    Hypertension Mother    Heart disease Mother        tachycardia   Hypertension Father    Mental retardation Sister    Cancer Paternal Grandmother     Allergies  Allergen Reactions   Latex     burning   Sulfa Antibiotics Hives    Reports high fever and red rash on arms    No current facility-administered medications on file prior to encounter.   Current Outpatient Medications on File Prior to Encounter  Medication Sig Dispense Refill    acetaminophen (TYLENOL) 325 MG tablet Take 2 tablets (650 mg total) by mouth every 4 (four) hours as needed for moderate pain. 30 tablet 0   amLODipine (NORVASC) 10 MG tablet Take 1 tablet (10 mg total) by mouth daily. 90 tablet 3   ferrous sulfate 325 (65 FE) MG tablet Take 1 tablet (325 mg total) by mouth every other day. 30 tablet 0   ibuprofen (ADVIL) 600 MG tablet Take 1 tablet (600 mg total) by mouth every 6 (six) hours. 30 tablet 0   Prenatal Vit-Fe Fumarate-FA (PRENATAL MULTIVITAMIN) TABS tablet Take 1 tablet by mouth daily.       ROS Pertinent positives and negative per HPI, all others reviewed and negative  Physical Exam   BP 124/68 (BP Location: Right Arm)    Pulse 65    Temp 98.4 F (36.9 C) (Oral)    Resp 15    SpO2 100%   Patient Vitals for the past 24 hrs:  BP Temp Temp src Pulse Resp SpO2  01/04/22 1544 124/68 98.4 F (36.9 C) Oral 65 15 100 %  Physical Exam Vitals and nursing note reviewed.  Constitutional:      General: She is not in acute distress.    Appearance: Normal appearance.  HENT:     Head: Normocephalic and atraumatic.  Eyes:     General: No scleral icterus.    Conjunctiva/sclera: Conjunctivae normal.  Pulmonary:     Effort: Pulmonary effort is normal. No respiratory distress.  Abdominal:     Palpations: Abdomen is soft.     Tenderness: There is no abdominal tenderness.  Skin:    General: Skin is warm and dry.  Neurological:     Mental Status: She is alert.  Psychiatric:        Mood and Affect: Mood normal.        Behavior: Behavior normal.       Labs Results for orders placed or performed during the hospital encounter of 01/04/22 (from the past 24 hour(s))  CBC     Status: Abnormal   Collection Time: 01/04/22  4:56 PM  Result Value Ref Range   WBC 10.0 4.0 - 10.5 K/uL   RBC 3.20 (L) 3.87 - 5.11 MIL/uL   Hemoglobin 9.8 (L) 12.0 - 15.0 g/dL   HCT 28.8 (L) 36.0 - 46.0 %   MCV 90.0 80.0 - 100.0 fL   MCH 30.6 26.0 - 34.0 pg   MCHC  34.0 30.0 - 36.0 g/dL   RDW 14.6 11.5 - 15.5 %   Platelets 406 (H) 150 - 400 K/uL   nRBC 0.0 0.0 - 0.2 %    Imaging US PELVIC COMPLETE WITH TRANSVAGINAL  Result Date: 01/04/2022 CLINICAL DATA:  Postpartum bleeding EXAM: TRANSABDOMINAL AND TRANSVAGINAL ULTRASOUND OF PELVIS TECHNIQUE: Both transabdominal and transvaginal ultrasound examinations of the pelvis were performed. Transabdominal technique was performed for global imaging of the pelvis including uterus, ovaries, adnexal regions, and pelvic cul-de-sac. It was necessary to proceed with endovaginal exam following the transabdominal exam to visualize the uterus endometrium ovaries. COMPARISON:  None FINDINGS: Uterus Measurements: 10.1 x 7.4 x 11.6 cm = volume: 484.8 mL. Large complex collection in the cervix measuring up to 6.1 cm. Fibroid within the right uterine corpus measuring 4.8 x 6.2 x 4.8 cm. Endometrium Thickness: Up 20 mm. Heterogenous in appearance. Multiple echogenic foci within the endometrial canal with dirty shadowing suspect air within the endometrial cavity. Right ovary Measurements: 4.4 x 2.7 x 2.6 cm = volume: 16.2 mL. Normal appearance/no adnexal mass. Left ovary Not seen. Other findings No abnormal free fluid. IMPRESSION: 1. Endometrial thickness up to 2 mm without significant vascularity, findings are indeterminate for blood products versus hypovascular retained products of conception. Endometrial echotexture is heterogeneous with multiple echogenic foci suspected to represent air within the endometrial canal. This could be related to recent postpartum status but correlate for symptoms of infection. 2. Large complex avascular collection at the cervix, question a hematoma 3. Nonvisualized left ovary. Electronically Signed   By: Donavan Foil M.D.   On: 01/04/2022 18:25    MAU Course  Procedures Lab Orders         CBC    Meds ordered this encounter  Medications   misoprostol (CYTOTEC) tablet 1,000 mcg   Imaging Orders          US PELVIC COMPLETE WITH TRANSVAGINAL     MDM Presents for post partum bleeding.  Labs & ultrasound ordered & reviewed.  Discussed results with Dr. Dione Plover who recommends dose of cytotec to be given here. Patient can be discharged home to keep  her next appointment in the office.   *[facility] provided Spanish interpreter used for this encounter* Assessment and Plan   1. Postpartum bleeding   -given cytotec in MAU prior to discharge. Reviewed bleeding/infection precautions & reasons to return to MAU -keep f/u appointment with office   Jorje Guild, NP 01/04/22 7:12 PM

## 2022-01-04 NOTE — MAU Note (Signed)
...  Bonnie Morales is a 41 y.o. at 1 week post partum here in MAU reporting: Vaginal bleeding since delivery but states over the past two days it has increased. She states she has passed blood clots that are the size of a quarter each time she uses the restroom. She states she also feels like she has "cuts" on her vagina because every time she sits down she feels pain at the opening of her vagina. Endorsing lower abdominal cramping that had ceased but has now returned and ibuprofen is not helping.   She states her BP yesterday was 70/40 and she felt as if she was going to pass out. She states she was standing in the kitchen when this occurred. She states she has been drinking 3-4 water bottles each day and 2-3 hot drinks.   Last doses: Ibuprofen around 0500 Iron yesterday Prenatals   Pain score:  3/10 vagina 3/10 lower abdomen  Vitals:   01/04/22 1544  BP: 124/68  Pulse: 65  Resp: 15  Temp: 98.4 F (36.9 C)  SpO2: 100%

## 2022-01-09 ENCOUNTER — Telehealth (HOSPITAL_COMMUNITY): Payer: Self-pay | Admitting: *Deleted

## 2022-01-09 NOTE — Telephone Encounter (Signed)
Attempted Hospital Discharge Follow-Up Call.  No answer.

## 2022-01-11 ENCOUNTER — Ambulatory Visit (INDEPENDENT_AMBULATORY_CARE_PROVIDER_SITE_OTHER): Payer: Self-pay

## 2022-01-11 ENCOUNTER — Other Ambulatory Visit: Payer: Self-pay

## 2022-01-11 VITALS — BP 126/70 | HR 52 | Wt 193.4 lb

## 2022-01-11 DIAGNOSIS — O34219 Maternal care for unspecified type scar from previous cesarean delivery: Secondary | ICD-10-CM

## 2022-01-11 DIAGNOSIS — O10919 Unspecified pre-existing hypertension complicating pregnancy, unspecified trimester: Secondary | ICD-10-CM

## 2022-01-11 NOTE — Progress Notes (Signed)
Pt is here today for PP BP check. Pt has hx of CHTN and was treated with Amlodipine  Rx while pregnant.  Pt had VBAC delivery on 12/28/21. Pt states has not been taking BP Rx due to readmission for increased bleeding and BP low.  Pt states having headache today and rates at 3 on pain scale and took 1 tablet of Rx Norvasc  today at 730am.  Pt denies all s/s of HTN. Pt is currently breastfeeding and doing well otherwise.  ? ?BP: 126/70  ? ?Reviewed BP with Sam CNM. Pt advised to take Rx Norvasc every day for 1 week and then have BP recheck. Pt verbalized understanding and agreeable to plan of care. BP check scheduled for 3/9 at 850am. ? ?Maralyn Witherell,RN  ? ?

## 2022-01-18 ENCOUNTER — Ambulatory Visit: Payer: Self-pay

## 2022-01-18 ENCOUNTER — Telehealth: Payer: Self-pay

## 2022-01-18 NOTE — Telephone Encounter (Signed)
Called pt with interpreter Eda. Pt states she was running late to appt this morning and was not able to call to cancel. Pt would like to reschedule. Offered appt tomorrow. Pt has pediatrician appt at 10:30 AM. Agreeable to 0940 appt with our office. ?

## 2022-01-19 ENCOUNTER — Other Ambulatory Visit: Payer: Self-pay

## 2022-01-19 ENCOUNTER — Ambulatory Visit (INDEPENDENT_AMBULATORY_CARE_PROVIDER_SITE_OTHER): Payer: Self-pay | Admitting: *Deleted

## 2022-01-19 VITALS — BP 121/69 | HR 51 | Ht 65.0 in | Wt 192.3 lb

## 2022-01-19 DIAGNOSIS — Z0130 Encounter for examination of blood pressure without abnormal findings: Secondary | ICD-10-CM

## 2022-01-19 NOTE — Progress Notes (Signed)
Patient here for bp check. Had IOL 37 week for hx CHTN, GDM, AMA hx ecclampisa. D/c home on norvasc. C/o mild headaches =2,3 occasionally mostly relieved with rest or tylenol. Denies edema, visual issues. BP today 121 /69. Advised to continue taking bp norvasc as ordered . Reviewed pp and 2 hour gtt appointments with her for 01/26/22. Advised after her pp visit she will need to follow up with her PCP for [facility]. She voices understanding. ?Staci Acosta ?

## 2022-01-22 NOTE — Progress Notes (Signed)
Patient was assessed and managed by nursing staff during this encounter. I have reviewed the chart and agree with the documentation and plan. I have also made any necessary editorial changes. ? ?Griffin Basil, MD ?01/22/2022 1:26 PM   ?

## 2022-01-26 ENCOUNTER — Ambulatory Visit (INDEPENDENT_AMBULATORY_CARE_PROVIDER_SITE_OTHER): Payer: Self-pay | Admitting: Obstetrics and Gynecology

## 2022-01-26 ENCOUNTER — Other Ambulatory Visit: Payer: Self-pay | Admitting: General Practice

## 2022-01-26 ENCOUNTER — Encounter: Payer: Self-pay | Admitting: Obstetrics and Gynecology

## 2022-01-26 ENCOUNTER — Other Ambulatory Visit: Payer: Self-pay

## 2022-01-26 DIAGNOSIS — Z8632 Personal history of gestational diabetes: Secondary | ICD-10-CM

## 2022-01-26 MED ORDER — NORETHINDRONE 0.35 MG PO TABS: 1.0000 | ORAL_TABLET | Freq: Every day | ORAL | 3 refills | Status: DC

## 2022-01-26 NOTE — Progress Notes (Signed)
? ? ?Post Partum Visit Note ? ?Bonnie Morales is a 41 y.o. [phone] female who presents for a postpartum visit. She is 4 weeks 1 day postpartum following a normal spontaneous vaginal delivery.  I have fully reviewed the prenatal and intrapartum course. The delivery was at [place].  Anesthesia: epidural. Postpartum course has been uncomplicated other than some heavier bleeding and cramping. Baby is doing well. Baby is feeding by breast. Bleeding  was heavy x 2 weeks, but is much lighter now . Bowel function is abnormal; patient describes daily bowel movement that is hard to pass. Bladder function is normal. Patient is not sexually active. Contraception method is IUD. Postpartum depression screening: negative. ? ? ?The pregnancy intention screening data noted above was reviewed. Potential methods of contraception were discussed. The patient elected to proceed with No data recorded. ? ? Edinburgh Postnatal Depression Scale - 01/26/22 1106   ? ?  ? Edinburgh Postnatal Depression Scale:  In the Past 7 Days  ? I have been able to laugh and see the funny side of things. 0   ? I have looked forward with enjoyment to things. 0   ? I have blamed myself unnecessarily when things went wrong. 0   ? I have been anxious or worried for no good reason. 0   ? I have felt scared or panicky for no good reason. 0   ? Things have been getting on top of me. 0   ? I have been so unhappy that I have had difficulty sleeping. 0   ? I have felt sad or miserable. 0   ? I have been so unhappy that I have been crying. 0   ? The thought of harming myself has occurred to me. 0   ? Edinburgh Postnatal Depression Scale Total 0   ? ?  ?  ? ?  ? ? ?Health Maintenance Due  ?Topic Date Due  ? URINE MICROALBUMIN  Never done  ? COVID-19 Vaccine (3 - Booster for Pfizer series) 04/29/2020  ? ? ?The following portions of the patient's history were reviewed and updated as appropriate: allergies, current medications, past family history, past medical  history, past social history, past surgical history, and problem list. ? ?Review of Systems ?Pertinent items are noted in HPI. ? ?Objective:  ?BP 132/66   Pulse 64   Wt 193 lb 8 oz (87.8 kg)   LMP  (LMP Unknown)   Breastfeeding Yes   BMI 32.20 kg/m?   ? ?General:  alert, cooperative, and no distress  ? Breasts:  not indicated  ?Lungs: clear to auscultation bilaterally  ?Heart:  regular rate and rhythm  ?Abdomen: soft, non-tender; bowel sounds normal; no masses,  no organomegaly   ?Wound N/a  ?GU exam:  not indicated  ?     ?Assessment:  ? ? Encounter for routine postpartum care ? ?normal postpartum exam.  ? ?Plan:  ? ?Essential components of care per ACOG recommendations: ? ?1.  Mood and well being: Patient with negative depression screening today. Reviewed local resources for support.  ?- Patient tobacco use? No.   ?- hx of drug use? No.   ? ?2. Infant care and feeding:  ?-Patient currently breastmilk feeding? Yes. Reviewed importance of draining breast regularly to support lactation.  ?-Social determinants of health (SDOH) reviewed in EPIC. No concerns ? ?3. Sexuality, contraception and birth spacing ?- Patient does not want a pregnancy in the next year.  Desired family size is 4 children.  ?-  Reviewed reproductive life planning. Reviewed contraceptive methods based on pt preferences and effectiveness.  Patient desired IUD or IUS today.   ?- Discussed birth spacing of 18 months ? ?4. Sleep and fatigue ?-Encouraged family/partner/community support of 4 hrs of uninterrupted sleep to help with mood and fatigue ? ?5. Physical Recovery  ?- Discussed patients delivery and complications. She describes her labor as good. ?- Patient had a Vaginal, no problems at delivery. Patient had a no lacerations. Perineal healing reviewed. Patient expressed understanding ?- Patient has urinary incontinence? No. ?- Patient is safe to resume physical and sexual activity ? ?6.  Health Maintenance ?- HM due items addressed Yes ?-  Last pap smear 08/21/2019 at [facility] Pap smear not done at today's visit.  ?-Breast Cancer screening indicated? No.  ? ?7. Chronic Disease/Pregnancy Condition follow up: Hypertension and Gestational Diabetes ? ?- PCP follow up ?Pt will pursue  pap and IUD at the health department and [facility] ?Griffin Basil, MD ?Center for [employer], [place] ? ?

## 2022-01-27 LAB — GLUCOSE TOLERANCE, 2 HOURS
Glucose, 2 hour: 64 mg/dL — ABNORMAL LOW (ref 70–139)
Glucose, GTT - Fasting: 89 mg/dL (ref 70–99)

## 2022-01-29 ENCOUNTER — Telehealth: Payer: Self-pay

## 2022-01-29 NOTE — Telephone Encounter (Signed)
[facility] unable to schedule in November. Pt called with interpreter Raquel and given [facility] phone number. ?

## 2022-09-08 IMAGING — US US PELVIS COMPLETE WITH TRANSVAGINAL
1 series · 15 of 25 positions shown · non-contrast
Comparison: None

CLINICAL DATA: Postpartum bleeding

EXAM:
TRANSABDOMINAL AND TRANSVAGINAL ULTRASOUND OF PELVIS
TECHNIQUE: Both transabdominal and transvaginal ultrasound examinations of the
pelvis were performed. Transabdominal technique was performed for
global imaging of the pelvis including uterus, ovaries, adnexal
regions, and pelvic cul-de-sac. It was necessary to proceed with
endovaginal exam following the transabdominal exam to visualize the
uterus endometrium ovaries.

[Series 1: us pelvis complete with transvaginal · 93 acquisitions, 15 frames shown]
[im 1/93]
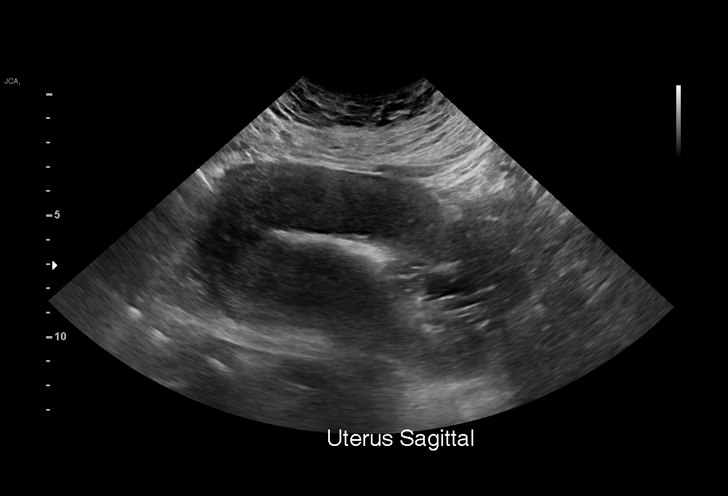
[im 8/93]
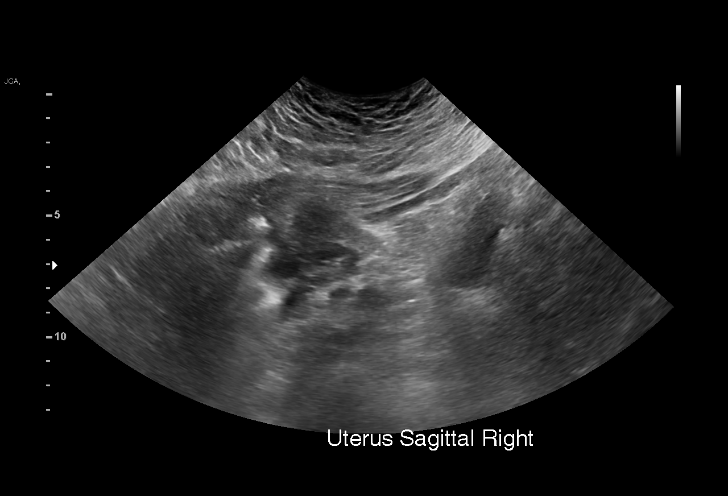
[im 16/93]
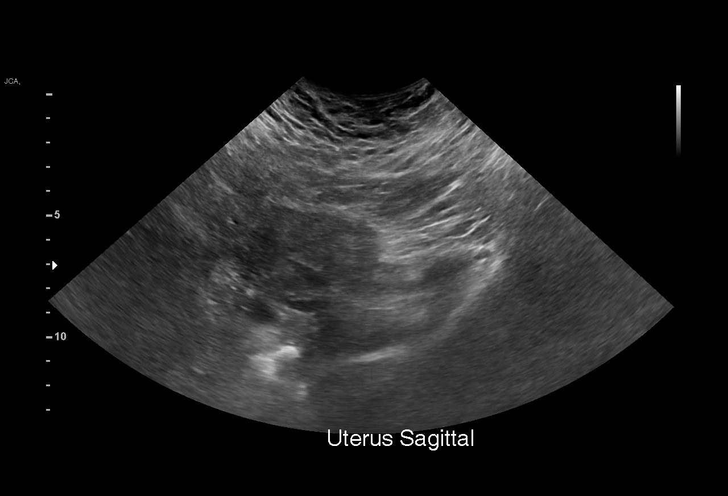
[im 20/93]
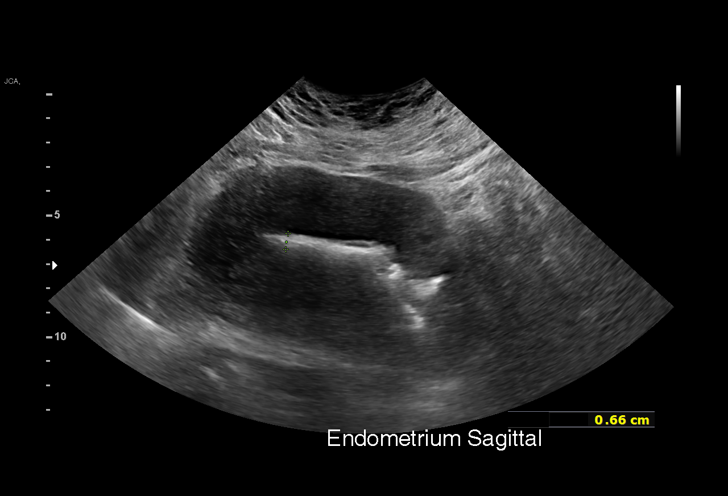
[im 27/93]
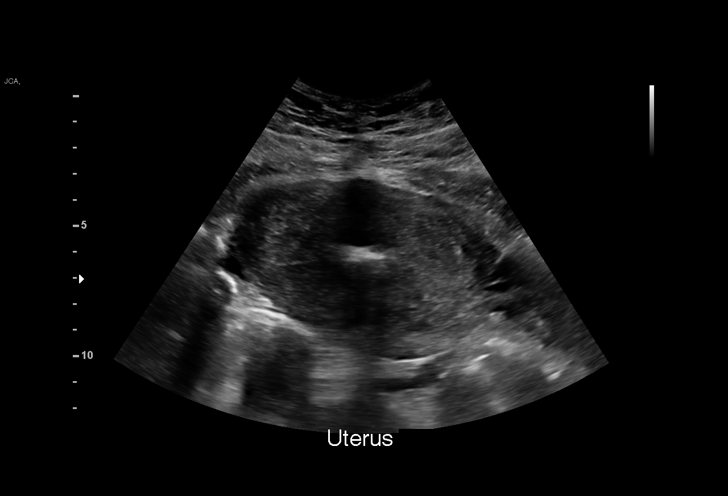
[im 35/93]
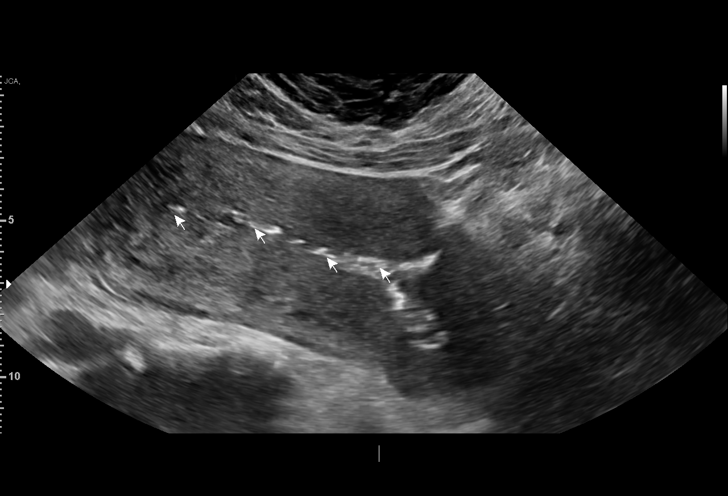
[im 39/93]
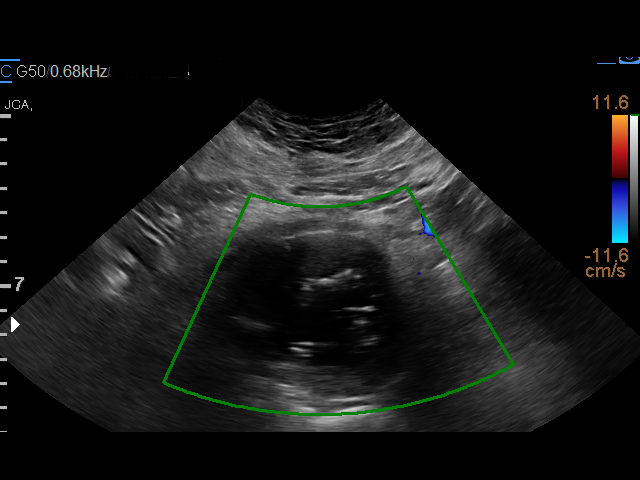
[im 47/93]
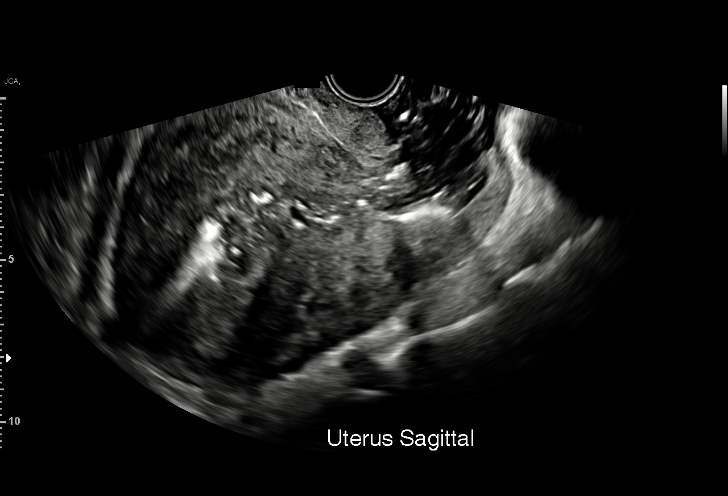
[im 54/93]
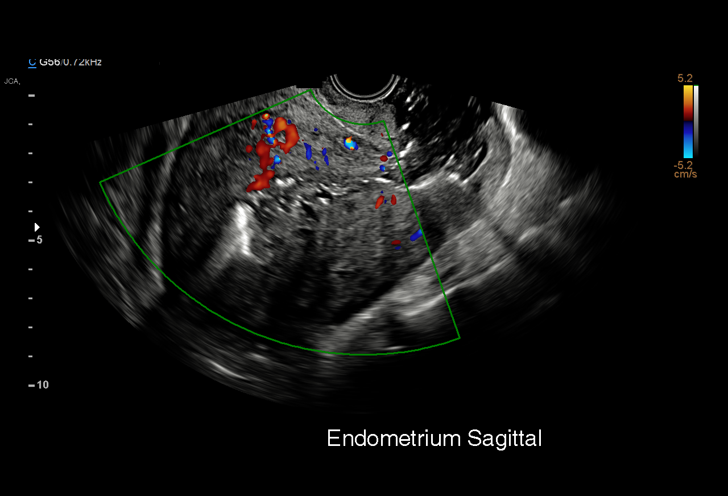
[im 58/93]
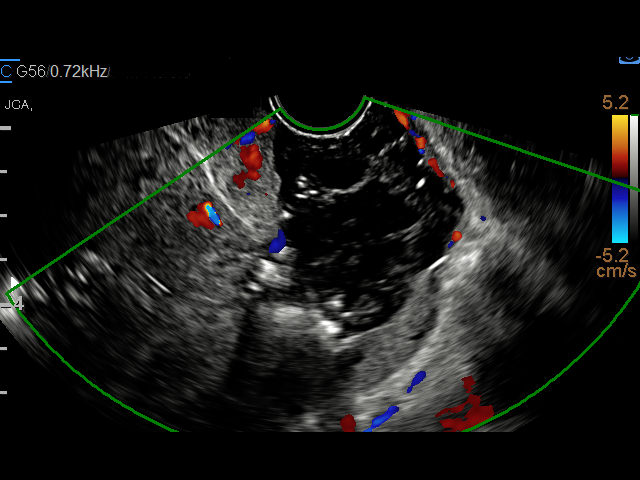
[im 66/93]
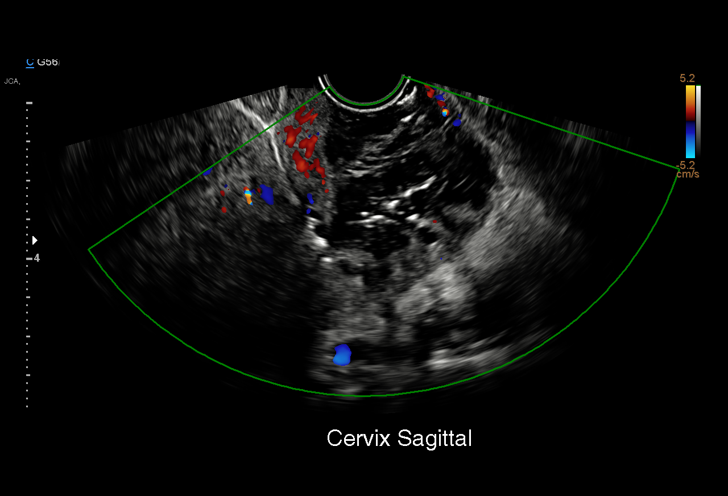
[im 73/93]
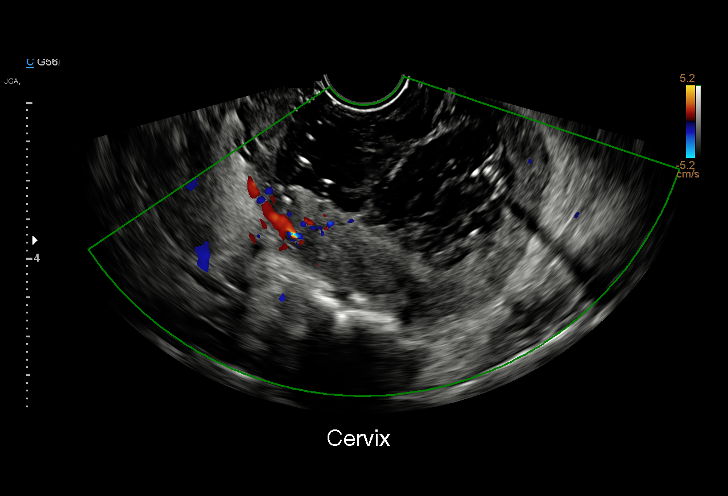
[im 77/93]
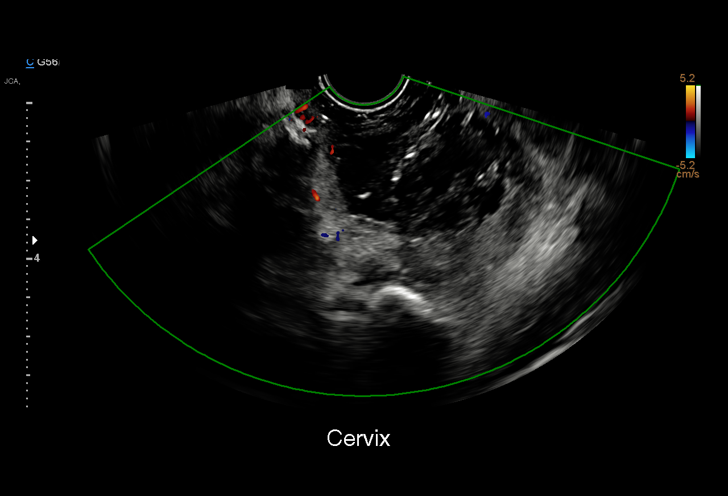
[im 85/93]
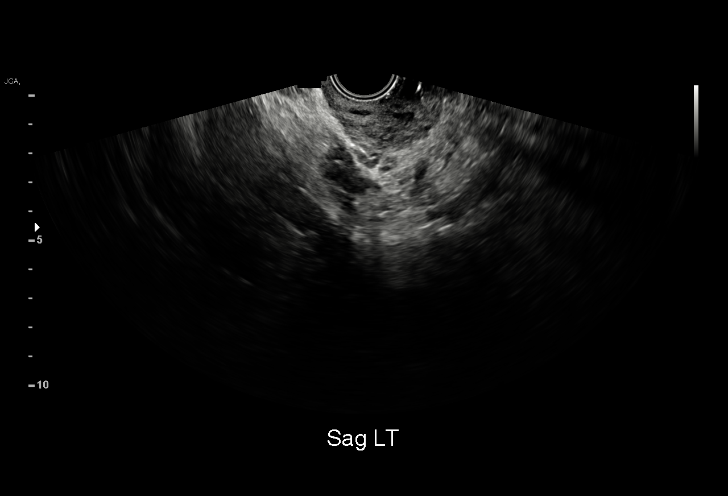
[im 93/93]
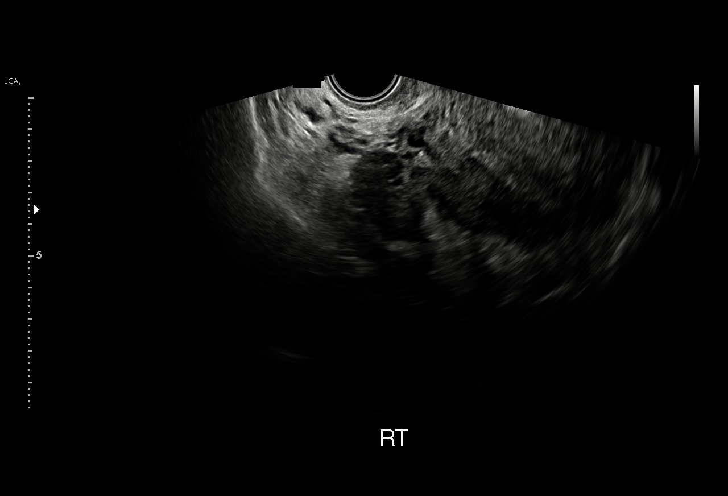

[15 of 25 positions shown; findings below may reference images not displayed]

FINDINGS: Uterus

Measurements: 10.1 x 7.4 x 11.6 cm = volume: 484.8 mL. Large complex
collection in the cervix measuring up to 6.1 cm. Fibroid within the
right uterine corpus measuring 4.8 x 6.2 x 4.8 cm.

Endometrium

Thickness: Up 20 mm. Heterogenous in appearance. Multiple echogenic
foci within the endometrial canal with dirty shadowing suspect air
within the endometrial cavity.

Right ovary

Measurements: 4.4 x 2.7 x 2.6 cm = volume: 16.2 mL. Normal
appearance/no adnexal mass.

Left ovary

Not seen.

Other findings

No abnormal free fluid.
IMPRESSION: 1. Endometrial thickness up to 2 mm without significant vascularity,
findings are indeterminate for blood products versus hypovascular
retained products of conception. Endometrial echotexture is
heterogeneous with multiple echogenic foci suspected to represent
air within the endometrial canal. This could be related to recent
postpartum status but correlate for symptoms of infection.
2. Large complex avascular collection at the cervix, question a
hematoma
3. Nonvisualized left ovary.

## 2022-11-12 NOTE — L&D Delivery Note (Signed)
OB/GYN Faculty Practice Delivery Note  Bonnie Morales is a 42 y.o. [id number] s/p SVD at [redacted]w[redacted]d. She was admitted for SIPE on cHTN with severe features.   ROM: 6h 60m with clear fluid GBS Status: Negative/-- (11/06 0316) Maximum Maternal Temperature: 98.66F   Labor Progress: Initial SVE: 3/50/-3. She then progressed to complete.   Delivery Date/Time: 09/25/23 2022 Delivery: Called to room and patient was complete and pushing. Head delivered LOA with compound left hand presentation. Delivered through a loose nuchal x 1. Shoulder and body delivered in usual fashion. Infant with spontaneous cry, placed on mother's abdomen, dried and stimulated. Cord clamped x 2 after 1-minute delay, and cut by father of baby. Cord blood drawn. Placenta delivered spontaneously with gentle cord traction. Labia, perineum, vagina, and cervix inspected inspected with no lacerations noted. Pitocin and TXA started after delivery of placenta. On fundal exam, pt noted to have boggy uterus, which firmed up after straight catheterization with 100cc urine drained and lower uterine sweep with evacuation of several moderate sized clots. Patient given of rectal Cytotec.   Baby Weight: pending  Placenta: Sent to L&D Complications: None Lacerations: None EBL: 256 mL Analgesia: Unmedicated, IV Fentanyl given prior to lower uterine sweep   Infant:  APGAR (1 MIN):  7 APGAR (5 MINS):  9  Sundra Aland, MD OB Fellow, Faculty Practice [facility], Center for [facility]

## 2023-02-28 ENCOUNTER — Encounter (HOSPITAL_COMMUNITY): Payer: Self-pay | Admitting: *Deleted

## 2023-02-28 ENCOUNTER — Inpatient Hospital Stay (HOSPITAL_COMMUNITY): Payer: Self-pay

## 2023-02-28 ENCOUNTER — Inpatient Hospital Stay (HOSPITAL_COMMUNITY)
Admission: AD | Admit: 2023-02-28 | Discharge: 2023-02-28 | Disposition: A | Discharge status: Home / Self Care | Payer: Self-pay | Attending: Family Medicine | Admitting: Family Medicine

## 2023-02-28 ENCOUNTER — Other Ambulatory Visit: Payer: Self-pay

## 2023-02-28 DIAGNOSIS — Z3A08 8 weeks gestation of pregnancy: Secondary | ICD-10-CM | POA: Insufficient documentation

## 2023-02-28 DIAGNOSIS — O209 Hemorrhage in early pregnancy, unspecified: Secondary | ICD-10-CM | POA: Insufficient documentation

## 2023-02-28 DIAGNOSIS — D259 Leiomyoma of uterus, unspecified: Secondary | ICD-10-CM | POA: Insufficient documentation

## 2023-02-28 DIAGNOSIS — O26891 Other specified pregnancy related conditions, first trimester: Secondary | ICD-10-CM | POA: Insufficient documentation

## 2023-02-28 DIAGNOSIS — O3411 Maternal care for benign tumor of corpus uteri, first trimester: Secondary | ICD-10-CM | POA: Insufficient documentation

## 2023-02-28 DIAGNOSIS — Z3491 Encounter for supervision of normal pregnancy, unspecified, first trimester: Secondary | ICD-10-CM

## 2023-02-28 DIAGNOSIS — Z3A01 Less than 8 weeks gestation of pregnancy: Secondary | ICD-10-CM

## 2023-02-28 DIAGNOSIS — Z87891 Personal history of nicotine dependence: Secondary | ICD-10-CM | POA: Insufficient documentation

## 2023-02-28 DIAGNOSIS — R1030 Lower abdominal pain, unspecified: Secondary | ICD-10-CM | POA: Insufficient documentation

## 2023-02-28 DIAGNOSIS — N939 Abnormal uterine and vaginal bleeding, unspecified: Secondary | ICD-10-CM

## 2023-02-28 LAB — URINALYSIS, ROUTINE W REFLEX MICROSCOPIC
Bilirubin Urine: NEGATIVE
Glucose, UA: NEGATIVE mg/dL
Ketones, ur: NEGATIVE mg/dL
Leukocytes,Ua: NEGATIVE
Nitrite: NEGATIVE
Protein, ur: NEGATIVE mg/dL
Specific Gravity, Urine: 1.01 (ref 1.005–1.030)
pH: 5 (ref 5.0–8.0)

## 2023-02-28 LAB — CBC
HCT: 36.9 % (ref 36.0–46.0)
Hemoglobin: 12.1 g/dL (ref 12.0–15.0)
MCH: 27.8 pg (ref 26.0–34.0)
MCHC: 32.8 g/dL (ref 30.0–36.0)
MCV: 84.6 fL (ref 80.0–100.0)
Platelets: 368 10*3/uL (ref 150–400)
RBC: 4.36 MIL/uL (ref 3.87–5.11)
RDW: 14.1 % (ref 11.5–15.5)
WBC: 8.5 10*3/uL (ref 4.0–10.5)
nRBC: 0 % (ref 0.0–0.2)

## 2023-02-28 LAB — WET PREP, GENITAL
Clue Cells Wet Prep HPF POC: NONE SEEN
Sperm: NONE SEEN
Trich, Wet Prep: NONE SEEN
WBC, Wet Prep HPF POC: >=10 — AB (ref <10)
Yeast Wet Prep HPF POC: NONE SEEN

## 2023-02-28 LAB — HCG, QUANTITATIVE, PREGNANCY: hCG, Beta Chain, Quant, S: 80795 m[IU]/mL — ABNORMAL HIGH (ref <5)

## 2023-02-28 LAB — POCT PREGNANCY, URINE: Preg Test, Ur: POSITIVE — AB

## 2023-02-28 NOTE — Discharge Instructions (Signed)
Prenatal Care Providers           [facility]  [address] [phone]  [facility]   [address]  [phone]  [facility] [place]       [address] [phone]            [facility] [place]     [address] [phone]          [facility] [place]   [address] [phone]  [facility]  [address] [phone]     [facility]  [address]   [phone]     [facility]  Phone: [phone]  [facility]  Phone: [phone]  [facility] Phone: [phone]  [facility] Phone: [phone]  [facility] Phone: [phone]  [facility] Phone: [phone]  [facility]  Phone: [phone] Safe Medications in Pregnancy   Acne: Benzoyl Peroxide Salicylic Acid  Backache/Headache: Tylenol: 2 regular strength every 4 hours OR              2 Extra strength every 6 hours  Colds/Coughs/Allergies: Benadryl (alcohol free) 25 mg every 6 hours as needed Breath right strips Claritin Cepacol throat lozenges Chloraseptic throat spray Cold-Eeze- up to three times per day Cough drops, alcohol free Flonase (by prescription only) Guaifenesin Mucinex Robitussin DM (plain only, alcohol free) Saline nasal spray/drops Sudafed (pseudoephedrine) & Actifed ** use only after [redacted] weeks gestation and if you do not have high blood pressure Tylenol Vicks Vaporub Zinc lozenges Zyrtec   Constipation: Colace Ducolax suppositories Fleet enema Glycerin suppositories Metamucil Milk of magnesia Miralax Senokot Smooth move tea  Diarrhea: Kaopectate Imodium A-D  *NO pepto  Bismol  Hemorrhoids: Anusol Anusol HC Preparation H Tucks  Indigestion: Tums Maalox Mylanta Zantac  Pepcid  Insomnia: Benadryl (alcohol free) 25mg every 6 hours as needed Tylenol PM Unisom, no Gelcaps  Leg Cramps: Tums MagGel  Nausea/Vomiting:  Bonine Dramamine Emetrol Ginger extract Sea bands Meclizine  Nausea medication to take during pregnancy:  Unisom (doxylamine succinate 25 mg tablets) Take one tablet daily at bedtime. If symptoms are not adequately controlled, the dose can be increased to a maximum recommended dose of two tablets daily (1/2 tablet in the morning, 1/2 tablet mid-afternoon and one at bedtime). Vitamin B6 100mg tablets. Take one tablet twice a day (up to 200 mg per day).  Skin Rashes: Aveeno products Benadryl cream or 25mg every 6 hours as needed Calamine Lotion 1% cortisone cream  Yeast infection: Gyne-lotrimin 7 Monistat 7   **If taking multiple medications, please check labels to avoid duplicating the same active ingredients **take medication as directed on the label ** Do not exceed 4000 mg of tylenol in 24 hours **Do not take medications that contain aspirin or ibuprofen    

## 2023-02-28 NOTE — MAU Note (Signed)
Bonnie Morales is a 42 y.o. at Unknown here in MAU reporting: she began having VB bleeding this morning @ 0400.  Reports VB saturated crotch of underwear, but now only having spotting.  Denies recent intercourse.  Reports mild cramping. LMP: 01/07/2023 Onset of complaint: today Pain score: 3 Vitals:   02/28/23 0930  BP: 127/73  Pulse: 62  Resp: 19  Temp: 97.8 F (36.6 C)  SpO2: 100%     FHT:NA Lab orders placed from triage:   UPT & UA

## 2023-02-28 NOTE — MAU Provider Note (Signed)
History     CSN: [id number]  Arrival date and time: 02/28/23 7829  Chief Complaint  Patient presents with   Vaginal Bleeding   HPI  Bonnie Morales is a 42 y.o. [id number] at [redacted]w[redacted]d who presents for evaluation of vaginal bleeding. Patient reports at 0400 she had one gush of bleeding but now is only having spotting when she wipes. She reports intermittent lower abdominal cramping. Patient rates the pain as a 3/10 and has not tried anything for the pain. She denies any discharge. Denies any constipation, diarrhea or any urinary complaints.   OB History     Gravida  5   Para  4   Term  3   Preterm  1   AB  0   Living  4      SAB  0   IAB  0   Ectopic  0   Multiple  0   Live Births  4           Past Medical History:  Diagnosis Date   Chronic hypertension    Gestational diabetes    History of kidney stones    passed stone   Hypertension    2014   Pre-eclampsia     Past Surgical History:  Procedure Laterality Date   CERVICAL CERCLAGE N/A 04/12/2017   Procedure: CERCLAGE CERVICAL;  Surgeon: Hermina Staggers, MD;  Location: [facility];  Service: Gynecology;  Laterality: N/A;   CERVICAL CERCLAGE N/A 07/25/2021   Procedure: CERCLAGE CERVICAL;  Surgeon: Hermina Staggers, MD;  Location: [facility] LD ORS;  Service: Gynecology;  Laterality: N/A;   CESAREAN SECTION     epidural - in [place]    Family History  Problem Relation Age of Onset   Diabetes Mother    Hypertension Mother    Heart disease Mother        tachycardia   Hypertension Father    Mental retardation Sister    Cancer Paternal Grandmother     Social History   Tobacco Use   Smoking status: Former    Packs/day: 0.25    Years: 5.00    Additional pack years: 0.00    Total pack years: 1.25    Types: Cigarettes    Quit date: 09/12/2016    Years since quitting: 6.4   Smokeless tobacco: Never  Vaping Use   Vaping Use: Never used  Substance Use Topics   Alcohol use: No   Drug use: No    Allergies:   Allergies  Allergen Reactions   Latex     burning   Sulfa Antibiotics Hives    Reports high fever and red rash on arms    No medications prior to admission.    Review of Systems  Constitutional: Negative.  Negative for fatigue and fever.  HENT: Negative.    Respiratory: Negative.  Negative for shortness of breath.   Cardiovascular: Negative.  Negative for chest pain.  Gastrointestinal: Negative.  Negative for abdominal pain, constipation, diarrhea, nausea and vomiting.  Genitourinary:  Positive for vaginal bleeding. Negative for dysuria and vaginal discharge.  Neurological: Negative.  Negative for dizziness and headaches.   Physical Exam   Blood pressure 127/73, pulse 62, temperature 97.8 F (36.6 C), temperature source Oral, resp. rate 19, height  (1.651 m), weight 91.3 kg, last menstrual period 01/07/2023, SpO2 100 %, currently breastfeeding.  Patient Vitals for the past 24 hrs:  BP Temp Temp src Pulse Resp SpO2 Height Weight  02/28/23 0930 127/73 97.8  F (36.6 C) Oral 62 19 100 %  (1.651 m) --  02/28/23 0923 -- -- -- -- -- -- -- 91.3 kg    Physical Exam Vitals and nursing note reviewed.  Constitutional:      General: She is not in acute distress.    Appearance: She is well-developed.  HENT:     Head: Normocephalic.  Eyes:     Pupils: Pupils are equal, round, and reactive to light.  Cardiovascular:     Rate and Rhythm: Normal rate and regular rhythm.     Heart sounds: Normal heart sounds.  Pulmonary:     Effort: Pulmonary effort is normal. No respiratory distress.     Breath sounds: Normal breath sounds.  Abdominal:     General: Bowel sounds are normal. There is no distension.     Palpations: Abdomen is soft.     Tenderness: There is no abdominal tenderness.  Skin:    General: Skin is warm and dry.  Neurological:     Mental Status: She is alert and oriented to person, place, and time.  Psychiatric:        Mood and Affect: Mood normal.         Behavior: Behavior normal.        Thought Content: Thought content normal.        Judgment: Judgment normal.       MAU Course  Procedures  Results for orders placed or performed during the hospital encounter of 02/28/23 (from the past 24 hour(s))  Pregnancy, urine POC     Status: Abnormal   Collection Time: 02/28/23  9:17 AM  Result Value Ref Range   Preg Test, Ur POSITIVE (A) NEGATIVE  Urinalysis, Routine w reflex microscopic -Urine, Clean Catch     Status: Abnormal   Collection Time: 02/28/23  9:18 AM  Result Value Ref Range   Color, Urine YELLOW YELLOW   APPearance HAZY (A) CLEAR   Specific Gravity, Urine 1.010 1.005 - 1.030   pH 5.0 5.0 - 8.0   Glucose, UA NEGATIVE NEGATIVE mg/dL   Hgb urine dipstick LARGE (A) NEGATIVE   Bilirubin Urine NEGATIVE NEGATIVE   Ketones, ur NEGATIVE NEGATIVE mg/dL   Protein, ur NEGATIVE NEGATIVE mg/dL   Nitrite NEGATIVE NEGATIVE   Leukocytes,Ua NEGATIVE NEGATIVE   RBC / HPF 0-5 0 - 5 RBC/hpf   WBC, UA 0-5 0 - 5 WBC/hpf   Bacteria, UA RARE (A) NONE SEEN   Squamous Epithelial / HPF 11-20 0 - 5 /HPF   Mucus PRESENT   Wet prep, genital     Status: Abnormal   Collection Time: 02/28/23  9:40 AM   Specimen: PATH Cytology Cervicovaginal Ancillary Only  Result Value Ref Range   Yeast Wet Prep HPF POC NONE SEEN NONE SEEN   Trich, Wet Prep NONE SEEN NONE SEEN   Clue Cells Wet Prep HPF POC NONE SEEN NONE SEEN   WBC, Wet Prep HPF POC >=10 (A) <10   Sperm NONE SEEN   CBC     Status: None   Collection Time: 02/28/23  9:42 AM  Result Value Ref Range   WBC 8.5 4.0 - 10.5 K/uL   RBC 4.36 3.87 - 5.11 MIL/uL   Hemoglobin 12.1 12.0 - 15.0 g/dL   HCT 16.1 09.6 - 04.5 %   MCV 84.6 80.0 - 100.0 fL   MCH 27.8 26.0 - 34.0 pg   MCHC 32.8 30.0 - 36.0 g/dL   RDW 40.9 81.1 - 91.4 %  Platelets 368 150 - 400 K/uL   nRBC 0.0 0.0 - 0.2 %  hCG, quantitative, pregnancy     Status: Abnormal   Collection Time: 02/28/23  9:42 AM  Result Value Ref Range   hCG,  Beta Chain, Quant, S 80,795 (H) <5 mIU/mL     US OB LESS THAN 14 WEEKS WITH OB TRANSVAGINAL  Result Date: 02/28/2023 CLINICAL DATA:  [id number] Vaginal bleeding affecting early pregnancy [id number] EXAM: OBSTETRIC <14 WK [place] AND TRANSVAGINAL OB US TECHNIQUE: Both transabdominal and transvaginal ultrasound examinations were performed for complete evaluation of the gestation as well as the maternal uterus, adnexal regions, and pelvic cul-de-sac. Transvaginal technique was performed to assess early pregnancy. COMPARISON:  None Available. FINDINGS: Intrauterine gestational sac: Single Yolk sac:  Visualized. Embryo:  Visualized. Cardiac Activity: Visualized. Heart Rate: 167 bpm CRL:  17.9 mm   8 w   2 d                  [place] EDC: 10/08/2023 Subchorionic hemorrhage:  None visualized. Maternal uterus/adnexae: Right uterine body fibroid measuring 5.2 x 3.5 x 4.2 cm. Unremarkable appearance of the bilateral ovaries and adnexal regions. No free fluid within the pelvis. IMPRESSION: 1. Single live intrauterine gestation measuring 8 weeks 2 days by crown-rump length. 2. Active heart tones at 167 BPM. 3. Uterine fibroid. Electronically Signed   By: Duanne Guess D.O.   On: 02/28/2023 10:16     MDM Labs ordered and reviewed.   UA, UPT CBC, HCG ABO/Rh- O Pos Wet prep and gc/chlamydia US OB Comp Less 14 weeks with Transvaginal   CNM independently reviewed the imaging ordered. Imaging show live IUP consistent with dates  Assessment and Plan   1. Normal intrauterine pregnancy on prenatal ultrasound in first trimester   2. [redacted] weeks gestation of pregnancy   3. Vaginal spotting     -Discharge home in stable condition -First trimester precautions discussed -Patient advised to follow-up with OB to establish prenatal care -Patient may return to MAU as needed or if her condition were to change or worsen  Rolm Bookbinder, CNM 02/28/2023, 9:33 AM

## 2023-03-01 LAB — GC/CHLAMYDIA PROBE AMP (~~LOC~~) NOT AT ARMC
Chlamydia: NEGATIVE
Comment: NEGATIVE
Comment: NORMAL
Neisseria Gonorrhea: NEGATIVE

## 2023-03-26 ENCOUNTER — Telehealth (INDEPENDENT_AMBULATORY_CARE_PROVIDER_SITE_OTHER): Payer: Self-pay

## 2023-03-26 DIAGNOSIS — O0990 Supervision of high risk pregnancy, unspecified, unspecified trimester: Secondary | ICD-10-CM

## 2023-03-26 DIAGNOSIS — Z3689 Encounter for other specified antenatal screening: Secondary | ICD-10-CM

## 2023-03-26 NOTE — Progress Notes (Signed)
New OB Intake  I connected with Bonnie Morales  on 03/26/23 at 11:15 AM EDT by MyChart Video Visit and verified that I am speaking with the correct person using two identifiers. Nurse is located at [facility] and pt is located at home. [id number] [address] AM[employer] Language interpreter assist with the call.   I discussed the limitations, risks, security and privacy concerns of performing an evaluation and management service by telephone and the availability of in person appointments. I also discussed with the patient that there may be a patient responsible charge related to this service. The patient expressed understanding and agreed to proceed.  I explained I am completing New OB Intake today. We discussed EDD of 10/14/2023 that is based on LMP of 01/07/2023. Pt is G5/P3104. I reviewed her allergies, medications, Medical/Surgical/OB history, and appropriate screenings. I informed her of [facility] services. [facility] information placed in AVS. Based on history, this is a high risk pregnancy. Patient Active Problem List   Diagnosis Date Noted   Supervision of high risk pregnancy, antepartum 03/26/2023   Chronic hypertension affecting pregnancy 12/28/2021   AMA (advanced maternal age) multigravida 35+ 12/28/2021   history of gestational diabetes 07/06/2021   Chronic hypertension 09/24/2017   History of cervical incompetence 09/24/2017   Language barrier 06/17/2017   History of VBAC 04/09/2017   History of eclampsia 04/09/2017    Concerns addressed today: -Would like to talk about BTL  -Patient would like a VBAC with this pregnancy.  -Due to hx of cerclage placed in 2048 and 2022, scheduled a OB MFM Transvaginal on 04/01/2023 at 1:30 PM for cervical length exam.   Delivery Plans Plans to deliver at [facility]. Patient given information for [facility] Healthy Baby website for more information about [facility]. Patient is not interested in water birth.   MyChart/Babyscripts MyChart access verified. I  explained pt will have some visits in office and some virtually. Babyscripts instructions given and order placed. Patient verifies receipt of registration text/e-mail. Account successfully created and app downloaded.  Blood Pressure Cuff/Weight Scale Patient is self-pay; explained patient will be given BP cuff at first prenatal appt. Explained after first prenatal appt pt will check weekly and document in Babyscripts. Patient does have weight scale.  Anatomy US Explained first scheduled [place] will be around 19 weeks. Anatomy US scheduled for 05/20/2023 at 2:30PM. Pt notified to arrive at 2:15PM.  Labs Discussed Natera genetic screening with patient. Would like Panorama. Horizon was completed on 06/21/2021 negative.   COVID Vaccine Patient has had COVID vaccine.   Is patient a CenteringPregnancy candidate?  Declined Declined due to Schedule    Is patient a Mom+Baby Combined Care candidate?  Not a candidate     Social Determinants of Health Food Insecurity: Patient denies food insecurity. WIC Referral: Patient is interested in referral to [facility].  Transportation: Patient denies transportation needs. Childcare: Discussed no children allowed at ultrasound appointments. Offered childcare services; patient declines childcare services at this time.  Interested in [place]? If yes, send referral and doula dot phrase.   First visit review I reviewed new OB appt with patient. I explained they will have a provider visit that includes initial OB labs, Panorama screening, Hemoglobin A1C, OB Culture, GC/CH, and Pap smear. Explained pt will be seen by Corlis Hove NP at first visit; encounter routed to appropriate provider. Explained that patient will be seen by pregnancy navigator following visit with provider.   Vidal Schwalbe, [place] 03/26/2023  9:46 AM

## 2023-04-01 ENCOUNTER — Ambulatory Visit: Payer: Self-pay | Attending: Maternal & Fetal Medicine | Admitting: Maternal & Fetal Medicine

## 2023-04-01 ENCOUNTER — Ambulatory Visit: Payer: Self-pay | Attending: Student

## 2023-04-01 ENCOUNTER — Other Ambulatory Visit: Payer: Self-pay | Admitting: Student

## 2023-04-01 ENCOUNTER — Other Ambulatory Visit: Payer: Self-pay

## 2023-04-01 DIAGNOSIS — O09521 Supervision of elderly multigravida, first trimester: Secondary | ICD-10-CM

## 2023-04-01 DIAGNOSIS — O10919 Unspecified pre-existing hypertension complicating pregnancy, unspecified trimester: Secondary | ICD-10-CM

## 2023-04-01 DIAGNOSIS — Z3A12 12 weeks gestation of pregnancy: Secondary | ICD-10-CM

## 2023-04-01 DIAGNOSIS — O10911 Unspecified pre-existing hypertension complicating pregnancy, first trimester: Secondary | ICD-10-CM

## 2023-04-01 DIAGNOSIS — O0990 Supervision of high risk pregnancy, unspecified, unspecified trimester: Secondary | ICD-10-CM

## 2023-04-01 DIAGNOSIS — Z8742 Personal history of other diseases of the female genital tract: Secondary | ICD-10-CM

## 2023-04-01 DIAGNOSIS — O09291 Supervision of pregnancy with other poor reproductive or obstetric history, first trimester: Secondary | ICD-10-CM

## 2023-04-02 NOTE — Progress Notes (Signed)
Patient information  Patient Name: Bonnie Morales  Patient MRN:   [id number]  Referring practice: MFM Referring Provider: [facility])  MFM CONSULT  Bonnie Morales is a 42 y.o. [id number] at [redacted]w[redacted]d here for ultrasound and consultation.   The patient has a history of two prior cervical cerclage (G3 and G4 - both carried to 37 weeks).  Her first cerclage was placed in 2018 with her third pregnancy.  It appears she was 19 weeks 3 days with a progressively shortened cervix and a cervical length of 1.4 to 2 cm.  During the procedure was noted she was dilated to 1 cm.  At her next pregnancy she said she was just "scheduled for the procedure ".  She was told by some provider that they did not think she needed the first cerclage since it was originally based on a cervical length without a history of early preterm birth < 34 weeks..  She strongly desires to avoid the procedure since she has difficulty with anesthesia.  Her blood pressure dropped significantly during the spinal and she is worried about this happening again.  I discussed that she could consult with anesthesia to see if there are ways to mitigate this side effect. I discussed that the recommendations are typically to have a cerclage in subsequent pregnancies if she has had a previous one.  However, she has delivered a 43 and 38-week newborn without a cerclage in her first and second pregnancy and it is not typical to have cervical insufficiency after two near term deliveries.  I discussed the options of expectant management, serial cervical length measurements or a history indicated cerclage.  The patient verbalized that she would prefer to have a cervical length at 16 weeks and then again at her anatomy ultrasound and set up a cerclage.   The patient verbalized understanding and requested to proceed with serial cervical lengths.  Due to advanced maternal age and chronic hypertension she will need a detailed  anatomic survey as well as serial growth ultrasounds and antenatal testing starting around 32 weeks.  Sonographic findings Single intrauterine pregnancy. Fetal cardiac activity:  Observed and appears normal. The anatomic structures that were well seen appear normal without evidence of soft markers but are very limited due to early gestational age. Not all birth defects will be detectable at this gestational age.   Amniotic fluid volume: Within normal limits.   Placenta: Posterior. Cervical length is not measurable at this gestational age.   Assessment 1. History of cervical incompetence 2. Chronic hypertension affecting pregnancy 3. [redacted] weeks gestation of pregnancy Plan -Limited early anatomy appears normal without gross abnormalities but will need verification a detailed anatomic survey -Cervical cerclage was offered but declined.  Transvaginal cervical length will be done at 16 weeks and then again at her anatomy survey around 19 to 20 weeks -Baseline preeclampsia labs: CMP, CBC and urine protein/creatinine ratio if not previously completed.  -Early glucose screening due to multiple risk factors. -Start Aspirin 81 mg for preeclampsia prophylasis -Serial growth ultrasounds starting around 28 weeks to monitor for fetal growth restriction -Antenatal testing to start around 32 weeks due to the increased risk of stillbirth and high risk pregnancy -Delivery timing pending clinical course but likely around 37-39 weeks gestion -Continue routine prenatal care with referring OB provider  Review of Systems: A review of systems was performed and was negative except per HPI   Vitals and Physical Exam    02/28/2023  9:30 AM 02/28/2023    9:23 AM 01/26/2022    9:25 AM  Vitals with BMI  Height 5\' 5"     Weight  201 lbs 3 oz 193 lbs 8 oz  BMI   32.2  Systolic 127  132  Diastolic 73  66  Pulse 62  64  Sitting comfortably on the sonogram table Nonlabored breathing Normal rate and  rhythm Abdomen is nontender  Past pregnancies OB History  Gravida Para Term Preterm AB Living  5 4 3 1  0 4  SAB IAB Ectopic Multiple Live Births  0 0 0 0 4    # Outcome Date GA Lbr Len/2nd Weight Sex Delivery Anes PTL Lv  5 Current           4 Term 12/28/21 [redacted]w[redacted]d 08:00 / 00:11 6 lb 7.7 oz (2.94 kg) F VBAC EPI  LIV  3 Term 08/17/17 [redacted]w[redacted]d 07:48 / 00:25 6 lb 6.8 oz (2.914 kg) F VBAC EPI  LIV  2 Term 08/28/15 [redacted]w[redacted]d 05:04 / 03:28 6 lb 5.6 oz (2.88 kg) M Vag-Spont EPI  LIV  1 Preterm 05/25/04 [redacted]w[redacted]d  4 lb 6.6 oz (2 kg)  CS-Unspec   LIV     Complications: Placenta Previa    I spent 45 minutes reviewing the patients chart, including labs and images as well as counseling the patient about her medical conditions. Greater than 50% of the time was spent in direct face-to-face patient counseling.  Braxton Feathers  MFM, [facility]   04/02/2023  9:28 AM

## 2023-04-04 ENCOUNTER — Encounter: Payer: Self-pay | Admitting: Student

## 2023-04-04 ENCOUNTER — Other Ambulatory Visit: Payer: Self-pay

## 2023-04-04 ENCOUNTER — Other Ambulatory Visit (HOSPITAL_COMMUNITY)
Admission: RE | Admit: 2023-04-04 | Discharge: 2023-04-04 | Disposition: A | Discharge status: Home / Self Care | Payer: Self-pay | Source: Ambulatory Visit | Attending: Obstetrics and Gynecology | Admitting: Obstetrics and Gynecology

## 2023-04-04 ENCOUNTER — Ambulatory Visit (INDEPENDENT_AMBULATORY_CARE_PROVIDER_SITE_OTHER): Payer: Self-pay | Admitting: Student

## 2023-04-04 VITALS — BP 132/69 | HR 60 | Wt 200.7 lb

## 2023-04-04 DIAGNOSIS — Z8759 Personal history of other complications of pregnancy, childbirth and the puerperium: Secondary | ICD-10-CM

## 2023-04-04 DIAGNOSIS — Z603 Acculturation difficulty: Secondary | ICD-10-CM

## 2023-04-04 DIAGNOSIS — O09521 Supervision of elderly multigravida, first trimester: Secondary | ICD-10-CM

## 2023-04-04 DIAGNOSIS — O0990 Supervision of high risk pregnancy, unspecified, unspecified trimester: Secondary | ICD-10-CM

## 2023-04-04 DIAGNOSIS — Z758 Other problems related to medical facilities and other health care: Secondary | ICD-10-CM

## 2023-04-04 DIAGNOSIS — Z98891 History of uterine scar from previous surgery: Secondary | ICD-10-CM

## 2023-04-04 DIAGNOSIS — O10919 Unspecified pre-existing hypertension complicating pregnancy, unspecified trimester: Secondary | ICD-10-CM

## 2023-04-04 DIAGNOSIS — Z8632 Personal history of gestational diabetes: Secondary | ICD-10-CM

## 2023-04-04 DIAGNOSIS — Z8742 Personal history of other diseases of the female genital tract: Secondary | ICD-10-CM

## 2023-04-04 MED ORDER — ASPIRIN 81 MG PO TBEC
81.0000 mg | DELAYED_RELEASE_TABLET | Freq: Every day | ORAL | 2 refills | Status: DC
Start: 2023-04-04 — End: 2023-09-27

## 2023-04-05 LAB — CBC/D/PLT+RPR+RH+ABO+RUBIGG...
Antibody Screen: NEGATIVE
Basophils Absolute: 0 10*3/uL (ref 0.0–0.2)
Basos: 0 %
EOS (ABSOLUTE): 0.1 10*3/uL (ref 0.0–0.4)
Eos: 1 %
HCV Ab: NONREACTIVE
HIV Screen 4th Generation wRfx: NONREACTIVE
Hematocrit: 37.7 % (ref 34.0–46.6)
Hemoglobin: 12.5 g/dL (ref 11.1–15.9)
Hepatitis B Surface Ag: NEGATIVE
Immature Grans (Abs): 0 10*3/uL (ref 0.0–0.1)
Immature Granulocytes: 0 %
Lymphocytes Absolute: 1.9 10*3/uL (ref 0.7–3.1)
Lymphs: 24 %
MCH: 27.7 pg (ref 26.6–33.0)
MCHC: 33.2 g/dL (ref 31.5–35.7)
MCV: 84 fL (ref 79–97)
Monocytes Absolute: 0.4 10*3/uL (ref 0.1–0.9)
Monocytes: 5 %
Neutrophils Absolute: 5.5 10*3/uL (ref 1.4–7.0)
Neutrophils: 70 %
Platelets: 349 10*3/uL (ref 150–450)
RBC: 4.51 x10E6/uL (ref 3.77–5.28)
RDW: 14.7 % (ref 11.7–15.4)
RPR Ser Ql: NONREACTIVE
Rh Factor: POSITIVE
Rubella Antibodies, IGG: 4.24 index (ref >0.99)
WBC: 7.8 10*3/uL (ref 3.4–10.8)

## 2023-04-05 LAB — COMPREHENSIVE METABOLIC PANEL
ALT: 10 IU/L (ref 0–32)
AST: 13 IU/L (ref 0–40)
Albumin/Globulin Ratio: 1.2 (ref 1.2–2.2)
Albumin: 4 g/dL (ref 3.9–4.9)
Alkaline Phosphatase: 77 IU/L (ref 44–121)
BUN/Creatinine Ratio: 16 (ref 9–23)
BUN: 7 mg/dL (ref 6–24)
Bilirubin Total: 0.2 mg/dL (ref 0.0–1.2)
CO2: 21 mmol/L (ref 20–29)
Calcium: 9.2 mg/dL (ref 8.7–10.2)
Chloride: 102 mmol/L (ref 96–106)
Creatinine, Ser: 0.43 mg/dL — ABNORMAL LOW (ref 0.57–1.00)
Globulin, Total: 3.3 g/dL (ref 1.5–4.5)
Glucose: 108 mg/dL — ABNORMAL HIGH (ref 70–99)
Potassium: 3.7 mmol/L (ref 3.5–5.2)
Sodium: 136 mmol/L (ref 134–144)
Total Protein: 7.3 g/dL (ref 6.0–8.5)
eGFR: 126 mL/min/{1.73_m2} (ref >59)

## 2023-04-05 LAB — HCV INTERPRETATION

## 2023-04-05 LAB — HEMOGLOBIN A1C
Est. average glucose Bld gHb Est-mCnc: 123 mg/dL
Hgb A1c MFr Bld: 5.9 % — ABNORMAL HIGH (ref 4.8–5.6)

## 2023-04-05 NOTE — Progress Notes (Signed)
History:   Bonnie Morales is a 42 y.o. [id number] at [redacted]w[redacted]d by LMP being seen today for her first obstetrical visit.  Her obstetrical history is significant for advanced maternal age, obesity, pre-eclampsia, and successful vbac . Patient does intend to breast feed. Pregnancy history fully reviewed.  Patient reports no complaints.      HISTORY: OB History  Gravida Para Term Preterm AB Living  5 4 3 1  0 4  SAB IAB Ectopic Multiple Live Births  0 0 0 0 4    # Outcome Date GA Lbr Len/2nd Weight Sex Delivery Anes PTL Lv  5 Current           4 Term 12/28/21 [redacted]w[redacted]d 08:00 / 00:11 6 lb 7.7 oz (2.94 kg) F VBAC EPI  LIV     Name: [name],GIRL [place]     Apgar1: 9  Apgar5: 9  3 Term 08/17/17 [redacted]w[redacted]d 07:48 / 00:25 6 lb 6.8 oz (2.914 kg) F VBAC EPI  LIV     Name: [name],GIRL [place]     Apgar1: 9  Apgar5: 9  2 Term 08/28/15 [redacted]w[redacted]d 05:04 / 03:28 6 lb 5.6 oz (2.88 kg) M Vag-Spont EPI  LIV     Name: [name],BOY Deliah     Apgar1: 8  Apgar5: 9  1 Preterm 05/25/04 [redacted]w[redacted]d  4 lb 6.6 oz (2 kg)  CS-Unspec   LIV     Complications: Placenta Previa    Last pap smear was done 08/2019 and was normal  Past Medical History:  Diagnosis Date   AMA (advanced maternal age) multigravida 35+ 12/28/2021   Chronic hypertension    Gestational diabetes    History of kidney stones    passed stone   History of VBAC 04/09/2017   c-section in [place]  VBAC x 1     Consent signed 08/01/2017   Hypertension    2014   Language barrier 06/17/2017   Pre-eclampsia    Past Surgical History:  Procedure Laterality Date   CERVICAL CERCLAGE N/A 04/12/2017   Procedure: CERCLAGE CERVICAL;  Surgeon: Hermina Staggers, MD;  Location: [facility];  Service: Gynecology;  Laterality: N/A;   CERVICAL CERCLAGE N/A 07/25/2021   Procedure: CERCLAGE CERVICAL;  Surgeon: Hermina Staggers, MD;  Location: [facility] ORS;  Service: Gynecology;  Laterality: N/A;   CESAREAN SECTION     epidural - in [place]   Family History  Problem  Relation Age of Onset   Diabetes Mother    Hypertension Mother    Heart disease Mother        tachycardia   Hypertension Father    Mental retardation Sister    Cancer Paternal Grandmother    Social History   Tobacco Use   Smoking status: Former    Packs/day: 0.25    Years: 5.00    Additional pack years: 0.00    Total pack years: 1.25    Types: Cigarettes    Quit date: 09/12/2016    Years since quitting: 6.5   Smokeless tobacco: Never  Vaping Use   Vaping Use: Never used  Substance Use Topics   Alcohol use: No   Drug use: No   Allergies  Allergen Reactions   Latex     burning   Sulfa Antibiotics Hives    Reports high fever and red rash on arms   Current Outpatient Medications on File Prior to Visit  Medication Sig Dispense Refill   amLODipine (NORVASC) 10 MG tablet Take 1 tablet (  10 mg total) by mouth daily. 90 tablet 3   Prenatal Vit-Fe Fumarate-FA (PRENATAL MULTIVITAMIN) TABS tablet Take 1 tablet by mouth daily.     acetaminophen (TYLENOL) 500 MG tablet Take 5,000 mg by mouth every 6 (six) hours as needed. (Patient not taking: Reported on 04/04/2023)     ferrous sulfate 325 (65 FE) MG tablet Take 1 tablet (325 mg total) by mouth every other day. 30 tablet 0   No current facility-administered medications on file prior to visit.   Indications for ASA therapy (per uptodate) One of the following: Previous pregnancy with preeclampsia, especially early onset and with an adverse outcome Yes Multifetal gestation No Chronic hypertension Yes Type 1 or 2 diabetes mellitus No Chronic kidney disease No Autoimmune disease (antiphospholipid syndrome, systemic lupus erythematosus) No  Two or more of the following: Nulliparity No Obesity (body mass index >30 kg/m2) Yes Family history of preeclampsia in mother or sister No Age ?35 years Yes Sociodemographic characteristics (African American race, low socioeconomic level) Yes Personal risk factors (eg, previous pregnancy with  low birth weight or small for gestational age infant, previous adverse pregnancy outcome [eg, stillbirth], interval >10 years between pregnancies) No  Indications for early GDM screening  First-degree relative with diabetes Yes BMI >30kg/m2 Yes Age > 35 Yes Previous birth of an infant weighing ?4000 g No Gestational diabetes mellitus in a previous pregnancy No Glycated hemoglobin ?5.7 percent (39 mmol/mol), impaired glucose tolerance, or impaired fasting glucose on previous testing Yes High-risk race/ethnicity (eg, African American, Latino, Native American, [place] American, Pacific Islander) Yes Previous stillbirth of unknown cause No Maternal birthweight > 9 lbs No History of cardiovascular disease No Hypertension or on therapy for hypertension Yes High-density lipoprotein cholesterol level <35 mg/dL (1.61 mmol/L) and/or a triglyceride level >250 mg/dL (0.96 mmol/L) No Polycystic ovary syndrome No Physical inactivity Yes Other clinical condition associated with insulin resistance (eg, severe obesity, acanthosis nigricans) Yes Current use of glucocorticoids No   Early screening tests: FBS, A1C, Random CBG, glucose challenge  Review of Systems Pertinent items noted in HPI and remainder of comprehensive ROS otherwise negative. Physical Exam:   Vitals:   04/04/23 1027  BP: 132/69  Pulse: 60  Weight: 200 lb 11.2 oz (91 kg)     Uterus:     Pelvic Exam: Perineum: no hemorrhoids, normal perineum   Vulva: normal external genitalia, no lesions   Vagina:  normal mucosa, normal discharge   Cervix: no lesions and normal, pap smear done.    Adnexa: normal adnexa and no mass, fullness, tenderness   Bony Pelvis: average  System: General: well-developed, well-nourished female in no acute distress   Breasts:  normal appearance, no masses or tenderness bilaterally   Skin: normal coloration and turgor, no rashes   Neurologic: oriented, normal, negative, normal mood   Extremities: normal  strength, tone, and muscle mass, ROM of all joints is normal   HEENT PERRLA, extraocular movement intact and sclera clear, anicteric   Mouth/Teeth mucous membranes moist, pharynx normal without lesions and dental hygiene good   Neck supple and no masses   Cardiovascular: regular rate and rhythm   Respiratory:  no respiratory distress, normal breath sounds   Abdomen: soft, non-tender; bowel sounds normal; no masses,  no organomegaly  FHR checked via ultrasound  Assessment:    Pregnancy: [id number] Patient Active Problem List   Diagnosis Date Noted   Supervision of high risk pregnancy, antepartum 03/26/2023   history of gestational diabetes 07/06/2021   History of  cervical incompetence (cerclage in G3 and G4) 09/24/2017   Chronic hypertension affecting pregnancy 09/24/2017   Language barrier 06/17/2017     Plan:    1. Supervision of high risk pregnancy, antepartum - Culture, OB Urine - CBC/D/Plt+RPR+Rh+ABO+RubIgG... - PANORAMA PRENATAL TEST FULL PANEL - Cytology - PAP( [place]) - Hemoglobin A1c - aspirin EC 81 MG tablet; Take 1 tablet (81 mg total) by mouth daily.  Dispense: 60 tablet; Refill: 2 - Comprehensive metabolic panel  2. Language barrier - interpreter, Debarah Crape, present at bedisde  3. History of gestational diabetes - Ha1c collected today, will time GTT accordingly   4. Chronic hypertension affecting pregnancy - currently controlled via amlodipine - will continue to monitor throughout pregnancy  5. History of cervical incompetence -two prior cervical cerclage (G3 and G4 - both carried to 37 weeks).  - Has discussed plan for management with MFM - Current plan is to have a cervical length at 16 weeks and then again at her anatomy ultrasound and set up a cerclage. Patient prefers serial cervical length checks before pursuing cerclage - CL 4cm on previous scan @12  weeks  6. History of VBAC - TOLAC  7. History of eclampsia - when G1  8. AMA (advanced maternal  age) multigravida 35+, first trimester - will need serial growth U/S  - detailed anatomy scheduled  - genetic screening collected  Initial labs drawn. Continue prenatal vitamins. Genetic Screening discussed, First trimester screen, Quad screen, and NIPS: requested. Ultrasound discussed; fetal anatomic survey: ordered. Problem list reviewed and updated. The nature of [facility] with multiple MDs and other Advanced Practice Providers was explained to patient; also emphasized that residents, students are part of our team. Routine obstetric precautions reviewed. Return in about 4 weeks (around 05/02/2023) for [facility], [email or web address]    Corlis Hove, NP    [facility] for [employer], [facility]

## 2023-04-06 LAB — URINE CULTURE, OB REFLEX

## 2023-04-06 LAB — CULTURE, OB URINE

## 2023-04-09 ENCOUNTER — Telehealth: Payer: Self-pay

## 2023-04-09 DIAGNOSIS — R7309 Other abnormal glucose: Secondary | ICD-10-CM

## 2023-04-09 NOTE — Telephone Encounter (Addendum)
-----   Message from Corlis Hove, NP sent at 04/05/2023  6:49 PM EDT ----- Patient's labs are normal except her elevated Ha1c. She needs to be scheduled for an early GTT. Please inform patient of this as well. Thank you! Corlis Hove, [facility], IBCLC  Called pt with [employer], and informed pt results and the provider's recommendation to schedule early 2 hr.   Pt agreed to coming on 04/11/23 @ 0830.    Leonette Nutting  04/09/23

## 2023-04-11 ENCOUNTER — Other Ambulatory Visit: Payer: Self-pay

## 2023-04-11 DIAGNOSIS — R7309 Other abnormal glucose: Secondary | ICD-10-CM

## 2023-04-11 LAB — CYTOLOGY - PAP
Chlamydia: NEGATIVE
Comment: NEGATIVE
Comment: NEGATIVE
Comment: NORMAL
Diagnosis: NEGATIVE
High risk HPV: NEGATIVE
Neisseria Gonorrhea: NEGATIVE

## 2023-04-12 LAB — PANORAMA PRENATAL TEST FULL PANEL:PANORAMA TEST PLUS 5 ADDITIONAL MICRODELETIONS: FETAL FRACTION: 4.7

## 2023-04-12 LAB — GLUCOSE TOLERANCE, 2 HOURS W/ 1HR
Glucose, 1 hour: 143 mg/dL (ref 70–179)
Glucose, 2 hour: 147 mg/dL (ref 70–152)
Glucose, Fasting: 86 mg/dL (ref 70–91)

## 2023-04-29 ENCOUNTER — Ambulatory Visit: Payer: Self-pay | Admitting: *Deleted

## 2023-04-29 ENCOUNTER — Ambulatory Visit: Payer: Self-pay | Attending: Maternal & Fetal Medicine

## 2023-04-29 ENCOUNTER — Encounter: Payer: Self-pay | Admitting: *Deleted

## 2023-04-29 ENCOUNTER — Other Ambulatory Visit: Payer: Self-pay | Admitting: Maternal & Fetal Medicine

## 2023-04-29 VITALS — BP 117/59 | HR 57

## 2023-04-29 DIAGNOSIS — O09521 Supervision of elderly multigravida, first trimester: Secondary | ICD-10-CM

## 2023-04-29 DIAGNOSIS — O10012 Pre-existing essential hypertension complicating pregnancy, second trimester: Secondary | ICD-10-CM

## 2023-04-29 DIAGNOSIS — O09291 Supervision of pregnancy with other poor reproductive or obstetric history, first trimester: Secondary | ICD-10-CM

## 2023-04-29 DIAGNOSIS — Z8632 Personal history of gestational diabetes: Secondary | ICD-10-CM | POA: Insufficient documentation

## 2023-04-29 DIAGNOSIS — O09213 Supervision of pregnancy with history of pre-term labor, third trimester: Secondary | ICD-10-CM

## 2023-04-29 DIAGNOSIS — O10911 Unspecified pre-existing hypertension complicating pregnancy, first trimester: Secondary | ICD-10-CM

## 2023-04-29 DIAGNOSIS — O24415 Gestational diabetes mellitus in pregnancy, controlled by oral hypoglycemic drugs: Secondary | ICD-10-CM

## 2023-04-29 DIAGNOSIS — O0990 Supervision of high risk pregnancy, unspecified, unspecified trimester: Secondary | ICD-10-CM | POA: Insufficient documentation

## 2023-04-29 DIAGNOSIS — O09292 Supervision of pregnancy with other poor reproductive or obstetric history, second trimester: Secondary | ICD-10-CM

## 2023-04-29 DIAGNOSIS — O09522 Supervision of elderly multigravida, second trimester: Secondary | ICD-10-CM

## 2023-04-29 DIAGNOSIS — O3433 Maternal care for cervical incompetence, third trimester: Secondary | ICD-10-CM

## 2023-04-29 DIAGNOSIS — Z3A16 16 weeks gestation of pregnancy: Secondary | ICD-10-CM

## 2023-05-03 ENCOUNTER — Ambulatory Visit (INDEPENDENT_AMBULATORY_CARE_PROVIDER_SITE_OTHER): Payer: Self-pay | Admitting: Obstetrics & Gynecology

## 2023-05-03 ENCOUNTER — Encounter: Payer: Self-pay | Admitting: Obstetrics & Gynecology

## 2023-05-03 ENCOUNTER — Other Ambulatory Visit: Payer: Self-pay

## 2023-05-03 VITALS — BP 111/72 | HR 72 | Wt 202.0 lb

## 2023-05-03 DIAGNOSIS — O0992 Supervision of high risk pregnancy, unspecified, second trimester: Secondary | ICD-10-CM

## 2023-05-03 DIAGNOSIS — Z3A16 16 weeks gestation of pregnancy: Secondary | ICD-10-CM

## 2023-05-03 DIAGNOSIS — Z8742 Personal history of other diseases of the female genital tract: Secondary | ICD-10-CM

## 2023-05-03 DIAGNOSIS — O10919 Unspecified pre-existing hypertension complicating pregnancy, unspecified trimester: Secondary | ICD-10-CM

## 2023-05-03 DIAGNOSIS — O09522 Supervision of elderly multigravida, second trimester: Secondary | ICD-10-CM

## 2023-05-03 DIAGNOSIS — O09529 Supervision of elderly multigravida, unspecified trimester: Secondary | ICD-10-CM

## 2023-05-03 DIAGNOSIS — O10912 Unspecified pre-existing hypertension complicating pregnancy, second trimester: Secondary | ICD-10-CM

## 2023-05-03 DIAGNOSIS — O0990 Supervision of high risk pregnancy, unspecified, unspecified trimester: Secondary | ICD-10-CM

## 2023-05-03 NOTE — Progress Notes (Signed)
PRENATAL VISIT NOTE  Subjective:  Bonnie Morales is a 42 y.o. [id number] at [redacted]w[redacted]d being seen today for ongoing prenatal care. Patient is Spanish-speaking only, interpreter present for this encounter.  She is currently monitored for the following issues for this high-risk pregnancy and has Language barrier; History of cervical incompetence (cerclage in G3 and G4); history of gestational diabetes; Chronic hypertension affecting pregnancy; AMA (advanced maternal age) multigravida 35+; and Supervision of high risk pregnancy, antepartum on their problem list.  Patient reports no complaints.  Contractions: Not present.  .  Movement: Present (very little movement). Denies leaking of fluid.   The following portions of the patient's history were reviewed and updated as appropriate: allergies, current medications, past family history, past medical history, past social history, past surgical history and problem list.   Objective:   Vitals:   05/03/23 0920  BP: 111/72  Pulse: 72  Weight: 202 lb (91.6 kg)    Fetal Status:     Movement: Present (very little movement)     General:  Alert, oriented and cooperative. Patient is in no acute distress.  Skin: Skin is warm and dry. No rash noted.   Cardiovascular: Normal heart rate noted  Respiratory: Normal respiratory effort, no problems with respiration noted  Abdomen: Soft, gravid, appropriate for gestational age.  Pain/Pressure: Absent (intermittent pain in the ovaries)     Pelvic: Cervical exam deferred        Extremities: Normal range of motion.  Edema: None  Mental Status: Normal mood and affect. Normal behavior. Normal judgment and thought content.   [place] MFM OB Transvaginal  Result Date: 04/29/2023 ----------------------------------------------------------------------  OBSTETRICS REPORT                       (Signed Final 04/29/2023 04:35 pm) ---------------------------------------------------------------------- Patient Info  ID #:        [id number]                          D.O.B.:  10-04-1981 (41 yrs)  Name:       Bonnie Morales             Visit Date: 04/29/2023 03:52 pm ---------------------------------------------------------------------- Performed By  Attending:        Noralee Space MD        Referred By:      Corlis Hove  Performed By:     Kris Hartmann,      [facility]                                                             [facility] ---------------------------------------------------------------------- Orders  #  Description                           Code        Ordered By  1  [place] MFM OB TRANSVAGINAL                [phone]     Braxton Feathers  2  [place] MFM OB LIMITED                     [id number]    Braxton Feathers ----------------------------------------------------------------------  #  Order #                     Accession #                Episode #  1  [id number]                   [id number]                 [id number]  2  [id number]                   [id number]                 [id number] ---------------------------------------------------------------------- Indications  [redacted] weeks gestation of pregnancy                Z3A.16  Advanced maternal age multigravida 59+,        O09.522  second trimester  Hypertension - Chronic/Pre-existing            O10.019  Cervical insufficiency, 2nd                    O34.32  Poor obstetric history: Previous gestational   O09.299  diabetes  Obesity complicating pregnancy, second         O99.212  trimester  LR NIPS  Poor obstetric history: Previous               O09.299  preeclampsia / eclampsia/gestational HTN  H/O VBAC ---------------------------------------------------------------------- Fetal Evaluation  Num Of Fetuses:         1  Fetal Heart Rate(bpm):  154  Cardiac Activity:       Observed  Presentation:           Cephalic  Placenta:                Posterior  P. Cord Insertion:      Visualized  Amniotic Fluid  AFI FV:      Within normal limits  AFI Sum(cm)     %Tile       Largest Pocket(cm)  4.67            < 3         4.67  RUQ(cm)  4.67 ---------------------------------------------------------------------- Biometry  BPD:      37.4  mm     G. Age:  17w 3d         96  %    CI:        74.82   %    70 - 86  FL/HC:      15.7   %    13.3 - 16.5  HC:      137.2  mm     G. Age:  17w 1d         90  %    HC/AC:      1.30        1.05 - 1.39  AC:      105.2  mm     G. Age:  16w 3d         68  %    FL/BPD:     57.5   %  FL:       21.5  mm     G. Age:  16w 3d         63  %    FL/AC:      20.4   %    20 - 24  HUM:      22.4  mm     G. Age:  16w 6d         77  %  CER:      16.2  mm     G. Age:  16w 4d         57  %  NFT:       3.9  mm  CM:        3.4  mm  Est. FW:     160  gm      0 lb 6 oz     77  % ---------------------------------------------------------------------- Gestational Age  LMP:           16w 0d        Date:  01/07/23                  EDD:   10/14/23  U/S Today:     16w 6d                                        EDD:   10/08/23  Best:          16w 0d     Det. By:  LMP  (01/07/23)          EDD:   10/14/23 ---------------------------------------------------------------------- Anatomy  Cranium:               Appears normal         Kidneys:                Appear normal  Choroid Plexus:        Appears normal         Upper Extremities:      Appears normal  Stomach:               Appears normal, left   Lower Extremities:      Appears normal                         sided  Other:  Technically difficult due to maternal habitus and fetal position. ---------------------------------------------------------------------- Cervix Uterus Adnexa  Cervix  Length:            3.3  cm.  Normal appearance by transabdominal scan  Right Ovary  Not visualized.  Left Ovary  Not visualized.  Cul De Sac  No free fluid seen.  Adnexa   No abnormality  visualized ---------------------------------------------------------------------- Impression  Patient returned for cervical length measurement.  See  [facility]ailed [facility] consultation at her  previous visit.  She has history of 2 cerclage this in the last 2  pregnancies.  No history of midtrimester pregnancy loss or  preterm delivery.  Patient opted to have serial cervical length measurements.  She opted not to have prophylactic cerclage.  Amniotic fluid is normal and good fetal activity seen.  On  transvaginal ultrasound, the cervix measures 3.3 cm, which is  normal. ---------------------------------------------------------------------- Recommendations  -Patient has an appoinjtment for detailed fetal anatomical  survey and TVS cervical length on 05/20/23 ----------------------------------------------------------------------                 Noralee Space, MD Electronically Signed Final Report   04/29/2023 04:35 pm ----------------------------------------------------------------------  [place] MFM OB LIMITED  Result Date: 04/29/2023 ----------------------------------------------------------------------  OBSTETRICS REPORT                       (Signed Final 04/29/2023 04:35 pm) ---------------------------------------------------------------------- Patient Info  ID #:       [id number]                          D.O.B.:  07-18-1981 (41 yrs)  Name:       IDIL MASLANKA Morales             Visit Date: 04/29/2023 03:52 pm ---------------------------------------------------------------------- Performed By  Attending:        Noralee Space MD        Referred By:      Corlis Hove  Performed By:     Kris Hartmann,      [facility]  ---------------------------------------------------------------------- Orders  #  Description                           Code        Ordered By  1  [place] MFM OB TRANSVAGINAL                [phone]     BURK SCHAIBLE  2  [place] MFM OB LIMITED                     [id number]    BURK SCHAIBLE ----------------------------------------------------------------------  #  Order #  Accession #                Episode #  1  [id number]                   [id number]                 [id number]  2  [id number]                   [id number]                 [id number] ---------------------------------------------------------------------- Indications  [redacted] weeks gestation of pregnancy                Z3A.16  Advanced maternal age multigravida 42+,        O13.522  second trimester  Hypertension - Chronic/Pre-existing            O10.019  Cervical insufficiency, 2nd                    O34.32  Poor obstetric history: Previous gestational   O09.299  diabetes  Obesity complicating pregnancy, second         O99.212  trimester  LR NIPS  Poor obstetric history: Previous               O09.299  preeclampsia / eclampsia/gestational HTN  H/O VBAC ---------------------------------------------------------------------- Fetal Evaluation  Num Of Fetuses:         1  Fetal Heart Rate(bpm):  154  Cardiac Activity:       Observed  Presentation:           Cephalic  Placenta:               Posterior  P. Cord Insertion:      Visualized  Amniotic Fluid  AFI FV:      Within normal limits  AFI Sum(cm)     %Tile       Largest Pocket(cm)  4.67            < 3         4.67  RUQ(cm)  4.67 ---------------------------------------------------------------------- Biometry  BPD:      37.4  mm     G. Age:  17w 3d         96  %    CI:        74.82   %    70 - 86                                                          FL/HC:      15.7   %    13.3 - 16.5  HC:      137.2  mm     G. Age:  17w 1d         90  %    HC/AC:      1.30        1.05 - 1.39  AC:      105.2  mm     G. Age:   16w 3d         68  %    FL/BPD:     57.5   %  FL:       21.5  mm  G. Age:  16w 3d         63  %    FL/AC:      20.4   %    20 - 24  HUM:      22.4  mm     G. Age:  16w 6d         77  %  CER:      16.2  mm     G. Age:  16w 4d         57  %  NFT:       3.9  mm  CM:        3.4  mm  Est. FW:     160  gm      0 lb 6 oz     77  % ---------------------------------------------------------------------- Gestational Age  LMP:           16w 0d        Date:  01/07/23                  EDD:   10/14/23  U/S Today:     16w 6d                                        EDD:   10/08/23  Best:          16w 0d     Det. By:  LMP  (01/07/23)          EDD:   10/14/23 ---------------------------------------------------------------------- Anatomy  Cranium:               Appears normal         Kidneys:                Appear normal  Choroid Plexus:        Appears normal         Upper Extremities:      Appears normal  Stomach:               Appears normal, left   Lower Extremities:      Appears normal                         sided  Other:  Technically difficult due to maternal habitus and fetal position. ---------------------------------------------------------------------- Cervix Uterus Adnexa  Cervix  Length:            3.3  cm.  Normal appearance by transabdominal scan  Right Ovary  Not visualized.  Left Ovary  Not visualized.  Cul De Sac  No free fluid seen.  Adnexa  No abnormality visualized ---------------------------------------------------------------------- Impression  Patient returned for cervical length measurement.  See  [facility]ailed [facility] consultation at her  previous visit.  She has history of 2 cerclage this in the last 2  pregnancies.  No history of midtrimester pregnancy loss or  preterm delivery.  Patient opted to have serial cervical length measurements.  She opted not to have prophylactic cerclage.  Amniotic fluid is normal and good fetal activity seen.  On  transvaginal ultrasound, the cervix measures 3.3  cm, which is  normal. ---------------------------------------------------------------------- Recommendations  -Patient has an appoinjtment for detailed fetal anatomical  survey and TVS cervical length on 05/20/23 ----------------------------------------------------------------------  Noralee Space, MD Electronically Signed Final Report   04/29/2023 04:35 pm ----------------------------------------------------------------------   Assessment and Plan:  Pregnancy: [id number] at [redacted]w[redacted]d 1. History of cervical incompetence (cerclage in G3 and G4) Declined cerclage, continue CL scans as per MFM.  2. Chronic hypertension affecting pregnancy Stable BP on Norvasc, continue ldASA. - Protein / creatinine ratio, urine  3. Antepartum multigravida of advanced maternal age 29. [redacted] weeks gestation of pregnancy 5. Supervision of high risk pregnancy, antepartum Anatomy scan scheduled at MFM on 05/20/23 . Given information about BTL costs as requested. No other complaints or concerns.  Routine obstetric precautions reviewed.  Please refer to After Visit Summary for other counseling recommendations.   Return in about 4 weeks (around 05/31/2023) for OFFICE OB VISIT (MD or APP).  Future Appointments  Date Time Provider Department Center  05/20/2023  2:15 PM [facility] NURSE [facility]  05/20/2023  2:30 PM [facility] US2 [facility]  05/28/2023  3:55 PM Crissie Reese, Mary Sella, MD [facility]    Jaynie Collins, MD

## 2023-05-03 NOTE — Patient Instructions (Signed)
Ligadura de trompas con cesrea o despus del parto vaginal Con cesrea: $1170 Despus del parto vaginal: $1280 ms la anestesia La paciente debe informarle al personal de su deseo de hacerse la ligadura de trompas tan pronto  como le sea posible durante el [place]. Se requiere el pago por adelantado para que el procedimiento pueda programarse. Los planes de pago estn disponibles por solicitud. La paciente debe llamar a [facility] Janann August) al [phone] para pedir la cotizacin.

## 2023-05-04 LAB — PROTEIN / CREATININE RATIO, URINE
Creatinine, Urine: 126.1 mg/dL
Protein, Ur: 12.2 mg/dL
Protein/Creat Ratio: 97 mg/g creat (ref 0–200)

## 2023-05-20 ENCOUNTER — Other Ambulatory Visit: Payer: Self-pay | Admitting: Student

## 2023-05-20 ENCOUNTER — Ambulatory Visit: Payer: Self-pay | Admitting: *Deleted

## 2023-05-20 ENCOUNTER — Ambulatory Visit: Payer: Self-pay | Attending: Student

## 2023-05-20 ENCOUNTER — Other Ambulatory Visit: Payer: Self-pay | Admitting: *Deleted

## 2023-05-20 ENCOUNTER — Encounter: Payer: Self-pay | Admitting: *Deleted

## 2023-05-20 VITALS — BP 123/69 | HR 61

## 2023-05-20 DIAGNOSIS — O0990 Supervision of high risk pregnancy, unspecified, unspecified trimester: Secondary | ICD-10-CM

## 2023-05-20 DIAGNOSIS — O09522 Supervision of elderly multigravida, second trimester: Secondary | ICD-10-CM

## 2023-05-20 DIAGNOSIS — O24415 Gestational diabetes mellitus in pregnancy, controlled by oral hypoglycemic drugs: Secondary | ICD-10-CM

## 2023-05-20 DIAGNOSIS — O10919 Unspecified pre-existing hypertension complicating pregnancy, unspecified trimester: Secondary | ICD-10-CM

## 2023-05-28 ENCOUNTER — Other Ambulatory Visit: Payer: Self-pay

## 2023-05-28 ENCOUNTER — Ambulatory Visit (INDEPENDENT_AMBULATORY_CARE_PROVIDER_SITE_OTHER): Payer: Self-pay | Admitting: Family Medicine

## 2023-05-28 ENCOUNTER — Encounter: Payer: Self-pay | Admitting: Family Medicine

## 2023-05-28 VITALS — BP 123/75 | HR 90 | Wt 205.0 lb

## 2023-05-28 DIAGNOSIS — O10912 Unspecified pre-existing hypertension complicating pregnancy, second trimester: Secondary | ICD-10-CM

## 2023-05-28 DIAGNOSIS — Z603 Acculturation difficulty: Secondary | ICD-10-CM

## 2023-05-28 DIAGNOSIS — Z758 Other problems related to medical facilities and other health care: Secondary | ICD-10-CM

## 2023-05-28 DIAGNOSIS — O24415 Gestational diabetes mellitus in pregnancy, controlled by oral hypoglycemic drugs: Secondary | ICD-10-CM

## 2023-05-28 DIAGNOSIS — O0990 Supervision of high risk pregnancy, unspecified, unspecified trimester: Secondary | ICD-10-CM

## 2023-05-28 DIAGNOSIS — Z8742 Personal history of other diseases of the female genital tract: Secondary | ICD-10-CM

## 2023-05-28 DIAGNOSIS — O10919 Unspecified pre-existing hypertension complicating pregnancy, unspecified trimester: Secondary | ICD-10-CM

## 2023-05-28 DIAGNOSIS — O283 Abnormal ultrasonic finding on antenatal screening of mother: Secondary | ICD-10-CM | POA: Insufficient documentation

## 2023-05-28 DIAGNOSIS — O09522 Supervision of elderly multigravida, second trimester: Secondary | ICD-10-CM

## 2023-05-28 DIAGNOSIS — Z3009 Encounter for other general counseling and advice on contraception: Secondary | ICD-10-CM | POA: Insufficient documentation

## 2023-05-28 DIAGNOSIS — Z3A20 20 weeks gestation of pregnancy: Secondary | ICD-10-CM

## 2023-05-28 DIAGNOSIS — Z98891 History of uterine scar from previous surgery: Secondary | ICD-10-CM

## 2023-05-28 DIAGNOSIS — O0992 Supervision of high risk pregnancy, unspecified, second trimester: Secondary | ICD-10-CM

## 2023-05-28 NOTE — Progress Notes (Signed)
Subjective:  Bonnie Morales is a 42 y.o. [phone] at [redacted]w[redacted]d being seen today for ongoing prenatal care.  She is currently monitored for the following issues for this high-risk pregnancy and has History of cesarean delivery; Language barrier; History of cervical incompetence (cerclage in G3 and G4); history of gestational diabetes; Chronic hypertension affecting pregnancy; AMA (advanced maternal age) multigravida 35+; Supervision of high risk pregnancy, antepartum; Fetal echogenic intracardiac focus on prenatal ultrasound; and Unwanted fertility on their problem list.  Patient reports she is very nervous about the echogenic intracardiac focus seen on anatomy scan and what she was told about it by the doctor.  Contractions: Not present. Vag. Bleeding: None.  Movement: Present. Denies leaking of fluid.   The following portions of the patient's history were reviewed and updated as appropriate: allergies, current medications, past family history, past medical history, past social history, past surgical history and problem list. Problem list updated.  Objective:   Vitals:   05/28/23 1653  BP: 123/75  Pulse: 90  Weight: 205 lb (93 kg)    Fetal Status: Fetal Heart Rate (bpm): 154   Movement: Present     General:  Alert, oriented and cooperative. Patient is in no acute distress.  Skin: Skin is warm and dry. No rash noted.   Cardiovascular: Normal heart rate noted  Respiratory: Normal respiratory effort, no problems with respiration noted  Abdomen: Soft, gravid, appropriate for gestational age. Pain/Pressure: Absent     Pelvic: Vag. Bleeding: None     Cervical exam deferred        Extremities: Normal range of motion.  Edema: None  Mental Status: Normal mood and affect. Normal behavior. Normal judgment and thought content.   Urinalysis:      Assessment and Plan:  Pregnancy: [id number] at [redacted]w[redacted]d  1. Supervision of high risk pregnancy, antepartum BP and FHR normal  2. Chronic hypertension  affecting pregnancy Normotensive on Amlodipine 10 mg Taking ASA  3. history of gestational diabetes New OB A1c abnormal, 2hr GTT normal Repeat at 28 weeks  4. History of cervical incompetence (cerclage in G3 and G4) Declined cerclage in this pregnancy Per most recent anatomy scan/note, CL normal at 3.5 cm, cerclage not needed  5. Language barrier Spanish  6. Multigravida of advanced maternal age in second trimester On ASA Antenatal testing per guidelines  7. Fetal echogenic intracardiac focus on prenatal ultrasound Patient extremely anxious about this finding We discussed that this is a common non specific finding mostly found in babies that do NOT have Down syndrome We also discussed her Panorama result which was low risk and reviewed how this is very reassuring Also reviewed that lack of other markers of down syndrome on ultrasound is reassuring  8. Unwanted fertility Reports she did not intend for this pregnancy or her last one, strongly desires contraception and ideally BTL We discussed that unless she elects for RCS (which she does not want to do) then BTL is not a viable option financially Strongly recommended she and her partner consider vasectomy which we will begin doing soon at [facility] Also advised IUD and that we could get a free device and place it post placentally  9. History of cesarean delivery Strangely not on her problem list, added today CS>VBAC x3 VBAC consent at 28 weeks  Preterm labor symptoms and general obstetric precautions including but not limited to vaginal bleeding, contractions, leaking of fluid and fetal movement were reviewed in detail with the patient. Please refer to After  Visit Summary for other counseling recommendations.  Return in 4 weeks (on 06/25/2023) for [facility], ob visit.   Venora Maples, MD

## 2023-05-28 NOTE — Patient Instructions (Signed)

## 2023-06-18 ENCOUNTER — Ambulatory Visit: Payer: Self-pay | Admitting: *Deleted

## 2023-06-18 ENCOUNTER — Ambulatory Visit: Payer: Self-pay | Attending: Obstetrics

## 2023-06-18 ENCOUNTER — Encounter: Payer: Self-pay | Admitting: *Deleted

## 2023-06-18 VITALS — BP 121/63 | HR 60

## 2023-06-18 DIAGNOSIS — O99212 Obesity complicating pregnancy, second trimester: Secondary | ICD-10-CM

## 2023-06-18 DIAGNOSIS — Z8632 Personal history of gestational diabetes: Secondary | ICD-10-CM

## 2023-06-18 DIAGNOSIS — O10919 Unspecified pre-existing hypertension complicating pregnancy, unspecified trimester: Secondary | ICD-10-CM | POA: Insufficient documentation

## 2023-06-18 DIAGNOSIS — O0990 Supervision of high risk pregnancy, unspecified, unspecified trimester: Secondary | ICD-10-CM

## 2023-06-18 DIAGNOSIS — O09522 Supervision of elderly multigravida, second trimester: Secondary | ICD-10-CM | POA: Insufficient documentation

## 2023-06-18 DIAGNOSIS — O3412 Maternal care for benign tumor of corpus uteri, second trimester: Secondary | ICD-10-CM

## 2023-06-18 DIAGNOSIS — E669 Obesity, unspecified: Secondary | ICD-10-CM

## 2023-06-18 DIAGNOSIS — O10012 Pre-existing essential hypertension complicating pregnancy, second trimester: Secondary | ICD-10-CM

## 2023-06-18 DIAGNOSIS — O24415 Gestational diabetes mellitus in pregnancy, controlled by oral hypoglycemic drugs: Secondary | ICD-10-CM

## 2023-06-18 DIAGNOSIS — Z3A23 23 weeks gestation of pregnancy: Secondary | ICD-10-CM

## 2023-06-18 DIAGNOSIS — O09292 Supervision of pregnancy with other poor reproductive or obstetric history, second trimester: Secondary | ICD-10-CM

## 2023-06-18 DIAGNOSIS — D259 Leiomyoma of uterus, unspecified: Secondary | ICD-10-CM

## 2023-06-18 DIAGNOSIS — O34219 Maternal care for unspecified type scar from previous cesarean delivery: Secondary | ICD-10-CM

## 2023-06-18 DIAGNOSIS — O09212 Supervision of pregnancy with history of pre-term labor, second trimester: Secondary | ICD-10-CM

## 2023-06-19 ENCOUNTER — Other Ambulatory Visit: Payer: Self-pay | Admitting: *Deleted

## 2023-06-19 DIAGNOSIS — O09522 Supervision of elderly multigravida, second trimester: Secondary | ICD-10-CM

## 2023-06-25 ENCOUNTER — Telehealth: Payer: Self-pay | Admitting: Family Medicine

## 2023-06-25 DIAGNOSIS — I10 Essential (primary) hypertension: Secondary | ICD-10-CM

## 2023-06-25 MED ORDER — AMLODIPINE BESYLATE 10 MG PO TABS
10.0000 mg | ORAL_TABLET | Freq: Every day | ORAL | 3 refills | Status: DC
Start: 2023-06-25 — End: 2023-09-27

## 2023-06-25 NOTE — Telephone Encounter (Signed)
Patient is needing a refill on her BP medication

## 2023-06-25 NOTE — Telephone Encounter (Signed)
Note by Crissie Reese MD on 05/28/23 states patient well controlled on Norvasc 10 mg daily. No current prescription. Called pt with interpreter Claudia. Pt states this was prescribed by PCP in 2023 and she has been taking continuously. Has just now run out of refills. Refill ordered and sent to  [facility] MD for cosign.

## 2023-06-26 ENCOUNTER — Encounter: Payer: Self-pay | Admitting: Obstetrics and Gynecology

## 2023-07-11 ENCOUNTER — Other Ambulatory Visit: Payer: Self-pay

## 2023-07-11 ENCOUNTER — Ambulatory Visit (INDEPENDENT_AMBULATORY_CARE_PROVIDER_SITE_OTHER): Payer: Self-pay | Admitting: Obstetrics and Gynecology

## 2023-07-11 VITALS — BP 112/73 | HR 75 | Wt 208.4 lb

## 2023-07-11 DIAGNOSIS — Z603 Acculturation difficulty: Secondary | ICD-10-CM

## 2023-07-11 DIAGNOSIS — O10919 Unspecified pre-existing hypertension complicating pregnancy, unspecified trimester: Secondary | ICD-10-CM

## 2023-07-11 DIAGNOSIS — O09522 Supervision of elderly multigravida, second trimester: Secondary | ICD-10-CM

## 2023-07-11 DIAGNOSIS — O0990 Supervision of high risk pregnancy, unspecified, unspecified trimester: Secondary | ICD-10-CM

## 2023-07-11 DIAGNOSIS — Z98891 History of uterine scar from previous surgery: Secondary | ICD-10-CM

## 2023-07-11 DIAGNOSIS — O24415 Gestational diabetes mellitus in pregnancy, controlled by oral hypoglycemic drugs: Secondary | ICD-10-CM

## 2023-07-11 DIAGNOSIS — Z3A26 26 weeks gestation of pregnancy: Secondary | ICD-10-CM

## 2023-07-11 DIAGNOSIS — Z758 Other problems related to medical facilities and other health care: Secondary | ICD-10-CM

## 2023-07-11 DIAGNOSIS — Z8742 Personal history of other diseases of the female genital tract: Secondary | ICD-10-CM

## 2023-07-11 DIAGNOSIS — Z3009 Encounter for other general counseling and advice on contraception: Secondary | ICD-10-CM

## 2023-07-11 DIAGNOSIS — O283 Abnormal ultrasonic finding on antenatal screening of mother: Secondary | ICD-10-CM

## 2023-07-11 NOTE — Progress Notes (Signed)
PRENATAL VISIT NOTE Spanish interpreter used for encounter  Subjective:  Bonnie Morales is a 42 y.o. [id number] at [redacted]w[redacted]d being seen today for ongoing prenatal care.  She is currently monitored for the following issues for this high-risk pregnancy and has History of cesarean delivery; Language barrier; History of cervical incompetence (cerclage in G3 and G4); history of gestational diabetes; Chronic hypertension affecting pregnancy; AMA (advanced maternal age) multigravida 35+; Supervision of high risk pregnancy, antepartum; Fetal echogenic intracardiac focus on prenatal ultrasound; and Unwanted fertility on their problem list.  Patient reports  pelvic pressure that feels different to last time when she had the cerclage. . Continued concerns about cardio genic focus.  Contractions: Irritability. Vag. Bleeding: None.  Movement: Present. Denies leaking of fluid.   The following portions of the patient's history were reviewed and updated as appropriate: allergies, current medications, past family history, past medical history, past social history, past surgical history and problem list.   Objective:   Vitals:   07/11/23 1329  BP: 112/73  Pulse: 75  Weight: 208 lb 6.4 oz (94.5 kg)    Fetal Status: Fetal Heart Rate (bpm): 131 Fundal Height: 29 cm Movement: Present     General:  Alert, oriented and cooperative. Patient is in no acute distress.  Skin: Skin is warm and dry. No rash noted.   Cardiovascular: Normal heart rate noted  Respiratory: Normal respiratory effort, no problems with respiration noted  Abdomen: Soft, gravid, appropriate for gestational age.  Pain/Pressure: Present     Pelvic: Cervical exam deferred        Extremities: Normal range of motion.  Edema: Trace  Mental Status: Normal mood and affect. Normal behavior. Normal judgment and thought content.   Assessment and Plan:  Pregnancy: Q2V9563 at 101w3d  Bonnie Morales was seen today for routine prenatal visit.  Chronic  hypertension affecting pregnancy  history of gestational diabetes Overview: Early 2  hr GTT normal   History of cervical incompetence (cerclage in G3 and G4) Overview: Had cerclage, removed at 37 week visit Current pregnancy:  Declined cerclage (see MFM note), getting serial CL scans   History of cesarean delivery Overview: CS in [place] in 2005 VBAC x3 since then   Language barrier Overview: Spanish   Multigravida of advanced maternal age in second trimester  Supervision of high risk pregnancy, antepartum Overview:  NURSING  PROVIDER  [facility] Dating by LMP 01/07/2023  [facility] Model Traditional Anatomy U/S   Initiated care at  [employer]  Spanish              LAB RESULTS   Support Person FOB Genetics NIPS:             AFP:                NT/IT (FT only)     Carrier Screen Horizon:  Rhogam    A1C/GTT Early: GTT Passed            Third trimester:  Flu Vaccine     TDaP Vaccine   Blood Type    Covid Vaccine  Antibody      Rubella    Feeding Plan Breast  RPR    Contraception BTL HBsAg    Circumcision If boy , No  HIV    Pediatrician  [facility]  HCVAb    Prenatal Classes  Pap Diagnosis  Date Value Ref Range Status  04/04/2023   Final   - Negative for intraepithelial lesion or malignancy (NILM)    BTLConsent  GC/CT Initial:             36wks:  VBAC  Consent  GBS   For PCN allergy, check sensitivities        DME Rx [ ]  BP cuff [ ]  Weight Scale Waterbirth  [ ]  Class [ ]  Consent [ ]  CNM visit  PHQ9 & GAD7 [  ] new OB [  ] 28 weeks  [  ] 36 weeks Induction  [ ]  Orders Entered [ ] Foley Y/N      Fetal echogenic intracardiac focus on prenatal ultrasound  Unwanted fertility Overview: See note from 7/16 Discussed BTL not viable for financial reasons, considering options   [redacted] weeks gestation of pregnancy  Scheduled for follow up [place] on 08/13/23 - EFW @ [redacted]w[redacted]d 89%, 672gm, AC 65%  2hr OGTT  at 28 weeks TDAP at 28 weeks    Preterm labor symptoms and general obstetric precautions including but not limited to vaginal bleeding, contractions, leaking of fluid and fetal movement were reviewed in detail with the patient. Please refer to After Visit Summary for other counseling recommendations.   Return in about 2 weeks (around 07/25/2023), or HR OB, VBAC consent.  Future Appointments  Date Time Provider Department Center  08/13/2023  2:15 PM [facility] NURSE [facility]  08/13/2023  2:30 PM [facility] US1 [facility]    Wyn Forster, MD FMOB Fellow, Faculty practice [facility], Center for [facility] 07/11/23  1:56 PM

## 2023-07-11 NOTE — Patient Instructions (Signed)
Tercer trimestre de Psychiatrist Third Trimester of Pregnancy  El tercer trimestre de embarazo va desde la semana 28 hasta la semana 40. Esto corresponde a los meses 7 a 9. El tercer trimestre es un perodo en el que el beb en gestacin (feto) crece rpidamente. Hacia el final del noveno mes, el feto mide alrededor de 20pulgadas (45cm) de largo y pesa entre 6 y 10 libras (2.7 y 4.5kg). Cambios en el cuerpo durante el tercer trimestre Durante el tercer trimestre, su cuerpo contina experimentando numerosos cambios. Los cambios varan y generalmente vuelven a la normalidad despus del nacimiento del beb. Cambios fsicos Seguir American Standard Companies. Es de esperar que aumente entre 25 y 35libras (11 y 16kg) Psychiatric nurse final del embarazo si inicia el Psychiatrist con un peso normal. Si tiene bajo [place], es de esperar que aumente entre 28 y 40 libras (13 y18 kg), y si tiene sobrepeso, es de esperar que aumente entre 15 y 25 libras (7 y 11kg). Podrn aparecer las primeras Albertson's caderas, el abdomen y las [place]. Las ConAgra Foods seguirn creciendo y Writer. Un lquido amarillo Charity fundraiser) puede salir de sus pechos. Esta es la primera leche que usted produce para su beb. Tal vez haya cambios en el cabello. Esto cambios pueden incluir su engrosamiento, crecimiento rpido y Allied Waste Industries textura. A algunas personas tambin se les cae el cabello durante o despus del [place], o tienen el cabello seco o fino. El ombligo puede salir hacia afuera. Puede observar que se le Eli Lilly and Company, el rostro o los tobillos. Cambios en la salud Es posible que tenga acidez estomacal. Puede sufrir estreimiento. Puede desarrollar hemorroides. Puede desarrollar venas hinchadas y abultadas (venas varicosas) en las piernas. Puede presentar ms dolor en la pelvis, la espalda o los muslos. Esto se debe al Citigroup de peso y al aumento de las hormonas que relajan las articulaciones. Puede presentar un aumento del hormigueo o  entumecimiento en las manos, brazos y piernas. La piel de su abdomen tambin puede sentirse entumecida. Puede sentir que le falta el aire debido a que se expande el tero. Otros cambios Puede tener necesidad de Geographical information systems officer con ms frecuencia porque el feto baja hacia la pelvis y ejerce presin sobre la vejiga. Puede tener ms problemas para dormir. Esto puede deberse al tamao de su abdomen, una mayor necesidad de orinar y un aumento en el metabolismo de su cuerpo. Puede notar que el feto "baja" o lo siente ms bajo, en el abdomen (aligeramiento). Puede tener un aumento de la secrecin vaginal. Puede notar que tiene dolor alrededor del hueso plvico a medida que el tero se distiende. Siga estas instrucciones en su casa: Medicamentos Siga las instrucciones del mdico en relacin con el uso de medicamentos. Durante el embarazo, hay medicamentos que pueden tomarse y otros que no. No tome ningn medicamento a menos que lo haya autorizado el mdico. Tome vitaminas prenatales que contengan por lo menos (mcg) de cido flico. Comida y bebida Lleve una dieta saludable que incluya frutas y verduras frescas, cereales integrales, buenas fuentes de protenas como carnes [place], huevos o tofu, y productos lcteos descremados. Evite la carne cruda y el [place], la [place] y el queso sin Market researcher. Estos portan grmenes que pueden provocar dao tanto a usted como al beb. Tome 4 o 5 comidas pequeas en lugar de 3 comidas abundantes al da. Es posible que tenga que tomar estas medidas para prevenir o tratar el estreimiento: Product manager suficiente lquido como para Photographer  orina de color amarillo plido. Consumir alimentos ricos en fibra, como frijoles, cereales integrales, y frutas y verduras frescas. Limitar el consumo de alimentos ricos en grasa y azcares procesados, como los alimentos fritos o dulces. Actividad Haga ejercicio solamente como se lo haya indicado el mdico. La mayora de las personas  pueden continuar su actividad fsica habitual durante el [place]. Intente realizar como mnimo de actividad fsica por lo menos 5das a la semana. Deje de hacer ejercicio si experimenta contracciones en el tero. Deje de hacer ejercicio si le aparecen dolor o clicos en la parte baja del vientre o de la espalda. Evite levantar pesos [employer]ands. No haga ejercicio si hace mucho calor o humedad, o si se encuentra a una altitud elevada. Si lo desea, puede seguir teniendo [employer] [place] [employer], salvo que el mdico le indique lo contrario. Alivio del dolor y del Ferrum pausas frecuentes y descanse con las piernas levantadas (elevadas) si tiene calambres en las piernas o dolor en la parte baja de la espalda. Dese baos de asiento con agua tibia para Engineer, materials o las molestias causadas por las hemorroides. Use una crema para las hemorroides si el mdico la autoriza. Use un sujetador que le brinde buen soporte para prevenir las molestias causadas por la sensibilidad en las [place]. Si tiene venas varicosas: Use medias de compresin como se lo haya indicado el mdico. Eleve los pies durante , 3 o 4veces por Futures trader. Limite el consumo de sal en su dieta. Seguridad Hable con su mdico antes de viajar distancias largas. No se d baos de inmersin en agua caliente, baos turcos ni saunas. Use el cinturn de seguridad en todo momento mientras conduce o va en auto. Hable con el mdico si es vctima de [employer] o fsico. Preparacin para el nacimiento Para prepararse para la llegada de su beb: Tome clases prenatales para entender, Education administrator, y hacer preguntas sobre el Alta de parto y [place]. Visite el hospital y recorra el rea de maternidad. Compre un asiento de seguridad [employer]Energy [employer], y asegrese de saber cmo instalarlo en su automvil. Prepare la habitacin o el lugar donde dormir el beb. Asegrese de quitar todas las almohadas y [place] de peluche de  la cuna del beb para evitar la asfixia. Indicaciones generales Evite el contacto con las bandejas sanitarias de los gatos y la tierra que estos animales usan. Estos alimentos contienen bacterias que pueden causar defectos congnitos en el beb. Si tiene Financial controller, pdale a alguien que limpie la caja de arena por usted. No se haga lavados vaginales ni use tampones. No use toallas higinicas perfumadas. No consuma ningn producto que contenga nicotina o tabaco, como cigarrillos, cigarrillos electrnicos y tabaco de Theatre manager. Si necesita ayuda para dejar de consumir estos productos, consulte al mdico. No use ningn remedio a base de hierbas, drogas ilegales o medicamentos que no le hayan sido recetados. Las sustancias qumicas de estos productos pueden daar al beb. No beba alcohol. Le realizarn exmenes prenatales ms frecuentes durante el tercer trimestre. Durante una visita prenatal de rutina, el mdico le har un examen fsico, Civil engineer, contracting pruebas y Heritage manager con usted de su salud general. Cumpla con todas las visitas de seguimiento. Esto es importante. Dnde buscar ms informacin American Pregnancy Association (Asociacin Estadounidense del Embarazo): americanpregnancy.org Celanese Corporation of Obstetricians and Gynecologists (Colegio Estadounidense de Obstetras y [place]): EmploymentAssurance.cz? Office on Pitney Bowes (Oficina para la Salud de la Mujer): MightyReward.co.nz Comunquese con un mdico si tiene: Teacher, English as a foreign language. Clicos leves en  la pelvis, presin en la pelvis o dolor persistente en la zona abdominal o la parte baja de la espalda. Vmitos o diarrea. Secrecin vaginal con mal olor u orina con mal olor. Dolor al Beatrix Shipper. Un dolor de cabeza que no desaparece despus de Science writer. Cambios en la visin o ve manchas delante de los ojos. Solicite ayuda de inmediato si: Rompe la bolsa. Tiene contracciones regulares separadas por menos de . Tiene sangrado o  pequeas prdidas vaginales. Siente un dolor abdominal intenso. Tiene dificultad para respirar. Siente dolor en el pecho. Sufre episodios de Baxter [employer]. No ha sentido a su beb moverse durante el perodo de [employer] Energy indic el mdico. Tiene dolor, hinchazn o enrojecimiento nuevos en un brazo o una pierna o se produce un aumento de alguno de estos sntomas. Resumen El tercer trimestre del Psychiatrist comprende desde la semana28 hasta la semana 40 (desde el mes7 hasta el mes9). Puede tener ms problemas para dormir. Esto puede deberse al tamao de su abdomen, una mayor necesidad de orinar y un aumento en el metabolismo de su cuerpo. Le realizarn exmenes prenatales ms frecuentes durante el tercer trimestre. Cumpla con todas las visitas de seguimiento. Esto es importante. Esta informacin no tiene Theme park manager el consejo del mdico. Asegrese de hacerle al mdico cualquier pregunta que tenga. Document Revised: 05/06/2020 Document Reviewed: 05/06/2020 Elsevier Patient Education  2024 Elsevier Inc.  Parto prematuro Preterm Labor La duracin normal de un embarazo es de 39 a 41semanas. Se llama trabajo de parto prematuro cuando este se inicia antes de las 37semanas de [place]. Los bebs que nacen de forma prematura y sobreviven pueden no estar completamente desarrollados y correr un mayor riesgo de tener problemas a largo plazo, como parlisis cerebral, retrasos en el desarrollo y problemas de la vista y el odo. Los bebs que nacen antes de tiempo pueden tener problemas poco despus del nacimiento. Los bebs prematuros pueden estar relacionados con la regulacin del azcar en la sangre, la temperatura corporal, la frecuencia cardaca y la frecuencia respiratoria. Estos bebs suelen tener problemas para alimentarse. El riesgo de tener problemas es mayor para los bebs que nacen antes de las 34semanas de Psychiatrist. Cules son las causas? Se desconoce la causa exacta de esta  afeccin. Qu incrementa el riesgo? Es ms probable que tenga un parto prematuro si tiene ciertos factores de riesgo que guardan relacin con sus antecedentes mdicos, problemas en el embarazo en curso y en los [place], y factores relacionados con el estilo de vida. Antecedentes mdicos Tiene anormalidades en el tero, por ejemplo, el cuello uterino corto. Tiene ITS (infecciones de transmisin sexual) u otras infecciones en las vas urinarias y la vagina. Tiene enfermedades crnicas, como problemas de coagulacin sangunea, diabetes o hipertensin arterial. Tiene sobrepeso o bajo peso. Embarazo en curso y Insurance underwriter anteriores Ha tenido trabajo de parto prematuro anteriormente. Tiene un embarazo gemelar o mltiple. Le han diagnosticado una afeccin en la que la placenta cubre el cuello uterino (placenta previa). Esper menos de 18 meses entre un parto y un Psychologist, occupational. El beb en gestacin tiene algunas anormalidades. Tiene sangrado vaginal durante el embarazo. Qued embarazada a travs de la fertilizacin in vitro (FIV). Factores relacionados con el ambiente y el estilo de vida Consume productos que contienen tabaco o toma bebidas alcohlicas. Consume drogas. Tiene estrs y no cuenta con apoyo social. Sufre violencia domstica. Est expuesta a ciertas sustancias qumicas o contaminantes ambientales. Otros factores Es Adult nurse de 17aos o mayor de 35aos de edad. Cules  son los signos o sntomas? Los sntomas de esta afeccin incluyen: Educational psychologist similares a los que ocurren durante el perodo menstrual. Los calambres pueden presentarse con diarrea. Dolor en el abdomen o en la parte inferior de la espalda. Contracciones regulares que se pueden sentir como un endurecimiento del abdomen. Una sensacin de mayor presin en la pelvis. Aumento de la secrecin de mucosidad acuosa o sanguinolenta de la vagina. Rotura de bolsa (rotura de saco amnitico). Cmo se diagnostica? Esta  afeccin se diagnostica en funcin de lo siguiente: Los antecedentes mdicos y un examen fsico. Un examen plvico. Sherlyn Lees. Monitorear el tero para Tree surgeon. Otros estudios, incluidos los siguientes: Un hisopado del cuello uterino para Engineer, manufacturing una sustancia qumica llamada fibronectina fetal. Anlisis de [place]. Cmo se trata? El tratamiento de [address] afeccin depende del tiempo de su [place], su estado y la salud de su beb. El tratamiento puede incluir: Tomar medicamentos como, por ejemplo: Medicamentos hormonales. Estos se pueden administrar de forma temprana en el embarazo para ayudar a Visual merchandiser. Medicamentos para TEFL teacher las contracciones. Medicamentos que ayudan a [employer] del beb. Estos se pueden recetar si el riesgo de parto es [place]. Medicamentos para ayudar a proteger a su beb de complicaciones cerebrales y nerviosas, como la parlisis cerebral. Reposo en cama. Si el trabajo de parto se inicia antes de las 34semanas de [place], es posible que deba hospitalizarse. Llevar a cabo el parto. Siga estas instrucciones en su casa:  No consuma ningn producto que contenga nicotina o tabaco. Estos productos incluyen cigarrillos, tabaco para mascar y aparatos de vapeo, como los Administrator, Civil Service. Si necesita ayuda para dejar de fumar, consulte al mdico. No beba alcohol. Use los medicamentos de venta libre y los recetados solamente como se lo haya indicado el mdico. Haga reposo como se lo haya indicado el mdico. Retome sus actividades normales segn lo indicado por el mdico. Pregntele al mdico qu actividades son seguras para usted. Concurra a todas las visitas de seguimiento. Esto es importante. Cmo se previene? Para aumentar las probabilidades de tener un embarazo a trmino, Financial planner en cuenta lo siguiente: No consuma drogas ni use medicamentos que no le hayan recetado Academic librarian. Hable con el mdico antes de tomar  suplementos a base de hierbas aunque los [employer]. Asegrese de llegar a un peso Office manager. Tenga cuidado con las infecciones. Si cree que puede tener una infeccin, consulte al mdico para que la revisen. Los sntomas de infeccin pueden incluir los siguientes: [place]. Secrecin vaginal anormal o con mal olor. Ardor o dolor al [employer]ocoPhillips. Necesidad urgente de Geographical information systems officer. Miccin frecuente u orinar pequeas cantidades con frecuencia. Sangre en la orina u orina que huele mal o de manera inusual. Dnde buscar ms informacin U.S. Department of Health and [employer], Office on Pitney Bowes (Oficina para la Salud de la Mujer del Departamento de Salud y Servicios Humanos de los EE.UU.): http://hoffman.com/ Celanese Corporation of Obstetricians and Gynecologists (Colegio Estadounidense de Obstetras y Scientific laboratory technician): www.acog.org Centers for Disease Control and Prevention, Preterm Birth (Centros para Air traffic controller y Engineer, agricultural Prevencin de [place], [place] prematuro): FootballExhibition.com.br Comunquese con un mdico si: Piensa que est teniendo un trabajo de parto prematuro. Tiene signos o sntomas de trabajo de Coca[employer]. Tiene sntomas de infeccin. Solicite ayuda de inmediato si: Tiene contracciones dolorosas y regulares cada 5 minutos o menos. Rompe la bolsa. Resumen Se llama trabajo de parto prematuro cuando se inicia antes de las 37 semanas de [place]. El  parto prematuro aumenta el riesgo del beb de desarrollar problemas a Air cabin crew. Es ms probable que tenga un parto prematuro si tiene ciertos factores de riesgo que guardan relacin con sus antecedentes mdicos, problemas en el embarazo en curso y en los [place], y factores relacionados con el estilo de vida. Concurra a todas las visitas de seguimiento. Esto es importante. Comunquese con un mdico si tiene signos o sntomas de trabajo de [employer]a. Esta informacin no tiene Theme park manager el  consejo del mdico. Asegrese de hacerle al mdico cualquier pregunta que tenga. Document Revised: 11/22/2020 Document Reviewed: 11/22/2020 Elsevier Patient Education  2024 [employer]vin[employer]itor.

## 2023-07-22 ENCOUNTER — Ambulatory Visit (INDEPENDENT_AMBULATORY_CARE_PROVIDER_SITE_OTHER): Payer: Self-pay | Admitting: Obstetrics & Gynecology

## 2023-07-22 ENCOUNTER — Other Ambulatory Visit: Payer: Self-pay

## 2023-07-22 VITALS — BP 122/64 | HR 66 | Wt 209.0 lb

## 2023-07-22 DIAGNOSIS — Z98891 History of uterine scar from previous surgery: Secondary | ICD-10-CM

## 2023-07-22 DIAGNOSIS — Z603 Acculturation difficulty: Secondary | ICD-10-CM

## 2023-07-22 DIAGNOSIS — Z3A28 28 weeks gestation of pregnancy: Secondary | ICD-10-CM

## 2023-07-22 DIAGNOSIS — O0990 Supervision of high risk pregnancy, unspecified, unspecified trimester: Secondary | ICD-10-CM

## 2023-07-22 DIAGNOSIS — Z758 Other problems related to medical facilities and other health care: Secondary | ICD-10-CM

## 2023-07-22 DIAGNOSIS — O0993 Supervision of high risk pregnancy, unspecified, third trimester: Secondary | ICD-10-CM

## 2023-07-22 DIAGNOSIS — Z23 Encounter for immunization: Secondary | ICD-10-CM

## 2023-07-22 DIAGNOSIS — O10919 Unspecified pre-existing hypertension complicating pregnancy, unspecified trimester: Secondary | ICD-10-CM

## 2023-07-22 NOTE — Patient Instructions (Addendum)
Influenza (Flu) Vaccine (Inactivated or Recombinant): What You Need to Know Many vaccine information statements are available in Spanish and other languages. See PromoAge.com.br. 1. Why get vaccinated? Influenza vaccine can prevent influenza (flu). Flu is a contagious disease that spreads around the [place] every year, usually between October and May. Anyone can get the flu, but it is more dangerous for some people. Infants and young children, people 54 years and older, pregnant people, and people with certain health conditions or a weakened immune system are at greatest risk of flu complications. Pneumonia, bronchitis, sinus infections, and ear infections are examples of flu-related complications. If you have a medical condition, such as heart disease, cancer, or diabetes, flu can make it worse. Flu can cause fever and chills, sore throat, muscle aches, fatigue, cough, headache, and runny or stuffy nose. Some people may have vomiting and diarrhea, though this is more common in children than adults. In an average year, thousands of people in the [place] die from flu, and many more are hospitalized. Flu vaccine prevents millions of illnesses and flu-related visits to the doctor each year. 2. Influenza vaccines CDC recommends everyone 6 months and older get vaccinated every flu season. Children 6 months through 7 years of age may need 2 doses during a single flu season. Everyone else needs only 1 dose each flu season. It takes about 2 weeks for protection to develop after vaccination. There are many flu viruses, and they are always changing. Each year a new flu vaccine is made to protect against the influenza viruses believed to be likely to cause disease in the upcoming flu season. Even when the vaccine doesn't exactly match these viruses, it may still provide some protection. Influenza vaccine does not cause flu. Influenza vaccine may be given at the same time as other  vaccines. 3. Talk with your health care provider Tell your vaccination provider if the person getting the vaccine: Has had an allergic reaction after a previous dose of influenza vaccine, or has any severe, life-threatening allergies Has ever had Guillain-Barr Syndrome (also called "GBS") In some cases, your health care provider may decide to postpone influenza vaccination until a future visit. Influenza vaccine can be administered at any time during pregnancy. People who are or will be pregnant during influenza season should receive inactivated influenza vaccine. People with minor illnesses, such as a cold, may be vaccinated. People who are moderately or severely ill should usually wait until they recover before getting influenza vaccine. Your health care provider can give you more information. 4. Risks of a vaccine reaction Soreness, redness, and swelling where the shot is given, fever, muscle aches, and headache can happen after influenza vaccination. There may be a very small increased risk of Guillain-Barr Syndrome (GBS) after inactivated influenza vaccine (the flu shot). Young children who get the flu shot along with pneumococcal vaccine (PCV13) and/or DTaP vaccine at the same time might be slightly more likely to have a seizure caused by fever. Tell your health care provider if a child who is getting flu vaccine has ever had a seizure. People sometimes faint after medical procedures, including vaccination. Tell your provider if you feel dizzy or have vision changes or ringing in the ears. As with any medicine, there is a very remote chance of a vaccine causing a severe allergic reaction, other serious injury, or death. 5. What if there is a serious problem? An allergic reaction could occur after the vaccinated person leaves the clinic. If you see  signs of a severe allergic reaction (hives, swelling of the face and throat, difficulty breathing, a fast heartbeat, dizziness, or weakness), call  9-1-1 and get the person to the nearest hospital. For other signs that concern you, call your health care provider. Adverse reactions should be reported to the Vaccine Adverse Event Reporting System (VAERS). Your health care provider will usually file this report, or you can do it yourself. Visit the VAERS website at www.vaers.LAgents.no or call 585-800-1430. VAERS is only for reporting reactions, and VAERS staff members do not give medical advice. 6. The National Vaccine Injury Compensation Program The [employer] Vaccine Injury Compensation Program (VICP) is a federal program that was created to compensate people who may have been injured by certain vaccines. Claims regarding alleged injury or death due to vaccination have a time limit for filing, which may be as short as two years. Visit the VICP website at SpiritualWord.at or call (816) 594-4605 to learn about the program and about filing a claim. 7. How can I learn more? Ask your health care provider. Call your local or state health department. Visit the website of the Food and Drug Administration (FDA) for vaccine package inserts and additional information at FinderList.no. Contact the Centers for Disease Control and Prevention (CDC): Call (212)501-2588 (1-800-CDC-INFO) or Visit CDC's website at BiotechRoom.com.cy. Source: CDC Vaccine Information Statement Inactivated Influenza Vaccine (06/17/2020) This same material is available at FootballExhibition.com.br for no charge. This information is not intended to replace advice given to you by your health care provider. Make sure you discuss any questions you have with your health care provider. Document Revised: 02/13/2023 Document Reviewed: 11/19/2022 Elsevier Patient Education  2024 [employer].   Tdap (Tetanus, Diphtheria, Pertussis) Vaccine: What You Need to Know Many vaccine information statements are available in Spanish and other languages. See  PromoAge.com.br. 1. Why get vaccinated? Tdap vaccine can prevent tetanus, diphtheria, and pertussis. Diphtheria and pertussis spread from person to person. Tetanus enters the body through cuts or wounds. TETANUS (T) causes painful stiffening of the muscles. Tetanus can lead to serious health problems, including being unable to open the mouth, having trouble swallowing and breathing, or death. DIPHTHERIA (D) can lead to difficulty breathing, heart failure, paralysis, or death. PERTUSSIS (aP), also known as "whooping cough," can cause uncontrollable, violent coughing that makes it hard to breathe, eat, or drink. Pertussis can be extremely serious especially in babies and young children, causing pneumonia, convulsions, brain damage, or death. In teens and adults, it can cause weight loss, loss of bladder control, passing out, and rib fractures from severe coughing. 2. Tdap vaccine Tdap is only for children 7 years and older, adolescents, and adults.  Adolescents should receive a single dose of Tdap, preferably at age 13 or 12 years. Pregnant people should get a dose of Tdap during every pregnancy, preferably during the early part of the third trimester, to help protect the newborn from pertussis. Infants are most at risk for severe, life-threatening complications from pertussis. Adults who have never received Tdap should get a dose of Tdap. Also, adults should receive a booster dose of either Tdap or Td (a different vaccine that protects against tetanus and diphtheria but not pertussis) every 10 years, or after 5 years in the case of a severe or dirty wound or burn. Tdap may be given at the same time as other vaccines. 3. Talk with your health care provider Tell your vaccine provider if the person getting the vaccine: Has had an allergic reaction after a previous dose  of any vaccine that protects against tetanus, diphtheria, or pertussis, or has any severe, life-threatening allergies Has had a  coma, decreased level of consciousness, or prolonged seizures within 7 days after a previous dose of any pertussis vaccine (DTP, DTaP, or Tdap) Has seizures or another nervous system problem Has ever had Guillain-Barr Syndrome (also called "GBS") Has had severe pain or swelling after a previous dose of any vaccine that protects against tetanus or diphtheria In some cases, your health care provider may decide to postpone Tdap vaccination until a future visit. People with minor illnesses, such as a cold, may be vaccinated. People who are moderately or severely ill should usually wait until they recover before getting Tdap vaccine.  Your health care provider can give you more information. 4. Risks of a vaccine reaction Pain, redness, or swelling where the shot was given, mild fever, headache, feeling tired, and nausea, vomiting, diarrhea, or stomachache sometimes happen after Tdap vaccination. People sometimes faint after medical procedures, including vaccination. Tell your provider if you feel dizzy or have vision changes or ringing in the ears.  As with any medicine, there is a very remote chance of a vaccine causing a severe allergic reaction, other serious injury, or death. 5. What if there is a serious problem? An allergic reaction could occur after the vaccinated person leaves the clinic. If you see signs of a severe allergic reaction (hives, swelling of the face and throat, difficulty breathing, a fast heartbeat, dizziness, or weakness), call 9-1-1 and get the person to the nearest hospital. For other signs that concern you, call your health care provider.  Adverse reactions should be reported to the Vaccine Adverse Event Reporting System (VAERS). Your health care provider will usually file this report, or you can do it yourself. Visit the VAERS website at www.vaers.LAgents.no or call 914-626-7256. VAERS is only for reporting reactions, and VAERS staff members do not give medical advice. 6. The  National Vaccine Injury Compensation Program The [employer] Vaccine Injury Compensation Program (VICP) is a federal program that was created to compensate people who may have been injured by certain vaccines. Claims regarding alleged injury or death due to vaccination have a time limit for filing, which may be as short as two years. Visit the VICP website at SpiritualWord.at or call 763-380-2062 to learn about the program and about filing a claim. 7. How can I learn more? Ask your health care provider. Call your local or state health department. Visit the website of the Food and Drug Administration (FDA) for vaccine package inserts and additional information at FinderList.no. Contact the Centers for Disease Control and Prevention (CDC): Call 7016194246 (1-800-CDC-INFO) or Visit CDC's website at PicCapture.uy. Source: CDC Vaccine Information Statement Tdap (Tetanus, Diphtheria, Pertussis) Vaccine (06/17/2020) This same material is available at FootballExhibition.com.br for no charge. This information is not intended to replace advice given to you by your health care provider. Make sure you discuss any questions you have with your health care provider. Document Revised: 02/13/2023 Document Reviewed: 12/14/2022 Elsevier Patient Education  2024 [employer].

## 2023-07-22 NOTE — Progress Notes (Unsigned)
   PRENATAL VISIT NOTE  Subjective:  Bonnie Morales is a 42 y.o. [id number] at [redacted]w[redacted]d being seen today for ongoing prenatal care.  She is currently monitored for the following issues for this high-risk pregnancy and has History of cesarean delivery; Language barrier; History of cervical incompetence (cerclage in G3 and G4); history of gestational diabetes; Chronic hypertension affecting pregnancy; AMA (advanced maternal age) multigravida 35+; Supervision of high risk pregnancy, antepartum; Fetal echogenic intracardiac focus on prenatal ultrasound; and Unwanted fertility on their problem list.  Patient reports no complaints.  Contractions: Irritability. Vag. Bleeding: None.  Movement: Present. Denies leaking of fluid.   The following portions of the patient's history were reviewed and updated as appropriate: allergies, current medications, past family history, past medical history, past social history, past surgical history and problem list.   Objective:   Vitals:   07/22/23 1129  BP: 122/64  Pulse: 66  Weight: 209 lb (94.8 kg)    Fetal Status: Fetal Heart Rate (bpm): 150   Movement: Present     General:  Alert, oriented and cooperative. Patient is in no acute distress.  Skin: Skin is warm and dry. No rash noted.   Cardiovascular: Normal heart rate noted  Respiratory: Normal respiratory effort, no problems with respiration noted  Abdomen: Soft, gravid, appropriate for gestational age.  Pain/Pressure: Present     Pelvic:         Extremities: Normal range of motion.  Edema: Trace  Mental Status: Normal mood and affect. Normal behavior. Normal judgment and thought content.   Assessment and Plan:  Pregnancy: [id number] at [redacted]w[redacted]d 1. Supervision of high risk pregnancy, antepartum  - Flu vaccine trivalent PF, 6mos and older(Flulaval,Afluria,Fluarix,Fluzone) - Tdap vaccine greater than or equal to 7yo IM  2. History of cesarean delivery Plans TOLAC  3. Language barrier Spanish  Preterm  labor symptoms and general obstetric precautions including but not limited to vaginal bleeding, contractions, leaking of fluid and fetal movement were reviewed in detail with the patient. Please refer to After Visit Summary for other counseling recommendations.   Return in about 2 weeks (around 08/05/2023).  Future Appointments  Date Time Provider Department Center  08/08/2023 10:55 AM Hermina Staggers, MD [facility]  08/13/2023  2:15 PM [facility] NURSE [facility]  08/13/2023  2:30 PM [facility] US1 [facility]    Scheryl Darter, MD

## 2023-07-23 LAB — GLUCOSE TOLERANCE, 2 HOURS W/ 1HR
Glucose, 1 hour: 164 mg/dL (ref 70–179)
Glucose, 2 hour: 132 mg/dL (ref 70–152)
Glucose, Fasting: 86 mg/dL (ref 70–91)

## 2023-07-23 LAB — CBC
Hematocrit: 32.6 % — ABNORMAL LOW (ref 34.0–46.6)
Hemoglobin: 10.7 g/dL — ABNORMAL LOW (ref 11.1–15.9)
MCH: 28.8 pg (ref 26.6–33.0)
MCHC: 32.8 g/dL (ref 31.5–35.7)
MCV: 88 fL (ref 79–97)
Platelets: 255 10*3/uL (ref 150–450)
RBC: 3.72 x10E6/uL — ABNORMAL LOW (ref 3.77–5.28)
RDW: 13.5 % (ref 11.7–15.4)
WBC: 6.7 10*3/uL (ref 3.4–10.8)

## 2023-07-23 LAB — HIV ANTIBODY (ROUTINE TESTING W REFLEX): HIV Screen 4th Generation wRfx: NONREACTIVE

## 2023-07-23 LAB — RPR: RPR Ser Ql: NONREACTIVE

## 2023-08-08 ENCOUNTER — Encounter: Payer: Self-pay | Admitting: Obstetrics and Gynecology

## 2023-08-08 ENCOUNTER — Other Ambulatory Visit: Payer: Self-pay

## 2023-08-08 ENCOUNTER — Ambulatory Visit (INDEPENDENT_AMBULATORY_CARE_PROVIDER_SITE_OTHER): Payer: Self-pay | Admitting: Obstetrics and Gynecology

## 2023-08-08 VITALS — BP 146/60 | HR 70 | Wt 212.6 lb

## 2023-08-08 DIAGNOSIS — Z758 Other problems related to medical facilities and other health care: Secondary | ICD-10-CM

## 2023-08-08 DIAGNOSIS — O09523 Supervision of elderly multigravida, third trimester: Secondary | ICD-10-CM

## 2023-08-08 DIAGNOSIS — O283 Abnormal ultrasonic finding on antenatal screening of mother: Secondary | ICD-10-CM

## 2023-08-08 DIAGNOSIS — Z98891 History of uterine scar from previous surgery: Secondary | ICD-10-CM

## 2023-08-08 DIAGNOSIS — Z603 Acculturation difficulty: Secondary | ICD-10-CM

## 2023-08-08 DIAGNOSIS — O0990 Supervision of high risk pregnancy, unspecified, unspecified trimester: Secondary | ICD-10-CM

## 2023-08-08 DIAGNOSIS — O10919 Unspecified pre-existing hypertension complicating pregnancy, unspecified trimester: Secondary | ICD-10-CM

## 2023-08-08 DIAGNOSIS — O34219 Maternal care for unspecified type scar from previous cesarean delivery: Secondary | ICD-10-CM | POA: Insufficient documentation

## 2023-08-08 NOTE — Progress Notes (Signed)
Pt reports a lot of abdominal cramps & lower back pain

## 2023-08-08 NOTE — Progress Notes (Signed)
Subjective:  Bonnie Morales is a 42 y.o. [phone] at [redacted]w[redacted]d being seen today for ongoing prenatal care.  She is currently monitored for the following issues for this high-risk pregnancy and has History of cesarean delivery; Language barrier; History of cervical incompetence (cerclage in G3 and G4); history of gestational diabetes; Chronic hypertension affecting pregnancy; AMA (advanced maternal age) multigravida 35+; Supervision of high risk pregnancy, antepartum; Fetal echogenic intracardiac focus on prenatal ultrasound; Unwanted fertility; and VBAC (vaginal birth after Cesarean) on their problem list.  Patient reports  general discomforts of pregnancy .  Contractions: Irritability. Vag. Bleeding: None.  Movement: Present. Denies leaking of fluid.   The following portions of the patient's history were reviewed and updated as appropriate: allergies, current medications, past family history, past medical history, past social history, past surgical history and problem list. Problem list updated.  Objective:   Vitals:   08/08/23 1109  BP: (!) 146/60  Pulse: 70  Weight: 212 lb 9.6 oz (96.4 kg)    Fetal Status: Fetal Heart Rate (bpm): 143   Movement: Present     General:  Alert, oriented and cooperative. Patient is in no acute distress.  Skin: Skin is warm and dry. No rash noted.   Cardiovascular: Normal heart rate noted  Respiratory: Normal respiratory effort, no problems with respiration noted  Abdomen: Soft, gravid, appropriate for gestational age. Pain/Pressure: Present     Pelvic:  Cervical exam deferred        Extremities: Normal range of motion.  Edema: None  Mental Status: Normal mood and affect. Normal behavior. Normal judgment and thought content.   Urinalysis:      Assessment and Plan:  Pregnancy: [id number] at [redacted]w[redacted]d  1. Supervision of high risk pregnancy, antepartum Stable  2. Chronic hypertension affecting pregnancy BP stable Continue with current Tx Growth scan next  week Antenatal testing to start at 32 weeks  3. Multigravida of advanced maternal age in third trimester Genetic testing normal  4. Fetal echogenic intracardiac focus on prenatal ultrasound Normal genetic testing Serial growth scans as per MFM  5. Language barrier Live interupter used during today's visit  6. History of cesarean delivery VABC x 2   7. VBAC (vaginal birth after Cesarean) VBAC consent today  Preterm labor symptoms and general obstetric precautions including but not limited to vaginal bleeding, contractions, leaking of fluid and fetal movement were reviewed in detail with the patient. Please refer to After Visit Summary for other counseling recommendations.  Return in about 2 weeks (around 08/22/2023) for OB visit, face to face, MD only.   Hermina Staggers, MD

## 2023-08-13 ENCOUNTER — Ambulatory Visit: Payer: Self-pay

## 2023-08-13 ENCOUNTER — Ambulatory Visit: Payer: Self-pay | Attending: Maternal & Fetal Medicine

## 2023-08-21 ENCOUNTER — Ambulatory Visit: Payer: Self-pay | Admitting: *Deleted

## 2023-08-21 ENCOUNTER — Other Ambulatory Visit: Payer: Self-pay | Admitting: Maternal & Fetal Medicine

## 2023-08-21 ENCOUNTER — Ambulatory Visit: Payer: Self-pay | Attending: Maternal & Fetal Medicine

## 2023-08-21 ENCOUNTER — Other Ambulatory Visit: Payer: Self-pay | Admitting: *Deleted

## 2023-08-21 VITALS — BP 135/69 | HR 69

## 2023-08-21 DIAGNOSIS — O10913 Unspecified pre-existing hypertension complicating pregnancy, third trimester: Secondary | ICD-10-CM

## 2023-08-21 DIAGNOSIS — D259 Leiomyoma of uterus, unspecified: Secondary | ICD-10-CM

## 2023-08-21 DIAGNOSIS — O09523 Supervision of elderly multigravida, third trimester: Secondary | ICD-10-CM

## 2023-08-21 DIAGNOSIS — Z3A32 32 weeks gestation of pregnancy: Secondary | ICD-10-CM

## 2023-08-21 DIAGNOSIS — O09522 Supervision of elderly multigravida, second trimester: Secondary | ICD-10-CM

## 2023-08-21 DIAGNOSIS — E669 Obesity, unspecified: Secondary | ICD-10-CM

## 2023-08-21 DIAGNOSIS — O0990 Supervision of high risk pregnancy, unspecified, unspecified trimester: Secondary | ICD-10-CM

## 2023-08-21 DIAGNOSIS — O99213 Obesity complicating pregnancy, third trimester: Secondary | ICD-10-CM

## 2023-08-21 DIAGNOSIS — O34219 Maternal care for unspecified type scar from previous cesarean delivery: Secondary | ICD-10-CM

## 2023-08-21 DIAGNOSIS — O24415 Gestational diabetes mellitus in pregnancy, controlled by oral hypoglycemic drugs: Secondary | ICD-10-CM

## 2023-08-21 DIAGNOSIS — O09293 Supervision of pregnancy with other poor reproductive or obstetric history, third trimester: Secondary | ICD-10-CM

## 2023-08-21 DIAGNOSIS — O09213 Supervision of pregnancy with history of pre-term labor, third trimester: Secondary | ICD-10-CM

## 2023-08-21 DIAGNOSIS — O10013 Pre-existing essential hypertension complicating pregnancy, third trimester: Secondary | ICD-10-CM

## 2023-08-21 DIAGNOSIS — O3412 Maternal care for benign tumor of corpus uteri, second trimester: Secondary | ICD-10-CM

## 2023-08-26 ENCOUNTER — Other Ambulatory Visit: Payer: Self-pay

## 2023-08-26 ENCOUNTER — Ambulatory Visit (INDEPENDENT_AMBULATORY_CARE_PROVIDER_SITE_OTHER): Payer: Self-pay | Admitting: Obstetrics and Gynecology

## 2023-08-26 VITALS — BP 139/70 | HR 64 | Wt 216.0 lb

## 2023-08-26 DIAGNOSIS — O34219 Maternal care for unspecified type scar from previous cesarean delivery: Secondary | ICD-10-CM

## 2023-08-26 DIAGNOSIS — O09523 Supervision of elderly multigravida, third trimester: Secondary | ICD-10-CM

## 2023-08-26 DIAGNOSIS — O0990 Supervision of high risk pregnancy, unspecified, unspecified trimester: Secondary | ICD-10-CM

## 2023-08-26 DIAGNOSIS — O10919 Unspecified pre-existing hypertension complicating pregnancy, unspecified trimester: Secondary | ICD-10-CM

## 2023-08-26 DIAGNOSIS — O10913 Unspecified pre-existing hypertension complicating pregnancy, third trimester: Secondary | ICD-10-CM

## 2023-08-26 DIAGNOSIS — Z603 Acculturation difficulty: Secondary | ICD-10-CM

## 2023-08-26 DIAGNOSIS — Z758 Other problems related to medical facilities and other health care: Secondary | ICD-10-CM

## 2023-08-26 DIAGNOSIS — Z98891 History of uterine scar from previous surgery: Secondary | ICD-10-CM

## 2023-08-26 DIAGNOSIS — Z3A33 33 weeks gestation of pregnancy: Secondary | ICD-10-CM

## 2023-08-26 DIAGNOSIS — O283 Abnormal ultrasonic finding on antenatal screening of mother: Secondary | ICD-10-CM

## 2023-08-26 DIAGNOSIS — O0993 Supervision of high risk pregnancy, unspecified, third trimester: Secondary | ICD-10-CM

## 2023-08-26 NOTE — Progress Notes (Signed)
   PRENATAL VISIT NOTE  Subjective:  Bonnie Morales is a 42 y.o. [id number] at [redacted]w[redacted]d being seen today for ongoing prenatal care.  She is currently monitored for the following issues for this high-risk pregnancy and has History of cesarean delivery; Language barrier; History of cervical incompetence (cerclage in G3 and G4); history of gestational diabetes; Chronic hypertension affecting pregnancy; AMA (advanced maternal age) multigravida 35+; Supervision of high risk pregnancy, antepartum; Fetal echogenic intracardiac focus on prenatal ultrasound; Unwanted fertility; and VBAC (vaginal birth after Cesarean) on their problem list.  Patient reports  sat-sun discomfort in abdomen, pelvic pressure .  Contractions: Irritability. Vag. Bleeding: None.  Movement: Present. Denies leaking of fluid.   The following portions of the patient's history were reviewed and updated as appropriate: allergies, current medications, past family history, past medical history, past social history, past surgical history and problem list.   Objective:   Vitals:   08/26/23 1631  BP: 139/70  Pulse: 64  Weight: 216 lb (98 kg)    Fetal Status: Fetal Heart Rate (bpm): 150   Movement: Present     General:  Alert, oriented and cooperative. Patient is in no acute distress.  Skin: Skin is warm and dry. No rash noted.   Cardiovascular: Normal heart rate noted  Respiratory: Normal respiratory effort, no problems with respiration noted  Abdomen: Soft, gravid, appropriate for gestational age.  Pain/Pressure: Present     Pelvic: Cervical exam deferred        Extremities: Normal range of motion.  Edema: Trace  Mental Status: Normal mood and affect. Normal behavior. Normal judgment and thought content.   Assessment and Plan:  Pregnancy: [id number] at [redacted]w[redacted]d 1. Supervision of high risk pregnancy, antepartum BP and FHR normal  Feeling regular fetal movement   2. Chronic hypertension affecting pregnancy Normotensive, no pec  s&s On ASA and amlodipine  Precautions discussed  10/9 u/s EFW 89%, ac 95%, afi normal , BPP 8/8 Continue antenatal testing, u/s 11/6  3. Multigravida of advanced maternal age in third trimester On ASA  4. Fetal echogenic intracardiac focus on prenatal ultrasound LR nips  5. Language barrier In person interpreter present  6. History of cesarean delivery 7. VBAC (vaginal birth after Cesarean) C/s 2005, vbac x3, Desires VBAC  Signed consent previously   8. [redacted] weeks gestation of pregnancy Anticipatory guidance regarding upcoming appts  Supportive measures discussed regarding pelvic pain, maternity belt given  Preterm labor symptoms and general obstetric precautions including but not limited to vaginal bleeding, contractions, leaking of fluid and fetal movement were reviewed in detail with the patient. Please refer to After Visit Summary for other counseling recommendations.   Return in about 2 weeks (around 09/09/2023) for OB VISIT (MD ONLY).  Future Appointments  Date Time Provider Department Center  08/28/2023  3:15 PM [facility] NST [facility]  09/04/2023  2:15 PM [facility] NST [facility]  09/11/2023  3:15 PM [facility] NST [facility]  09/18/2023  2:30 PM [facility] US2 [facility]    Albertine Grates, FNP

## 2023-08-28 ENCOUNTER — Other Ambulatory Visit: Payer: Self-pay

## 2023-08-28 ENCOUNTER — Ambulatory Visit: Payer: Self-pay | Attending: Maternal & Fetal Medicine | Admitting: *Deleted

## 2023-08-28 DIAGNOSIS — O09523 Supervision of elderly multigravida, third trimester: Secondary | ICD-10-CM | POA: Insufficient documentation

## 2023-08-28 DIAGNOSIS — O10013 Pre-existing essential hypertension complicating pregnancy, third trimester: Secondary | ICD-10-CM | POA: Insufficient documentation

## 2023-08-28 DIAGNOSIS — Z3A33 33 weeks gestation of pregnancy: Secondary | ICD-10-CM | POA: Insufficient documentation

## 2023-08-28 DIAGNOSIS — O10913 Unspecified pre-existing hypertension complicating pregnancy, third trimester: Secondary | ICD-10-CM

## 2023-08-28 NOTE — Procedures (Signed)
Bonnie Morales [birth year] [redacted]w[redacted]d  Fetus A Non-Stress Test Interpretation for 08/28/23  NST only  Indication: Chronic Hypertenstion, AMA  Fetal Heart Rate A Mode: External Baseline Rate (A): 130 bpm Variability: Moderate Accelerations: 15 x 15 Decelerations: None Multiple birth?: No  Uterine Activity Mode: Palpation, Toco Contraction Frequency (min): none Resting Tone Palpated: Relaxed  Interpretation (Fetal Testing) Nonstress Test Interpretation: Reactive Overall Impression: Reassuring for gestational age Comments: Dr. Darra Lis reviewed tracing

## 2023-09-04 ENCOUNTER — Ambulatory Visit: Payer: Self-pay | Attending: Maternal & Fetal Medicine | Admitting: *Deleted

## 2023-09-04 ENCOUNTER — Other Ambulatory Visit: Payer: Self-pay

## 2023-09-04 VITALS — BP 145/69

## 2023-09-04 DIAGNOSIS — O09523 Supervision of elderly multigravida, third trimester: Secondary | ICD-10-CM

## 2023-09-04 DIAGNOSIS — Z3A34 34 weeks gestation of pregnancy: Secondary | ICD-10-CM | POA: Insufficient documentation

## 2023-09-04 DIAGNOSIS — O10913 Unspecified pre-existing hypertension complicating pregnancy, third trimester: Secondary | ICD-10-CM | POA: Insufficient documentation

## 2023-09-04 NOTE — Procedures (Signed)
Bonnie Morales 26-Apr-1981 [redacted]w[redacted]d  Fetus A Non-Stress Test Interpretation for 09/04/23  Indication: Advanced Maternal Age >40 years, CHTN  Fetal Heart Rate A Mode: External Baseline Rate (A): 140 bpm Variability: Moderate Accelerations: 15 x 15 Decelerations: None Multiple birth?: No  Uterine Activity Mode: Palpation, Toco Contraction Frequency (min): none Resting Tone Palpated: Relaxed  Interpretation (Fetal Testing) Nonstress Test Interpretation: Reactive Overall Impression: Reassuring for gestational age Comments: Dr. Parke Poisson reviewed tracing

## 2023-09-09 ENCOUNTER — Other Ambulatory Visit: Payer: Self-pay

## 2023-09-09 ENCOUNTER — Ambulatory Visit (INDEPENDENT_AMBULATORY_CARE_PROVIDER_SITE_OTHER): Payer: Self-pay | Admitting: Obstetrics & Gynecology

## 2023-09-09 VITALS — BP 132/88 | HR 74 | Wt 218.2 lb

## 2023-09-09 DIAGNOSIS — O34219 Maternal care for unspecified type scar from previous cesarean delivery: Secondary | ICD-10-CM

## 2023-09-09 DIAGNOSIS — O09523 Supervision of elderly multigravida, third trimester: Secondary | ICD-10-CM

## 2023-09-09 DIAGNOSIS — O0993 Supervision of high risk pregnancy, unspecified, third trimester: Secondary | ICD-10-CM

## 2023-09-09 DIAGNOSIS — O24415 Gestational diabetes mellitus in pregnancy, controlled by oral hypoglycemic drugs: Secondary | ICD-10-CM

## 2023-09-09 DIAGNOSIS — O10913 Unspecified pre-existing hypertension complicating pregnancy, third trimester: Secondary | ICD-10-CM

## 2023-09-09 DIAGNOSIS — O0990 Supervision of high risk pregnancy, unspecified, unspecified trimester: Secondary | ICD-10-CM

## 2023-09-09 DIAGNOSIS — O10919 Unspecified pre-existing hypertension complicating pregnancy, unspecified trimester: Secondary | ICD-10-CM

## 2023-09-09 DIAGNOSIS — Z3A35 35 weeks gestation of pregnancy: Secondary | ICD-10-CM

## 2023-09-09 NOTE — Progress Notes (Signed)
   PRENATAL VISIT NOTE  Subjective:  Bonnie Morales is a 42 y.o. [id number] at [redacted]w[redacted]d being seen today for ongoing prenatal care.  She is currently monitored for the following issues for this high-risk pregnancy and has History of cesarean delivery; Language barrier; History of cervical incompetence (cerclage in G3 and G4); history of gestational diabetes; Chronic hypertension affecting pregnancy; AMA (advanced maternal age) multigravida 35+; Supervision of high risk pregnancy, antepartum; Fetal echogenic intracardiac focus on prenatal ultrasound; Unwanted fertility; and VBAC (vaginal birth after Cesarean) on their problem list.  Patient reports occasional contractions.  Contractions: Irritability. Vag. Bleeding: None.  Movement: Present. Denies leaking of fluid.   The following portions of the patient's history were reviewed and updated as appropriate: allergies, current medications, past family history, past medical history, past social history, past surgical history and problem list.   Objective:   Vitals:   09/09/23 1515  BP: 132/88  Pulse: 74  Weight: 218 lb 3.2 oz (99 kg)    Fetal Status: Fetal Heart Rate (bpm): 130   Movement: Present     General:  Alert, oriented and cooperative. Patient is in no acute distress.  Skin: Skin is warm and dry. No rash noted.   Cardiovascular: Normal heart rate noted  Respiratory: Normal respiratory effort, no problems with respiration noted  Abdomen: Soft, gravid, appropriate for gestational age.  Pain/Pressure: Present     Pelvic: Cervical exam performed in the presence of a chaperone Dilation: Closed Effacement (%): 30 Station: Ballotable  Extremities: Normal range of motion.  Edema: Trace (ankles)  Mental Status: Normal mood and affect. Normal behavior. Normal judgment and thought content.   Assessment and Plan:  Pregnancy: [id number] at [redacted]w[redacted]d 1. Chronic hypertension affecting pregnancy BP followed  2. history of gestational diabetes   3.  Supervision of high risk pregnancy, antepartum   4. VBAC (vaginal birth after Cesarean)   5. Multigravida of advanced maternal age in third trimester   Preterm labor symptoms and general obstetric precautions including but not limited to vaginal bleeding, contractions, leaking of fluid and fetal movement were reviewed in detail with the patient. Please refer to After Visit Summary for other counseling recommendations.   Return in about 1 week (around 09/16/2023).  Future Appointments  Date Time Provider Department Center  09/11/2023  3:15 PM [facility] NST [facility]  09/18/2023  2:30 PM [facility] US2 [facility]    Scheryl Darter, MD

## 2023-09-11 ENCOUNTER — Ambulatory Visit: Payer: Self-pay | Attending: Maternal & Fetal Medicine

## 2023-09-18 ENCOUNTER — Ambulatory Visit: Payer: Self-pay | Attending: Maternal & Fetal Medicine

## 2023-09-18 ENCOUNTER — Other Ambulatory Visit (HOSPITAL_COMMUNITY)
Admission: RE | Admit: 2023-09-18 | Discharge: 2023-09-18 | Disposition: A | Discharge status: Home / Self Care | Payer: Self-pay | Source: Ambulatory Visit | Attending: Obstetrics and Gynecology | Admitting: Obstetrics and Gynecology

## 2023-09-18 ENCOUNTER — Other Ambulatory Visit: Payer: Self-pay

## 2023-09-18 ENCOUNTER — Encounter: Payer: Self-pay | Admitting: Obstetrics and Gynecology

## 2023-09-18 ENCOUNTER — Ambulatory Visit (INDEPENDENT_AMBULATORY_CARE_PROVIDER_SITE_OTHER): Payer: Self-pay | Admitting: Obstetrics and Gynecology

## 2023-09-18 VITALS — BP 133/78 | HR 84 | Wt 218.0 lb

## 2023-09-18 DIAGNOSIS — O99213 Obesity complicating pregnancy, third trimester: Secondary | ICD-10-CM

## 2023-09-18 DIAGNOSIS — O0990 Supervision of high risk pregnancy, unspecified, unspecified trimester: Secondary | ICD-10-CM

## 2023-09-18 DIAGNOSIS — O34219 Maternal care for unspecified type scar from previous cesarean delivery: Secondary | ICD-10-CM

## 2023-09-18 DIAGNOSIS — E669 Obesity, unspecified: Secondary | ICD-10-CM

## 2023-09-18 DIAGNOSIS — O3412 Maternal care for benign tumor of corpus uteri, second trimester: Secondary | ICD-10-CM

## 2023-09-18 DIAGNOSIS — O10013 Pre-existing essential hypertension complicating pregnancy, third trimester: Secondary | ICD-10-CM

## 2023-09-18 DIAGNOSIS — O0993 Supervision of high risk pregnancy, unspecified, third trimester: Secondary | ICD-10-CM

## 2023-09-18 DIAGNOSIS — Z603 Acculturation difficulty: Secondary | ICD-10-CM

## 2023-09-18 DIAGNOSIS — O09293 Supervision of pregnancy with other poor reproductive or obstetric history, third trimester: Secondary | ICD-10-CM

## 2023-09-18 DIAGNOSIS — Z3A36 36 weeks gestation of pregnancy: Secondary | ICD-10-CM

## 2023-09-18 DIAGNOSIS — Z758 Other problems related to medical facilities and other health care: Secondary | ICD-10-CM

## 2023-09-18 DIAGNOSIS — Z3009 Encounter for other general counseling and advice on contraception: Secondary | ICD-10-CM

## 2023-09-18 DIAGNOSIS — D259 Leiomyoma of uterus, unspecified: Secondary | ICD-10-CM

## 2023-09-18 DIAGNOSIS — O10913 Unspecified pre-existing hypertension complicating pregnancy, third trimester: Secondary | ICD-10-CM | POA: Insufficient documentation

## 2023-09-18 DIAGNOSIS — O09213 Supervision of pregnancy with history of pre-term labor, third trimester: Secondary | ICD-10-CM

## 2023-09-18 DIAGNOSIS — O09523 Supervision of elderly multigravida, third trimester: Secondary | ICD-10-CM

## 2023-09-18 DIAGNOSIS — O10919 Unspecified pre-existing hypertension complicating pregnancy, unspecified trimester: Secondary | ICD-10-CM

## 2023-09-18 NOTE — Progress Notes (Signed)
   PRENATAL VISIT NOTE  Subjective:  Bonnie Morales is a 42 y.o. [id number] at [redacted]w[redacted]d being seen today for ongoing prenatal care.  She is currently monitored for the following issues for this high-risk pregnancy and has History of cesarean delivery; Language barrier; Chronic hypertension affecting pregnancy; AMA (advanced maternal age) multigravida 35+; Supervision of high risk pregnancy, antepartum; Fetal echogenic intracardiac focus on prenatal ultrasound; Unwanted fertility; and VBAC (vaginal birth after Cesarean) on their problem list.  Patient reports no complaints.  Contractions: Irritability. Vag. Bleeding: None.  Movement: Present. Denies leaking of fluid.   The following portions of the patient's history were reviewed and updated as appropriate: allergies, current medications, past family history, past medical history, past social history, past surgical history and problem list.   Objective:   Vitals:   09/18/23 1315  BP: 133/78  Pulse: 84  Weight: 218 lb (98.9 kg)    Fetal Status: Fetal Heart Rate (bpm): 162   Movement: Present     General:  Alert, oriented and cooperative. Patient is in no acute distress.  Skin: Skin is warm and dry. No rash noted.   Cardiovascular: Normal heart rate noted  Respiratory: Normal respiratory effort, no problems with respiration noted  Abdomen: Soft, gravid, appropriate for gestational age.  Pain/Pressure: Present (pressure)     Pelvic: Cervical exam deferred Dilation: Fingertip Effacement (%): 20 Station: Ballotable  Extremities: Normal range of motion.  Edema: Trace  Mental Status: Normal mood and affect. Normal behavior. Normal judgment and thought content.   Assessment and Plan:  Pregnancy: [id number] at [redacted]w[redacted]d 1. Supervision of high risk pregnancy, antepartum Routine care. BTL if for c/s - GC/Chlamydia probe amp ([place])not at [facility] - Culture, beta strep (group b only)  2. [redacted] weeks gestation of pregnancy  3. Multigravida of  advanced maternal age in third trimester Low risk panorama Continue weekly testing; pt set up for 11/24 @ 2345 IOL 10/9: 89%, 2324gm, ac 95%, 8/8, afi 17, ceph  4. Chronic hypertension affecting pregnancy Continue norvasc 10 qday  5. VBAC (vaginal birth after Cesarean) H/o VBAC x 3. [place] consent already signed  6. Language barrier In person interpreter used  Preterm labor symptoms and general obstetric precautions including but not limited to vaginal bleeding, contractions, leaking of fluid and fetal movement were reviewed in detail with the patient. Please refer to After Visit Summary for other counseling recommendations.   No follow-ups on file.  Future Appointments  Date Time Provider Department Center  09/18/2023  2:30 PM [facility] US2 [facility]  09/26/2023  1:15 PM Milas Hock, MD [facility]  10/03/2023  1:15 PM Milas Hock, MD [facility]    [employer] Bing, MD

## 2023-09-19 LAB — GC/CHLAMYDIA PROBE AMP (~~LOC~~) NOT AT ARMC
Chlamydia: NEGATIVE
Comment: NEGATIVE
Comment: NORMAL
Neisseria Gonorrhea: NEGATIVE

## 2023-09-20 ENCOUNTER — Other Ambulatory Visit: Payer: Self-pay | Admitting: *Deleted

## 2023-09-20 DIAGNOSIS — O10913 Unspecified pre-existing hypertension complicating pregnancy, third trimester: Secondary | ICD-10-CM

## 2023-09-20 DIAGNOSIS — O09523 Supervision of elderly multigravida, third trimester: Secondary | ICD-10-CM

## 2023-09-22 LAB — CULTURE, BETA STREP (GROUP B ONLY): Strep Gp B Culture: NEGATIVE

## 2023-09-25 ENCOUNTER — Ambulatory Visit: Payer: Self-pay | Attending: Obstetrics and Gynecology | Admitting: *Deleted

## 2023-09-25 ENCOUNTER — Inpatient Hospital Stay (HOSPITAL_COMMUNITY)
Admission: AD | Admit: 2023-09-25 | Discharge: 2023-09-27 | DRG: 807 | Disposition: A | Discharge status: Home / Self Care | Payer: Medicaid Other | Attending: Obstetrics and Gynecology | Admitting: Obstetrics and Gynecology

## 2023-09-25 ENCOUNTER — Encounter (HOSPITAL_COMMUNITY): Payer: Self-pay | Admitting: Obstetrics and Gynecology

## 2023-09-25 ENCOUNTER — Other Ambulatory Visit: Payer: Self-pay

## 2023-09-25 ENCOUNTER — Ambulatory Visit: Payer: Self-pay | Admitting: *Deleted

## 2023-09-25 ENCOUNTER — Ambulatory Visit: Payer: Self-pay | Attending: Maternal & Fetal Medicine | Admitting: Maternal & Fetal Medicine

## 2023-09-25 VITALS — BP 151/65 | HR 58

## 2023-09-25 VITALS — BP 150/72

## 2023-09-25 DIAGNOSIS — O1092 Unspecified pre-existing hypertension complicating childbirth: Secondary | ICD-10-CM | POA: Diagnosis present

## 2023-09-25 DIAGNOSIS — Z603 Acculturation difficulty: Secondary | ICD-10-CM | POA: Diagnosis present

## 2023-09-25 DIAGNOSIS — Z8249 Family history of ischemic heart disease and other diseases of the circulatory system: Secondary | ICD-10-CM | POA: Diagnosis not present

## 2023-09-25 DIAGNOSIS — O3413 Maternal care for benign tumor of corpus uteri, third trimester: Secondary | ICD-10-CM | POA: Diagnosis present

## 2023-09-25 DIAGNOSIS — Z87442 Personal history of urinary calculi: Secondary | ICD-10-CM | POA: Diagnosis not present

## 2023-09-25 DIAGNOSIS — Z79899 Other long term (current) drug therapy: Secondary | ICD-10-CM | POA: Diagnosis not present

## 2023-09-25 DIAGNOSIS — Z833 Family history of diabetes mellitus: Secondary | ICD-10-CM | POA: Diagnosis not present

## 2023-09-25 DIAGNOSIS — O10013 Pre-existing essential hypertension complicating pregnancy, third trimester: Secondary | ICD-10-CM | POA: Diagnosis not present

## 2023-09-25 DIAGNOSIS — O0990 Supervision of high risk pregnancy, unspecified, unspecified trimester: Secondary | ICD-10-CM

## 2023-09-25 DIAGNOSIS — O09523 Supervision of elderly multigravida, third trimester: Secondary | ICD-10-CM | POA: Diagnosis not present

## 2023-09-25 DIAGNOSIS — Z882 Allergy status to sulfonamides status: Secondary | ICD-10-CM

## 2023-09-25 DIAGNOSIS — Z98891 History of uterine scar from previous surgery: Secondary | ICD-10-CM

## 2023-09-25 DIAGNOSIS — Z7982 Long term (current) use of aspirin: Secondary | ICD-10-CM | POA: Diagnosis not present

## 2023-09-25 DIAGNOSIS — O34219 Maternal care for unspecified type scar from previous cesarean delivery: Secondary | ICD-10-CM | POA: Diagnosis present

## 2023-09-25 DIAGNOSIS — Z3A38 38 weeks gestation of pregnancy: Secondary | ICD-10-CM

## 2023-09-25 DIAGNOSIS — Z3A36 36 weeks gestation of pregnancy: Secondary | ICD-10-CM

## 2023-09-25 DIAGNOSIS — O10913 Unspecified pre-existing hypertension complicating pregnancy, third trimester: Secondary | ICD-10-CM

## 2023-09-25 DIAGNOSIS — Z810 Family history of intellectual disabilities: Secondary | ICD-10-CM

## 2023-09-25 DIAGNOSIS — O10919 Unspecified pre-existing hypertension complicating pregnancy, unspecified trimester: Secondary | ICD-10-CM

## 2023-09-25 DIAGNOSIS — O114 Pre-existing hypertension with pre-eclampsia, complicating childbirth: Principal | ICD-10-CM | POA: Diagnosis present

## 2023-09-25 DIAGNOSIS — D259 Leiomyoma of uterus, unspecified: Secondary | ICD-10-CM | POA: Insufficient documentation

## 2023-09-25 DIAGNOSIS — Z91040 Latex allergy status: Secondary | ICD-10-CM | POA: Diagnosis not present

## 2023-09-25 DIAGNOSIS — O34211 Maternal care for low transverse scar from previous cesarean delivery: Secondary | ICD-10-CM

## 2023-09-25 DIAGNOSIS — Z3A37 37 weeks gestation of pregnancy: Secondary | ICD-10-CM | POA: Diagnosis not present

## 2023-09-25 DIAGNOSIS — O99213 Obesity complicating pregnancy, third trimester: Secondary | ICD-10-CM | POA: Insufficient documentation

## 2023-09-25 DIAGNOSIS — Z87891 Personal history of nicotine dependence: Secondary | ICD-10-CM

## 2023-09-25 DIAGNOSIS — I10 Essential (primary) hypertension: Secondary | ICD-10-CM

## 2023-09-25 DIAGNOSIS — O1414 Severe pre-eclampsia complicating childbirth: Secondary | ICD-10-CM

## 2023-09-25 LAB — CBC
HCT: 35.7 % — ABNORMAL LOW (ref 36.0–46.0)
Hemoglobin: 11.7 g/dL — ABNORMAL LOW (ref 12.0–15.0)
MCH: 26.5 pg (ref 26.0–34.0)
MCHC: 32.8 g/dL (ref 30.0–36.0)
MCV: 81 fL (ref 80.0–100.0)
Platelets: 258 10*3/uL (ref 150–400)
RBC: 4.41 MIL/uL (ref 3.87–5.11)
RDW: 15.2 % (ref 11.5–15.5)
WBC: 8.6 10*3/uL (ref 4.0–10.5)
nRBC: 0 % (ref 0.0–0.2)

## 2023-09-25 LAB — COMPREHENSIVE METABOLIC PANEL
ALT: 13 U/L (ref 0–44)
AST: 26 U/L (ref 15–41)
Albumin: 2.7 g/dL — ABNORMAL LOW (ref 3.5–5.0)
Alkaline Phosphatase: 132 U/L — ABNORMAL HIGH (ref 38–126)
Anion gap: 11 (ref 5–15)
BUN: 7 mg/dL (ref 6–20)
CO2: 17 mmol/L — ABNORMAL LOW (ref 22–32)
Calcium: 8.8 mg/dL — ABNORMAL LOW (ref 8.9–10.3)
Chloride: 105 mmol/L (ref 98–111)
Creatinine, Ser: 0.45 mg/dL (ref 0.44–1.00)
GFR, Estimated: >60 mL/min (ref >60)
Glucose, Bld: 74 mg/dL (ref 70–99)
Potassium: 4.5 mmol/L (ref 3.5–5.1)
Sodium: 133 mmol/L — ABNORMAL LOW (ref 135–145)
Total Bilirubin: 0.4 mg/dL (ref <1.2)
Total Protein: 6.6 g/dL (ref 6.5–8.1)

## 2023-09-25 LAB — TYPE AND SCREEN
ABO/RH(D): O POS
Antibody Screen: NEGATIVE

## 2023-09-25 LAB — PROTEIN / CREATININE RATIO, URINE
Creatinine, Urine: 26 mg/dL
Protein Creatinine Ratio: 0.69 mg/mg{creat} — ABNORMAL HIGH (ref 0.00–0.15)
Total Protein, Urine: 18 mg/dL

## 2023-09-25 MED ORDER — OXYTOCIN-SODIUM CHLORIDE 30-0.9 UT/500ML-% IV SOLN
1.0000 m[IU]/min | INTRAVENOUS | Status: DC
  Administered 2023-09-25: 2 m[IU]/min via INTRAVENOUS

## 2023-09-25 MED ORDER — LABETALOL HCL 5 MG/ML IV SOLN: 40.0000 mg | INTRAVENOUS | Status: DC | PRN

## 2023-09-25 MED ORDER — MISOPROSTOL 200 MCG PO TABS
ORAL_TABLET | ORAL | Status: AC
  Administered 2023-09-25: 800 ug
  Filled 2023-09-25: qty 4

## 2023-09-25 MED ORDER — WITCH HAZEL-GLYCERIN EX PADS: 1.0000 | MEDICATED_PAD | CUTANEOUS | Status: DC | PRN

## 2023-09-25 MED ORDER — NIFEDIPINE ER OSMOTIC RELEASE 60 MG PO TB24
60.0000 mg | ORAL_TABLET | Freq: Every day | ORAL | Status: DC
  Administered 2023-09-26 – 2023-09-27 (×2): 60 mg via ORAL
  Filled 2023-09-25 (×3): qty 1

## 2023-09-25 MED ORDER — MEASLES, MUMPS & RUBELLA VAC IJ SOLR: 0.5000 mL | Freq: Once | INTRAMUSCULAR | Status: DC

## 2023-09-25 MED ORDER — HYDRALAZINE HCL 20 MG/ML IJ SOLN
10.0000 mg | INTRAMUSCULAR | Status: DC | PRN
Start: 2023-09-25 — End: 2023-09-25

## 2023-09-25 MED ORDER — FENTANYL CITRATE (PF) 100 MCG/2ML IJ SOLN
100.0000 ug | Freq: Once | INTRAMUSCULAR | Status: AC
  Administered 2023-09-25: 100 ug via INTRAVENOUS

## 2023-09-25 MED ORDER — IBUPROFEN 600 MG PO TABS
600.0000 mg | ORAL_TABLET | Freq: Four times a day (QID) | ORAL | Status: DC
Start: 2023-09-26 — End: 2023-09-27
  Administered 2023-09-25 – 2023-09-27 (×7): 600 mg via ORAL
  Filled 2023-09-25 (×7): qty 1

## 2023-09-25 MED ORDER — LABETALOL HCL 5 MG/ML IV SOLN
20.0000 mg | INTRAVENOUS | Status: DC | PRN
Start: 2023-09-25 — End: 2023-09-25

## 2023-09-25 MED ORDER — DIPHENHYDRAMINE HCL 25 MG PO CAPS: 25.0000 mg | ORAL_CAPSULE | Freq: Four times a day (QID) | ORAL | Status: DC | PRN

## 2023-09-25 MED ORDER — OXYTOCIN BOLUS FROM INFUSION
333.0000 mL | Freq: Once | INTRAVENOUS | Status: AC
  Administered 2023-09-25: 333 mL via INTRAVENOUS

## 2023-09-25 MED ORDER — SENNOSIDES-DOCUSATE SODIUM 8.6-50 MG PO TABS
2.0000 | ORAL_TABLET | ORAL | Status: DC
  Administered 2023-09-25 – 2023-09-26 (×2): 2 via ORAL
  Filled 2023-09-25 (×2): qty 2

## 2023-09-25 MED ORDER — PHENYLEPHRINE 80 MCG/ML (10ML) SYRINGE FOR IV PUSH (FOR BLOOD PRESSURE SUPPORT): 80.0000 ug | PREFILLED_SYRINGE | INTRAVENOUS | Status: DC | PRN

## 2023-09-25 MED ORDER — LIDOCAINE HCL (PF) 1 % IJ SOLN: 30.0000 mL | INTRAMUSCULAR | Status: DC | PRN

## 2023-09-25 MED ORDER — LABETALOL HCL 5 MG/ML IV SOLN
80.0000 mg | INTRAVENOUS | Status: DC | PRN
Start: 2023-09-25 — End: 2023-09-25

## 2023-09-25 MED ORDER — MISOPROSTOL 200 MCG PO TABS: 800.0000 ug | ORAL_TABLET | Freq: Once | ORAL | Status: DC

## 2023-09-25 MED ORDER — ONDANSETRON HCL 4 MG/2ML IJ SOLN: 4.0000 mg | INTRAMUSCULAR | Status: DC | PRN

## 2023-09-25 MED ORDER — OXYTOCIN-SODIUM CHLORIDE 30-0.9 UT/500ML-% IV SOLN
2.5000 [IU]/h | INTRAVENOUS | Status: DC
  Filled 2023-09-25: qty 500

## 2023-09-25 MED ORDER — TRANEXAMIC ACID-NACL 1000-0.7 MG/100ML-% IV SOLN: 1000.0000 mg | INTRAVENOUS | Status: DC

## 2023-09-25 MED ORDER — ACETAMINOPHEN 325 MG PO TABS: 650.0000 mg | ORAL_TABLET | ORAL | Status: DC | PRN

## 2023-09-25 MED ORDER — ACETAMINOPHEN 325 MG PO TABS
650.0000 mg | ORAL_TABLET | ORAL | Status: DC | PRN
  Administered 2023-09-26 (×2): 650 mg via ORAL
  Filled 2023-09-25 (×2): qty 2

## 2023-09-25 MED ORDER — EPHEDRINE 5 MG/ML INJ: 10.0000 mg | INTRAVENOUS | Status: DC | PRN

## 2023-09-25 MED ORDER — PHENYLEPHRINE 80 MCG/ML (10ML) SYRINGE FOR IV PUSH (FOR BLOOD PRESSURE SUPPORT)
80.0000 ug | PREFILLED_SYRINGE | INTRAVENOUS | Status: DC | PRN
Start: 2023-09-25 — End: 2023-09-25

## 2023-09-25 MED ORDER — MAGNESIUM SULFATE 40 GM/1000ML IV SOLN
2.0000 g/h | INTRAVENOUS | Status: DC
  Administered 2023-09-25: 2 g/h via INTRAVENOUS
  Filled 2023-09-25: qty 1000

## 2023-09-25 MED ORDER — FENTANYL CITRATE (PF) 100 MCG/2ML IJ SOLN
INTRAMUSCULAR | Status: AC
  Filled 2023-09-25: qty 2

## 2023-09-25 MED ORDER — MAGNESIUM SULFATE 40 GM/1000ML IV SOLN
2.0000 g/h | INTRAVENOUS | Status: AC
  Administered 2023-09-26: 2 g/h via INTRAVENOUS
  Filled 2023-09-25: qty 1000

## 2023-09-25 MED ORDER — TRANEXAMIC ACID-NACL 1000-0.7 MG/100ML-% IV SOLN
INTRAVENOUS | Status: AC
  Administered 2023-09-25: 1000 mg
  Filled 2023-09-25: qty 100

## 2023-09-25 MED ORDER — FUROSEMIDE 20 MG PO TABS
20.0000 mg | ORAL_TABLET | Freq: Every day | ORAL | Status: DC
  Administered 2023-09-26 – 2023-09-27 (×2): 20 mg via ORAL
  Filled 2023-09-25 (×2): qty 1

## 2023-09-25 MED ORDER — OXYCODONE HCL 5 MG PO TABS
5.0000 mg | ORAL_TABLET | ORAL | Status: DC | PRN
  Administered 2023-09-27: 5 mg via ORAL
  Filled 2023-09-25: qty 1

## 2023-09-25 MED ORDER — DIPHENHYDRAMINE HCL 50 MG/ML IJ SOLN: 12.5000 mg | INTRAMUSCULAR | Status: DC | PRN

## 2023-09-25 MED ORDER — TETANUS-DIPHTH-ACELL PERTUSSIS 5-2.5-18.5 LF-MCG/0.5 IM SUSY: 0.5000 mL | PREFILLED_SYRINGE | Freq: Once | INTRAMUSCULAR | Status: DC

## 2023-09-25 MED ORDER — SOD CITRATE-CITRIC ACID 500-334 MG/5ML PO SOLN
30.0000 mL | ORAL | Status: DC | PRN
Start: 2023-09-25 — End: 2023-09-25

## 2023-09-25 MED ORDER — MAGNESIUM SULFATE BOLUS VIA INFUSION
4.0000 g | Freq: Once | INTRAVENOUS | Status: AC
  Administered 2023-09-25: 4 g via INTRAVENOUS
  Filled 2023-09-25: qty 1000

## 2023-09-25 MED ORDER — DIBUCAINE (PERIANAL) 1 % EX OINT: 1.0000 | TOPICAL_OINTMENT | CUTANEOUS | Status: DC | PRN

## 2023-09-25 MED ORDER — ZOLPIDEM TARTRATE 5 MG PO TABS: 5.0000 mg | ORAL_TABLET | Freq: Every evening | ORAL | Status: DC | PRN

## 2023-09-25 MED ORDER — BENZOCAINE-MENTHOL 20-0.5 % EX AERO: 1.0000 | INHALATION_SPRAY | CUTANEOUS | Status: DC | PRN

## 2023-09-25 MED ORDER — PRENATAL MULTIVITAMIN CH
1.0000 | ORAL_TABLET | Freq: Every day | ORAL | Status: DC
  Administered 2023-09-26 – 2023-09-27 (×2): 1 via ORAL
  Filled 2023-09-25 (×2): qty 1

## 2023-09-25 MED ORDER — SIMETHICONE 80 MG PO CHEW: 80.0000 mg | CHEWABLE_TABLET | ORAL | Status: DC | PRN

## 2023-09-25 MED ORDER — TERBUTALINE SULFATE 1 MG/ML IJ SOLN: 0.2500 mg | Freq: Once | INTRAMUSCULAR | Status: DC | PRN

## 2023-09-25 MED ORDER — ONDANSETRON HCL 4 MG PO TABS: 4.0000 mg | ORAL_TABLET | ORAL | Status: DC | PRN

## 2023-09-25 MED ORDER — SODIUM CHLORIDE 0.9% FLUSH: 10.0000 mL | Freq: Two times a day (BID) | INTRAVENOUS | Status: DC

## 2023-09-25 MED ORDER — FENTANYL CITRATE (PF) 100 MCG/2ML IJ SOLN: 50.0000 ug | INTRAMUSCULAR | Status: DC | PRN

## 2023-09-25 MED ORDER — FENTANYL-BUPIVACAINE-NACL 0.5-0.125-0.9 MG/250ML-% EP SOLN
12.0000 mL/h | EPIDURAL | Status: DC | PRN
Start: 2023-09-25 — End: 2023-09-25
  Filled 2023-09-25: qty 250

## 2023-09-25 MED ORDER — ONDANSETRON HCL 4 MG/2ML IJ SOLN
4.0000 mg | Freq: Four times a day (QID) | INTRAMUSCULAR | Status: DC | PRN
Start: 2023-09-25 — End: 2023-09-25

## 2023-09-25 MED ORDER — ACETAMINOPHEN-CAFFEINE 500-65 MG PO TABS
2.0000 | ORAL_TABLET | Freq: Once | ORAL | Status: AC
  Administered 2023-09-25: 2 via ORAL
  Filled 2023-09-25: qty 2

## 2023-09-25 MED ORDER — LACTATED RINGERS IV SOLN: 500.0000 mL | INTRAVENOUS | Status: DC | PRN

## 2023-09-25 MED ORDER — LACTATED RINGERS IV SOLN: INTRAVENOUS | Status: DC

## 2023-09-25 MED ORDER — COCONUT OIL OIL: 1.0000 | TOPICAL_OIL | Status: DC | PRN

## 2023-09-25 MED ORDER — LACTATED RINGERS IV SOLN
500.0000 mL | Freq: Once | INTRAVENOUS | Status: AC
  Administered 2023-09-25: 500 mL via INTRAVENOUS

## 2023-09-25 NOTE — Procedures (Signed)
Bonnie Morales [birth year] [redacted]w[redacted]d  Fetus A Non-Stress Test Interpretation for 09/25/23- NST only  Indication: Chronic Hypertenstion, Advanced Maternal Age >40 years, and ut Fibroids , Obese  Fetal Heart Rate A Mode: External Baseline Rate (A): 130 bpm Variability: Moderate Accelerations: 15 x 15 Decelerations: None Multiple birth?: No  Uterine Activity Mode: Toco Contraction Frequency (min): irreg Contraction Duration (sec): 50-90 Contraction Quality: Mild  Interpretation (Fetal Testing) Nonstress Test Interpretation: Reactive Comments: Tracing reviewed by DR. Schaible

## 2023-09-25 NOTE — Discharge Summary (Signed)
Postpartum Discharge Summary  Patient Name: Bonnie Morales DOB: May 29, 1981 MRN: [id number] OB Clinic: [facility]  Date of admission: 09/25/2023 Delivery date:09/25/2023 Delivering provider: Sundra Aland Date of discharge: 09/27/2023  Admitting diagnosis: Pregnancy at 37/2. IOL for poorly controlled CHTN. AMA  Additional problems: None    Discharge diagnosis: Term Pregnancy Delivered and Preeclampsia (severe)                                              Post partum procedures: none Augmentation: AROM and Pitocin Complications: None  Hospital course: Induction of Labor With Vaginal Delivery   42 y.o. yo [phone] at [redacted]w[redacted]d was admitted to the hospital 09/25/2023 for induction of labor.  Indication for induction: Superimposed Preeclampsia with SF.  Patient had an labor course complicated by development of severe pre-e due to elevated BPs, requiring magnesium and labetalol. Membrane Rupture Time/Date: 2:05 PM,09/25/2023  Delivery Method:Vaginal, Spontaneous Operative Delivery:N/A Episiotomy: None Lacerations:  None Details of delivery can be found in separate delivery note.  Patient had a postpartum course that was uncomplicated. She received 24 hours of Mg and was discharged home on BP medications.. Patient is discharged home 09/27/23.  Newborn Data: Birth date:09/25/2023 Birth time:8:22 PM Gender:Female Living status:Living Apgars:7 ,9  Weight:3190 g  Magnesium Sulfate received: Yes: Seizure prophylaxis BMZ received: No Rhophylac:N/A MMR:N/A T-DaP:Given prenatally Flu: Yes RSV Vaccine received: No Transfusion:No  Immunizations received: Immunization History  Administered Date(s) Administered   Influenza, Seasonal, Injecte, Preservative Fre 07/22/2023   Influenza,inj,Quad PF,6+ Mos 08/01/2017, 07/19/2021   Influenza-Unspecified 02/11/2017   PFIZER(Purple Top)SARS-COV-2 Vaccination 02/12/2020, 03/04/2020   Tdap 06/02/2015,  06/17/2017, 07/22/2023    Physical exam  Vitals:   09/26/23 2203 09/26/23 2342 09/27/23 0512 09/27/23 0814  BP: 133/66 125/64  133/60  Pulse: 72 72  67  Resp:  16 18 18   Temp:  97.8 F (36.6 C) 98.1 F (36.7 C) 98.4 F (36.9 C)  TempSrc:  Oral Oral Oral  SpO2:  100%  100%  Weight:      Height:       General: alert Lochia: appropriate Uterine Fundus: firm, nttp Incision: n/a DVT Evaluation: No evidence of DVT seen on physical exam. Labs: Lab Results  Component Value Date   WBC 12.4 (H) 09/26/2023   HGB 10.8 (L) 09/26/2023   HCT 32.7 (L) 09/26/2023   MCV 81.1 09/26/2023   PLT 256 09/26/2023      Latest Ref Rng & Units 09/25/2023   12:42 PM  CMP  Glucose 70 - 99 mg/dL 74   BUN 6 - 20 mg/dL 7   Creatinine 9.52 - 8.41 mg/dL 3.24   Sodium 401 - 027 mmol/L 133   Potassium 3.5 - 5.1 mmol/L 4.5   Chloride 98 - 111 mmol/L 105   CO2 22 - 32 mmol/L 17   Calcium 8.9 - 10.3 mg/dL 8.8   Total Protein 6.5 - 8.1 g/dL 6.6   Total Bilirubin <2.5 mg/dL 0.4   Alkaline Phos 38 - 126 U/L 132   AST 15 - 41 U/L 26   ALT 0 - 44 U/L 13    Edinburgh Score:    09/26/2023    8:48 AM  Edinburgh Postnatal Depression Scale Screening Tool  I have been able to laugh and see the funny side of things. 0  I have looked  forward with enjoyment to things. 0  I have blamed myself unnecessarily when things went wrong. 1  I have been anxious or worried for no good reason. 1  I have felt scared or panicky for no good reason. 1  Things have been getting on top of me. 0  I have been so unhappy that I have had difficulty sleeping. 0  I have felt sad or miserable. 0  I have been so unhappy that I have been crying. 0  The thought of harming myself has occurred to me. 0  Edinburgh Postnatal Depression Scale Total 3   Edinburgh Postnatal Depression Scale Total: 3   After visit meds:  Allergies as of 09/27/2023       Reactions   Latex    burning   Sulfa Antibiotics Hives   Reports high fever  and red rash on arms        Medication List     STOP taking these medications    amLODipine 10 MG tablet Commonly known as: NORVASC   aspirin EC 81 MG tablet       TAKE these medications    acetaminophen 325 MG tablet Commonly known as: Tylenol Take 2 tablets (650 mg total) by mouth every 6 (six) hours as needed (for pain scale < 4).   furosemide 20 MG tablet Commonly known as: LASIX Take 1 tablet (20 mg total) by mouth daily for 3 days.   ibuprofen 600 MG tablet Commonly known as: ADVIL Take 1 tablet (600 mg total) by mouth every 6 (six) hours as needed.   NIFEdipine 60 MG 24 hr tablet Commonly known as: ADALAT CC Take 1 tablet (60 mg total) by mouth daily.   prenatal multivitamin Tabs tablet Take 1 tablet by mouth daily.         Discharge home in stable condition Infant Feeding: Breast Infant Disposition:home with mother Discharge instruction: per After Visit Summary and Postpartum booklet. Activity: Advance as tolerated. Pelvic rest for 6 weeks.  Diet: routine diet Future Appointments: Future Appointments  Date Time Provider Department Center  10/02/2023  1:15 PM [facility] NURSE [facility]  10/02/2023  1:30 PM [facility] US4 [facility]   Follow up Visit:  Follow-up Information     [facility] Follow up.   Specialty: Obstetrics and Gynecology Contact information: [address] [place] [address] [phone]               Message sent to [facility] 11/13  Please schedule this patient for a In person postpartum visit in 4 weeks with the following provider: Any provider. Additional Postpartum F/U:BP check 1 week  High risk pregnancy complicated by: HTN, AMA, SIPE, VBAC Delivery mode:  Vaginal, Spontaneous Anticipated Birth Control:   IUD vs vasectomy , no bridge   09/27/2023 [place] Bing, MD

## 2023-09-25 NOTE — Progress Notes (Signed)
   Patient information  Patient Name: Bonnie Morales  Patient MRN:   [id number]  Referring practice: MFM Referring Provider: [facility])  MFM CONSULT  Bonnie Morales is a 42 y.o. [id number] at [redacted]w[redacted]d here for ultrasound and consultation.   The patient was here for nonstress test and a nurse visit where it was noted that her blood pressure was elevated at 150/72.  Her repeat blood pressure was 151/65.  She reports a headache, shortness of breath and nausea for the past 2 to 3 days.  She also reports some blurry vision.  I discussed my concern for preeclampsia and the need for delivery to avoid adverse maternal or neonatal outcomes.  The patient verbalized understanding and will go directly to the hospital for admission for delivery.  I notified the on-call OB provider to expect her arrival.  An in person Spanish interpreter was used for today's visit.  NST was reactive  Assessment 1. Chronic hypertension affecting pregnancy 2. History of cesarean delivery 3. VBAC (vaginal birth after Cesarean) 4. [redacted] weeks gestation of pregnancy Plan -Direct admit to labor and delivery for concern for impending preeclampsia versus exacerbation of chronic hypertension at 38 weeks. -Preeclampsia workup with laboratory assessment and serial blood pressure should also be done.  Review of Systems: A review of systems was performed and was negative except per HPI   Vitals and Physical Exam    09/25/2023   10:36 AM 09/18/2023    2:12 PM 09/18/2023    1:15 PM  Vitals with BMI  Weight   218 lbs  Systolic 151 130 956  Diastolic 65 67 78  Pulse 58 65 84    Sitting comfortably on the sonogram table Nonlabored breathing Normal rate and rhythm Abdomen is nontender  Past pregnancies OB History  Gravida Para Term Preterm AB Living  5 4 3 1  0 4  SAB IAB Ectopic Multiple Live Births  0 0 0 0 4    # Outcome Date GA Lbr Len/2nd Weight Sex Type Anes PTL Lv  5 Current            4 Term 12/28/21 [redacted]w[redacted]d 08:00 / 00:11 2.94 kg F VBAC EPI  LIV  3 Term 08/17/17 [redacted]w[redacted]d 07:48 / 00:25 2.914 kg F VBAC EPI  LIV  2 Term 08/28/15 [redacted]w[redacted]d 05:04 / 03:28 2.88 kg M Vag-Spont EPI  LIV  1 Preterm 05/25/04 [redacted]w[redacted]d  2 kg  CS-Unspec   LIV     Complications: Placenta Previa     I spent 30 minutes reviewing the patients chart, including labs and images as well as counseling the patient about her medical conditions. Greater than 50% of the time was spent in direct face-to-face patient counseling.  Braxton Feathers  MFM, [facility]   09/25/2023  11:44 AM

## 2023-09-25 NOTE — H&P (Cosign Needed Addendum)
OBSTETRIC ADMISSION HISTORY AND PHYSICAL  Bonnie Morales is a 42 y.o. female [phone] with IUP at [redacted]w[redacted]d (dated by LMP, Estimated Date of Delivery: 10/14/23) presenting for IOL for uncontrolled cHTN.   She reports +FMs, No LOF, no VB, or peripheral edema, and RUQ pain.  Endorses headache - ongoing for weeks and unchanged and reports blurred vision intermittently, not at present.  She plans on breast feeding. She request IUD vs vasectomy for birth control.  She received her prenatal care at  [facility]    Prenatal History/Complications:  - cHTN - h/o C/S x1 followed by VBAC x 3 - AMA - Hx fibroids   Past Medical History: Past Medical History:  Diagnosis Date   AMA (advanced maternal age) multigravida 35+ 12/28/2021   Chronic hypertension    Gestational diabetes    History of cervical incompetence (cerclage in G3 and G4) 09/24/2017   Had cerclage, removed at 37 week visit  Current pregnancy:  Declined cerclage (see MFM note), getting serial CL scans     history of gestational diabetes 07/06/2021   Early 2  hr GTT normal     History of kidney stones    passed stone   History of VBAC 04/09/2017   c-section in [place]  VBAC x 1     Consent signed 08/01/2017   Hypertension    2014   Language barrier 06/17/2017   Pre-eclampsia     Past Surgical History: Past Surgical History:  Procedure Laterality Date   CERVICAL CERCLAGE N/A 04/12/2017   Procedure: CERCLAGE CERVICAL;  Surgeon: Hermina Staggers, MD;  Location: [facility] ORS;  Service: Gynecology;  Laterality: N/A;   CERVICAL CERCLAGE N/A 07/25/2021   Procedure: CERCLAGE CERVICAL;  Surgeon: Hermina Staggers, MD;  Location: [facility] LD ORS;  Service: Gynecology;  Laterality: N/A;   CESAREAN SECTION     epidural - in [place]    Obstetrical History: OB History     Gravida  5   Para  4   Term  3   Preterm  1   AB  0   Living  4      SAB  0   IAB  0   Ectopic  0   Multiple  0   Live Births  4            Social History Social History   Socioeconomic History   Marital status: Single    Spouse name: Not on file   Number of children: Not on file   Years of education: Not on file   Highest education level: Not on file  Occupational History   Not on file  Tobacco Use   Smoking status: Former    Current packs/day: 0.00    Average packs/day: 0.3 packs/day for 5.0 years (1.3 ttl pk-yrs)    Types: Cigarettes    Start date: 09/13/2011    Quit date: 09/12/2016    Years since quitting: 7.0   Smokeless tobacco: Never  Vaping Use   Vaping status: Never Used  Substance and Sexual Activity   Alcohol use: No   Drug use: No   Sexual activity: Yes  Other Topics Concern   Not on file  Social History Narrative   Not on file   Social Determinants of Health   Financial Resource Strain: Not on file  Food Insecurity: No Food Insecurity (09/25/2023)   Hunger Vital Sign    Worried About Running Out of Food in the Last Year: Never true  Ran Out of Food in the Last Year: Never true  Transportation Needs: No Transportation Needs (09/25/2023)   PRAPARE - Administrator, Civil Service (Medical): No    Lack of Transportation (Non-Medical): No  Physical Activity: Not on file  Stress: Not on file  Social Connections: Not on file    Family History: Family History  Problem Relation Age of Onset   Diabetes Mother    Hypertension Mother    Heart disease Mother        tachycardia   Hypertension Father    Mental retardation Sister    Cancer Paternal Grandmother     Allergies: Allergies  Allergen Reactions   Latex     burning   Sulfa Antibiotics Hives    Reports high fever and red rash on arms    Medications Prior to Admission  Medication Sig Dispense Refill Last Dose   amLODipine (NORVASC) 10 MG tablet Take 1 tablet (10 mg total) by mouth daily. 30 tablet 3    aspirin EC 81 MG tablet Take 1 tablet (81 mg total) by mouth daily. 60 tablet 2    Prenatal Vit-Fe Fumarate-FA  (PRENATAL MULTIVITAMIN) TABS tablet Take 1 tablet by mouth daily.        Review of Systems  All systems reviewed and negative except as stated in HPI.  Blood pressure (!) 150/72, pulse 69, temperature 98.4 F (36.9 C), resp. rate 18, last menstrual period 01/07/2023, unknown if currently breastfeeding. General appearance: alert, cooperative, and no distress Lungs: breathing comfortably on room air Heart: regular rate Abdomen: soft, non-tender; gravid Extremities: no edema of bilateral lower extremities Presentation: cephalic Fetal monitoringBaseline: 150 bpm, Variability: Good {> 6 bpm), and Accelerations: Reactive Uterine activityNone Dilation: 3 Effacement (%): 50 Station: -3 Exam by:: Dr. Haywood Lasso   Prenatal labs: ABO, Rh: --/--/PENDING (11/13 1248) Antibody: PENDING (11/13 1248) Rubella: 4.24 (05/23 1237) RPR: Non Reactive (09/09 0844)  HBsAg: Negative (05/23 1237)  HIV: Non Reactive (09/09 0844)  GBS: Negative/-- (11/06 0316)  2 hr Glucola passed Genetic screening  declined Anatomy US: placenta posterior, normal anatomy visualized Last [place]: At 36w - cephalic presentation, EFW 2996 (63 %tile), AC 78%  Prenatal Transfer Tool  Maternal Diabetes: No Genetic Screening: Declined Maternal Ultrasounds/Referrals: Normal Fetal Ultrasounds or other Referrals:  None Maternal Substance Abuse:  No Significant Maternal Medications:  Meds include: Other: Amlodipine Significant Maternal Lab Results:  Group B Strep negative Number of Prenatal Visits:greater than 3 verified prenatal visits Other Comments:  None  Results for orders placed or performed during the hospital encounter of 09/25/23 (from the past 24 hour(s))  Type and screen   Collection Time: 09/25/23 12:48 PM  Result Value Ref Range   ABO/RH(D) PENDING    Antibody Screen PENDING    Sample Expiration      09/28/2023,2359 Performed at [facility], [address], [place], [place] [address]     Patient Active  Problem List   Diagnosis Date Noted   Chronic hypertension during pregnancy, antepartum 09/25/2023   VBAC (vaginal birth after Cesarean) 08/08/2023   Fetal echogenic intracardiac focus on prenatal ultrasound 05/28/2023   Unwanted fertility 05/28/2023   Supervision of high risk pregnancy, antepartum 03/26/2023   AMA (advanced maternal age) multigravida 35+ 12/28/2021   Chronic hypertension affecting pregnancy 09/24/2017   Language barrier 06/17/2017   History of cesarean delivery 05/12/2015    Assessment/Plan:  Bonnie Morales is a 42 y.o. [id number] at [redacted]w[redacted]d here for IOL for  uncontrolled cHTN  #Labor:Early latent labor, initial exam 3/50/-3  Pitocin started  discussed AROM with patient, pt verbally consented  AROM with moderate amount of clear fluid #Pain: Epidural upon patient request  #FWB: Cat 1  #ID:  [id number]  #MOF: Breast  #MOC: vasectomy vs IUD, BTL if requires c-section #Circ:  no  #cHTN with concern for SIPE: Has been on amlodipine 10mg . Presented with pressures as high as 150/72 symptomatic with headaches. She has hx of preeclampsia with her first pregnancy. CBC,CMP, UPC labs pending.  - Magnesium and Labetalol protocol started  #Fibroid uterus: Has right sided fibroid measuring 5.68 x 4.43 x 4.54cm  #AMA  #Hx C/S and subsequent VBAC x 3  Mayra Reel DO PGY 2 09/25/23 1:03 PM    Attestation of Supervision of Resident:  I confirm that I have verified the information documented in the resident's note and that I have also personally performed the history, physical exam and all medical decision making activities.  I have verified that all services and findings are accurately documented in this resident's note; and I agree with management and plan as outlined in the documentation. I have also made any necessary editorial changes.  Sundra Aland, MD OB Fellow, Faculty [facility], [facility] for [facility]  09/25/23 4:15 PM

## 2023-09-26 ENCOUNTER — Encounter: Payer: Self-pay | Admitting: Obstetrics and Gynecology

## 2023-09-26 ENCOUNTER — Other Ambulatory Visit (HOSPITAL_COMMUNITY): Payer: Self-pay

## 2023-09-26 LAB — CBC
HCT: 32.7 % — ABNORMAL LOW (ref 36.0–46.0)
Hemoglobin: 10.8 g/dL — ABNORMAL LOW (ref 12.0–15.0)
MCH: 26.8 pg (ref 26.0–34.0)
MCHC: 33 g/dL (ref 30.0–36.0)
MCV: 81.1 fL (ref 80.0–100.0)
Platelets: 256 10*3/uL (ref 150–400)
RBC: 4.03 MIL/uL (ref 3.87–5.11)
RDW: 15.1 % (ref 11.5–15.5)
WBC: 12.4 10*3/uL — ABNORMAL HIGH (ref 4.0–10.5)
nRBC: 0 % (ref 0.0–0.2)

## 2023-09-26 LAB — RPR: RPR Ser Ql: NONREACTIVE

## 2023-09-26 NOTE — Lactation Note (Addendum)
This note was copied from a baby's chart. Lactation Consultation Note  Patient Name: Bonnie Morales [id number] Date: 09/26/2023 Age:41 hours Reason for consult: Follow-up assessment;Early term 37-38.6wks;Other (Comment) (AMA, cHTN)  Visited with family of 42 hours old ET female; Bonnie Morales is a P5 and experienced breastfeeding. She reported that baby "Bonnie Morales" latches on well but that she doesn't have "any milk" yet and he gets frustrated; that's why parents chose to supplement until her milk comes in. Explained the importance of breast stimulation around feeding times for the onset of lactogenesis II and the protect her supply. This LC also showed Bonnie Morales how to convert her DEBP kit into a hand pump. Baby Robert asleep at the time of [facility] consult, he just had a bottle of formula, asked family to call for latch assistance if needed. Bonnie Morales might be discharged tomorrow. Reviewed discharge education, normal ETI behavior, feeding cues, size of baby's stomach, pumping schedule, supplementation and anticipatory guidelines. She politely declined a referral to [facility] OP but has their contact info in case she needs to reach out.   Feeding Mother's Current Feeding Choice: Breast Milk and Formula Nipple Type: Slow - flow  Lactation Tools Discussed/Used Tools: Pump;Flanges Flange Size: 21 Breast pump type: Double-Electric Breast Pump Pump Education: Setup, frequency, and cleaning;Milk Storage Reason for Pumping: induction of lactation Pumping frequency: 2 times/24 hours, recommended pumping whenever baby is getting a bottle with formula Pumped volume:  (droplets)  Interventions Interventions: Breast feeding basics reviewed;DEBP;Education  Plan Encouraged to put baby to breast +8 times/24 hours or sooner if feeding cues are present She'll pump whenever baby is getting a bottle Parents will continue supplementing with Similac 20 calorie formula, will follow supplementation  guidelines per baby's age in hours  FOB present. All questions and concerns answered, family to contact [facility] services PRN.  Discharge Discharge Education: Engorgement and breast care;Warning signs for feeding baby;Outpatient recommendation Pump: Manual  Consult Status Consult Status: Complete Date: 09/26/23 Follow-up type: Call as needed   Bonnie Morales Constable 09/26/2023, 11:24 AM

## 2023-09-26 NOTE — Lactation Note (Addendum)
This note was copied from a baby's chart. Lactation Consultation Note  Patient Name: Bonnie Morales [id number] Date: 09/26/2023 Age:41 hours Reason for consult: Initial assessment;Early term 37-38.6wks Used Spanish interpreter Vicente Serene [phone]. Experienced BF mom BF her other children about 6-7 months each. Mom is BF/formula feeding until her mature milk comes in. Newborn feeding habits, STS, I&O reviewed. Mom encouraged to feed baby 8-12 times/24 hours and with feeding cues.  Mom used DEBP while in rm. Rt. Breast had glistening to flange. LC used gloved finger and put colostrum in baby's mouth. Mom stated she didn't have any questions or concerns. Encouraged to call for assistance as needed.  Maternal Data Has patient been taught Hand Expression?: Yes Does the patient have breastfeeding experience prior to this delivery?: Yes How long did the patient breastfeed?: 6-7 months each  Feeding    LATCH Score Latch: Too sleepy or reluctant, no latch achieved, no sucking elicited.  Audible Swallowing: None  Type of Nipple: Everted at rest and after stimulation  Comfort (Breast/Nipple): Soft / non-tender  Hold (Positioning): Assistance needed to correctly position infant at breast and maintain latch.  LATCH Score: 5   Lactation Tools Discussed/Used Tools: Pump;Flanges Flange Size: 21 Breast pump type: Double-Electric Breast Pump Reason for Pumping: stimulation  Interventions Interventions: Breast feeding basics reviewed;Assisted with latch;Skin to skin;Hand express;Breast compression;Adjust position;DEBP;Education;LC Services brochure  Discharge    Consult Status Consult Status: Follow-up Date: 09/26/23 Follow-up type: In-patient    Charyl Dancer 09/26/2023, 12:36 AM

## 2023-09-26 NOTE — Progress Notes (Signed)
Daily Postpartum Note  Admission Date: 09/25/2023 Current Date: 09/26/2023 2:08 PM  Bonnie Morales is a 42 y.o. [id number] PPD#1 VBAC/intact perineum at 2030, admitted for IOL for poorly controlled CHTN at 37wks. Patient diagnosed with superimposed severe pre-eclampsia based on HAs  Pregnancy complicated by: Patient Active Problem List   Diagnosis Date Noted   Chronic hypertension during pregnancy, antepartum 09/25/2023   SVD (spontaneous vaginal delivery) 08/08/2023   Fetal echogenic intracardiac focus on prenatal ultrasound 05/28/2023   Unwanted fertility 05/28/2023   Supervision of high risk pregnancy, antepartum 03/26/2023   AMA (advanced maternal age) multigravida 35+ 12/28/2021   Chronic hypertension affecting pregnancy 09/24/2017   Language barrier 06/17/2017   History of cesarean delivery 05/12/2015    Overnight/24hr events:  N/a  Subjective:  No s/s of pre-eclampsia.   Objective:    Current Vital Signs 24h Vital Sign Ranges  T 98 F (36.7 C) Temp  Avg: 98.1 F (36.7 C)  Min: 98 F (36.7 C)  Max: 98.3 F (36.8 C)  BP 137/63 BP  Min: 125/61  Max: 176/70  HR 62 Pulse  Avg: 71.8  Min: 59  Max: 99  RR 19 Resp  Avg: 17.7  Min: 16  Max: 19  SaO2 98 % Room Air SpO2  Avg: 99.1 %  Min: 98 %  Max: 100 %       24 Hour I/O Current Shift I/O  Time Ins Outs 11/13 0701 - 11/14 0700 In: 4524.6 [P.O.:2780; I.V.:1644.6] Out: 4156 [Urine:3900] 11/14 0701 - 11/14 1900 In: 1560 [P.O.:1560] Out: 1400 [Urine:1400]   Patient Vitals for the past 24 hrs:  BP Temp Temp src Pulse Resp SpO2 Height Weight  09/26/23 1109 137/63 -- -- 62 19 98 % -- --  09/26/23 0800 (!) 141/67 98 F (36.7 C) Oral 67 19 100 % -- --  09/26/23 0600 -- -- -- -- 17 -- -- --  09/26/23 0500 -- -- -- -- 18 -- -- --  09/26/23 0400 -- -- -- -- 18 -- -- --  09/26/23 0349 (!) 137/51 98 F (36.7 C) Oral 70 17 100 % -- --  09/26/23 0300 -- -- -- -- 16 -- -- --  09/26/23 0200 -- -- -- -- 17 -- -- --  09/26/23  0100 -- -- -- -- 18 -- -- --  09/25/23 2340 (!) 147/66 98.3 F (36.8 C) Oral 81 16 -- -- --  09/25/23 2230 138/75 -- -- 71 -- -- -- --  09/25/23 2222 (!) 140/70 98.2 F (36.8 C) Oral 72 19 100 % -- --  09/25/23 2220 (!) 167/76 -- -- 72 -- -- -- --  09/25/23 2145 (!) 145/78 -- -- 71 -- -- -- --  09/25/23 2130 (!) 158/78 -- -- 73 19 -- -- --  09/25/23 2115 (!) 151/78 -- -- 71 17 -- -- --  09/25/23 2100 139/70 -- -- 75 -- -- -- --  09/25/23 2049 (!) 140/72 -- -- 85 -- -- -- --  09/25/23 2045 126/63 -- -- 96 -- -- -- --  09/25/23 2030 (!) 127/98 -- -- 99 18 -- -- --  09/25/23 1930 (!) 145/77 -- -- 72 19 -- -- --  09/25/23 1914 (!) 148/80 -- -- 86 18 -- -- --  09/25/23 1830 (!) 148/68 -- -- (!) 59 -- -- -- --  09/25/23 1800 (!) 142/66 -- -- 65 18 -- -- --  09/25/23 1731 (!) 147/75 98 F (36.7 C) Oral 67 -- -- -- --  09/25/23 1700 134/62 -- -- 63 18 -- 5\' 5"  (1.651 m) 101.4 kg  09/25/23 1630 (!) 141/62 -- -- 67 -- 100 % -- --  09/25/23 1600 125/61 -- -- 65 16 99 % -- --  09/25/23 1556 -- -- -- -- 16 98 % -- --  09/25/23 1552 137/66 -- -- 79 18 98 % -- --  09/25/23 1531 (!) 176/70 -- -- 69 -- -- -- --  09/25/23 1501 (!) 160/76 -- -- 65 -- -- -- --  09/25/23 1500 (!) 160/76 -- -- 65 -- -- -- --  09/25/23 1430 (!) 161/65 98.2 F (36.8 C) Axillary 61 -- -- -- --  09/25/23 1421 (!) 157/70 -- -- 61 -- -- -- --    Physical exam: General: Well nourished, well developed female in no acute distress. Abdomen: nttp Cardiovascular: S1, S2 normal, no murmur, rub or gallop, regular rate and rhythm Respiratory: CTAB Extremities: no clubbing, cyanosis or edema Skin: Warm and dry.   Medications: Current Facility-Administered Medications  Medication Dose Route Frequency Provider Last Rate Last Admin   acetaminophen (TYLENOL) tablet 650 mg  650 mg Oral Q4H PRN Arabella Merles, CNM   650 mg at 09/26/23 0848   benzocaine-Menthol (DERMOPLAST) 20-0.5 % topical spray 1 Application  1 Application  Topical PRN Arabella Merles, CNM       coconut oil  1 Application Topical PRN Arabella Merles, CNM       witch hazel-glycerin (TUCKS) pad 1 Application  1 Application Topical PRN Arabella Merles, CNM       And   dibucaine (NUPERCAINAL) 1 % rectal ointment 1 Application  1 Application Rectal PRN Arabella Merles, CNM       diphenhydrAMINE (BENADRYL) capsule 25 mg  25 mg Oral Q6H PRN Arabella Merles, CNM       furosemide (LASIX) tablet 20 mg  20 mg Oral Daily Arabella Merles, CNM   20 mg at 09/26/23 0948   ibuprofen (ADVIL) tablet 600 mg  600 mg Oral Q6H Cam Hai D, CNM   600 mg at 09/26/23 1338   magnesium sulfate 40 grams in SWI 1000 mL OB infusion  2 g/hr Intravenous Titrated Clayton Bing, MD 50 mL/hr at 09/26/23 0843 2 g/hr at 09/26/23 0843   measles, mumps & rubella vaccine (MMR) injection 0.5 mL  0.5 mL Subcutaneous Once Arabella Merles, CNM       NIFEdipine (PROCARDIA XL/NIFEDICAL XL) 24 hr tablet 60 mg  60 mg Oral Daily Arabella Merles, CNM   60 mg at 09/26/23 0948   ondansetron (ZOFRAN) tablet 4 mg  4 mg Oral Q4H PRN Arabella Merles, CNM       Or   ondansetron [facility]) injection 4 mg  4 mg Intravenous Q4H PRN Cam Hai D, CNM       oxyCODONE (Oxy IR/ROXICODONE) immediate release tablet 5 mg  5 mg Oral Q4H PRN Arabella Merles, CNM       prenatal multivitamin tablet 1 tablet  1 tablet Oral Q1200 Arabella Merles, CNM   1 tablet at 09/26/23 1338   senna-docusate (Senokot-S) tablet 2 tablet  2 tablet Oral Q24H Cam Hai D, CNM   2 tablet at 09/25/23 2338   simethicone (MYLICON) chewable tablet 80 mg  80 mg Oral PRN Arabella Merles, CNM       Tdap Leda Min) injection 0.5 mL  0.5 mL Intramuscular Once Arabella Merles, CNM  zolpidem (AMBIEN) tablet 5 mg  5 mg Oral QHS PRN Arabella Merles, CNM        Labs:  Recent Labs  Lab 09/25/23 1242 09/26/23 0455  WBC 8.6 12.4*  HGB 11.7* 10.8*  HCT 35.7* 32.7*  PLT 258 256    Recent Labs  Lab 09/25/23 1242   NA 133*  K 4.5  CL 105  CO2 17*  BUN 7  CREATININE 0.45  CALCIUM 8.8*  PROT 6.6  BILITOT 0.4  ALKPHOS 132*  ALT 13  AST 26  GLUCOSE 74   Radiology:  none  Assessment & Plan:  Patient doing well *PP: routine care. O POS/rpr neg/boy, no circ/breast *Severe pre-eclampsia with CHTN: continue current regimen; pt was on norvasc 10 at home. Mg until tonight *PPx: SCDs *FEN/GI: regular diet *Dispo: likely tomorrow morning.   In person interpreter used  Cornelia Copa MD Attending Center for [facility]) GYN Consult Phone: [phone] (M-F, 0800-1700) & [phone]  (Off hours, weekends, holidays)

## 2023-09-27 ENCOUNTER — Other Ambulatory Visit (HOSPITAL_COMMUNITY): Payer: Self-pay

## 2023-09-27 LAB — BIRTH TISSUE RECOVERY COLLECTION (PLACENTA DONATION)

## 2023-09-27 MED ORDER — IBUPROFEN 600 MG PO TABS
600.0000 mg | ORAL_TABLET | Freq: Four times a day (QID) | ORAL | 0 refills | Status: AC | PRN
  Filled 2023-09-27: qty 30, 8d supply, fill #0

## 2023-09-27 MED ORDER — FUROSEMIDE 20 MG PO TABS
20.0000 mg | ORAL_TABLET | Freq: Every day | ORAL | 0 refills | Status: DC
  Filled 2023-09-27: qty 3, 3d supply, fill #0

## 2023-09-27 MED ORDER — ACETAMINOPHEN 325 MG PO TABS
650.0000 mg | ORAL_TABLET | Freq: Four times a day (QID) | ORAL | 0 refills | Status: AC | PRN
  Filled 2023-09-27: qty 30, 4d supply, fill #0

## 2023-09-27 MED ORDER — NIFEDIPINE ER 60 MG PO TB24
60.0000 mg | ORAL_TABLET | Freq: Every day | ORAL | 0 refills | Status: DC
  Filled 2023-09-27: qty 42, 42d supply, fill #0

## 2023-09-27 NOTE — Plan of Care (Signed)
  Problem: Education: Goal: Knowledge of General Education information will improve Description: Including pain rating scale, medication(s)/side effects and non-pharmacologic comfort measures Outcome: Progressing   Problem: Health Behavior/Discharge Planning: Goal: Ability to manage health-related needs will improve Outcome: Progressing   Problem: Clinical Measurements: Goal: Ability to maintain clinical measurements within normal limits will improve Outcome: Progressing Goal: Will remain free from infection Outcome: Progressing Goal: Diagnostic test results will improve Outcome: Progressing Goal: Respiratory complications will improve Outcome: Progressing Goal: Cardiovascular complication will be avoided Outcome: Progressing   Problem: Activity: Goal: Risk for activity intolerance will decrease Outcome: Progressing   Problem: Nutrition: Goal: Adequate nutrition will be maintained Outcome: Progressing   Problem: Coping: Goal: Level of anxiety will decrease Outcome: Progressing   Problem: Elimination: Goal: Will not experience complications related to bowel motility Outcome: Progressing Goal: Will not experience complications related to urinary retention Outcome: Progressing   Problem: Pain Management: Goal: General experience of comfort will improve Outcome: Progressing   Problem: Safety: Goal: Ability to remain free from injury will improve Outcome: Progressing   Problem: Skin Integrity: Goal: Risk for impaired skin integrity will decrease Outcome: Progressing   Problem: Education: Goal: Knowledge of condition will improve Outcome: Progressing Goal: Individualized Educational Video(s) Outcome: Progressing Goal: Individualized Newborn Educational Video(s) Outcome: Progressing   Problem: Activity: Goal: Will verbalize the importance of balancing activity with adequate rest periods Outcome: Progressing Goal: Ability to tolerate increased activity will  improve Outcome: Progressing   Problem: Coping: Goal: Ability to identify and utilize available resources and services will improve Outcome: Progressing   Problem: Life Cycle: Goal: Chance of risk for complications during the postpartum period will decrease Outcome: Progressing   Problem: Role Relationship: Goal: Ability to demonstrate positive interaction with newborn will improve Outcome: Progressing   Problem: Skin Integrity: Goal: Demonstration of wound healing without infection will improve Outcome: Progressing

## 2023-09-27 NOTE — Progress Notes (Signed)
Patient given discharge instructions and denies any questions or concerns. Patient off unit at this time.  

## 2023-10-02 ENCOUNTER — Ambulatory Visit: Payer: Self-pay

## 2023-10-02 ENCOUNTER — Ambulatory Visit: Payer: MEDICAID

## 2023-10-03 ENCOUNTER — Encounter: Payer: Self-pay | Admitting: Obstetrics and Gynecology

## 2023-10-03 ENCOUNTER — Telehealth: Payer: Self-pay | Admitting: Family Medicine

## 2023-10-03 NOTE — Telephone Encounter (Signed)
Patient called she has a high blood pressure and its 148/81 but sometimes its 160/83.Marland Kitchen would like a nurse to call back

## 2023-10-03 NOTE — Telephone Encounter (Signed)
Called patient at number listed in chart using [employer] ID# [phone] to unidentified VM. Left message for patient to call office back for concerns.   Maureen Ralphs RN on 10/03/23 at [phone]

## 2023-10-04 ENCOUNTER — Inpatient Hospital Stay (HOSPITAL_COMMUNITY)
Admission: AD | Admit: 2023-10-04 | Discharge: 2023-10-04 | Disposition: A | Discharge status: Home / Self Care | Payer: Medicaid Other | Attending: Obstetrics and Gynecology | Admitting: Obstetrics and Gynecology

## 2023-10-04 ENCOUNTER — Encounter (HOSPITAL_COMMUNITY): Payer: Self-pay | Admitting: Obstetrics and Gynecology

## 2023-10-04 DIAGNOSIS — O1003 Pre-existing essential hypertension complicating the puerperium: Secondary | ICD-10-CM | POA: Diagnosis not present

## 2023-10-04 DIAGNOSIS — O906 Postpartum mood disturbance: Secondary | ICD-10-CM | POA: Diagnosis not present

## 2023-10-04 DIAGNOSIS — R519 Headache, unspecified: Secondary | ICD-10-CM | POA: Diagnosis present

## 2023-10-04 DIAGNOSIS — I10 Essential (primary) hypertension: Secondary | ICD-10-CM | POA: Diagnosis not present

## 2023-10-04 LAB — COMPREHENSIVE METABOLIC PANEL
ALT: 25 U/L (ref 0–44)
AST: 22 U/L (ref 15–41)
Albumin: 3.2 g/dL — ABNORMAL LOW (ref 3.5–5.0)
Alkaline Phosphatase: 80 U/L (ref 38–126)
Anion gap: 9 (ref 5–15)
BUN: 11 mg/dL (ref 6–20)
CO2: 23 mmol/L (ref 22–32)
Calcium: 9.1 mg/dL (ref 8.9–10.3)
Chloride: 104 mmol/L (ref 98–111)
Creatinine, Ser: 0.46 mg/dL (ref 0.44–1.00)
GFR, Estimated: >60 mL/min (ref >60)
Glucose, Bld: 97 mg/dL (ref 70–99)
Potassium: 3.5 mmol/L (ref 3.5–5.1)
Sodium: 136 mmol/L (ref 135–145)
Total Bilirubin: 0.3 mg/dL (ref <1.2)
Total Protein: 7.2 g/dL (ref 6.5–8.1)

## 2023-10-04 LAB — CBC
HCT: 35.5 % — ABNORMAL LOW (ref 36.0–46.0)
Hemoglobin: 11.7 g/dL — ABNORMAL LOW (ref 12.0–15.0)
MCH: 27.1 pg (ref 26.0–34.0)
MCHC: 33 g/dL (ref 30.0–36.0)
MCV: 82.4 fL (ref 80.0–100.0)
Platelets: 462 10*3/uL — ABNORMAL HIGH (ref 150–400)
RBC: 4.31 MIL/uL (ref 3.87–5.11)
RDW: 16.1 % — ABNORMAL HIGH (ref 11.5–15.5)
WBC: 7.9 10*3/uL (ref 4.0–10.5)
nRBC: 0 % (ref 0.0–0.2)

## 2023-10-04 MED ORDER — FUROSEMIDE 20 MG PO TABS: 20.0000 mg | ORAL_TABLET | Freq: Every day | ORAL | 0 refills | Status: DC

## 2023-10-04 MED ORDER — AMLODIPINE BESYLATE 10 MG PO TABS: 10.0000 mg | ORAL_TABLET | Freq: Every day | ORAL | 0 refills | Status: DC

## 2023-10-04 NOTE — MAU Provider Note (Addendum)
History     CSN: [id number]  Arrival date and time: 10/04/23 1308   Event Date/Time   First Provider Initiated Contact with Patient    Chief Complaint  Patient presents with   Hypertension   Headache    HPI  Bonnie Morales is a 42 y.o. G5P4105 at 9 days post delivery who presents to the MAU for blood pressure changes and palpitations.  Patient is s/p SVD at [redacted]w[redacted]d following IOL due to SIPE with SF, requiring magnesium and labetalol. She was discharged on Procardia 60mg  and Lasix. She has been checking her BP and it has been ranging in the 120s/70s to 170s (systolic). Her high readings are when she checks BP immediately after walking around/exertion. She feels very anxious about her elevated BP, notices BP more elevated after exertion or with increased anxiety. Also noticing elevated HR and associated palpitations. Denies pain. Lochia minimal. She has been drinking well but eating less because she is worried it will increase her blood pressure. She is breastfeeding.   Past Medical History:  Diagnosis Date   AMA (advanced maternal age) multigravida 35+ 12/28/2021   Chronic hypertension    Gestational diabetes    History of cervical incompetence (cerclage in G3 and G4) 09/24/2017   Had cerclage, removed at 37 week visit  Current pregnancy:  Declined cerclage (see MFM note), getting serial CL scans     history of gestational diabetes 07/06/2021   Early 2  hr GTT normal     History of kidney stones    passed stone   History of VBAC 04/09/2017   c-section in [place]  VBAC x 1     Consent signed 08/01/2017   Hypertension    2014   Language barrier 06/17/2017   Pre-eclampsia     Past Surgical History:  Procedure Laterality Date   CERVICAL CERCLAGE N/A 04/12/2017   Procedure: CERCLAGE CERVICAL;  Surgeon: Hermina Staggers, MD;  Location: [facility]S;  Service: Gynecology;  Laterality: N/A;   CERVICAL CERCLAGE N/A 07/25/2021   Procedure: CERCLAGE CERVICAL;  Surgeon: Hermina Staggers, MD;  Location: [facility] LD ORS;  Service: Gynecology;  Laterality: N/A;   CESAREAN SECTION     epidural - in [place]    Family History  Problem Relation Age of Onset   Diabetes Mother    Hypertension Mother    Heart disease Mother        tachycardia   Hypertension Father    Mental retardation Sister    Cancer Paternal Grandmother     Social History   Tobacco Use   Smoking status: Former    Current packs/day: 0.00    Average packs/day: 0.3 packs/day for 5.0 years (1.3 ttl pk-yrs)    Types: Cigarettes    Start date: 09/13/2011    Quit date: 09/12/2016    Years since quitting: 7.0   Smokeless tobacco: Never  Vaping Use   Vaping status: Never Used  Substance Use Topics   Alcohol use: No   Drug use: No    Allergies:  Allergies  Allergen Reactions   Latex     burning   Sulfa Antibiotics Hives    Reports high fever and red rash on arms    Medications Prior to Admission  Medication Sig Dispense Refill Last Dose   NIFEdipine (ADALAT CC) 60 MG 24 hr tablet Take 1 tablet (60 mg total) by mouth daily. 42 tablet 0 10/03/2023   acetaminophen (TYLENOL) 325 MG tablet Take 2  tablets (650 mg total) by mouth every 6 (six) hours as needed (for pain scale < 4). 30 tablet 0    ibuprofen (ADVIL) 600 MG tablet Take 1 tablet (600 mg total) by mouth every 6 (six) hours as needed. 30 tablet 0    Prenatal Vit-Fe Fumarate-FA (PRENATAL MULTIVITAMIN) TABS tablet Take 1 tablet by mouth daily.      [DISCONTINUED] furosemide (LASIX) 20 MG tablet Take 1 tablet (20 mg total) by mouth daily for 3 days. 3 tablet 0     ROS reviewed and pertinent positives and negatives as documented in HPI.  Physical Exam   Blood pressure 125/72, pulse 69, temperature 98.6 F (37 C), temperature source Oral, resp. rate 16, height 5\' 5"  (1.651 m), weight 92.3 kg, SpO2 98%, unknown if currently breastfeeding.  Physical Exam  General: anxious appearing female Cardio: normal S1 S2 no rubs, gallops, murmurs Pulm:  normal work of breathing, no crackles, wheezes MSK: no pedal edema  MAU Course  Procedures  MDM 42 y.o. O1H0865 at 9 days post partum presenting for elevated BP in setting of previous SIPE with SF for which IOL was performed with subsequent SVD 11/13. Received magnesium during IOL and PP x 24h. Discharged on Nifedipine. Having some higher readings at home, but also is worried about hypotension and tachycardia. Does not appear to be hypovolemic causing hypotension/tachycardia and BP here have been normotensive to mild range. No SF, PEC w/up reassuring. Discussed at length options for cHTN tx, decided on extending course of Lasix, resuming Amlodipine 10mg  that she was on throughout pregnancy. Message sent to clinic to schedule BP f/up.  Assessment and Plan   Chronic hypertension - Plan: Discharge patient - STOP Procardia - START Amlodipine - Rx sent to extend course of Lasix - Home BP monitoring - Return precautions discussed  2. Postpartum blues: Reporting more tearfulness/anxiety. Does not feel like medication management or therapy is warranted at this time. Does not endorse any thoughts of harming self, others, or baby. - Cont to monitor closely for signs of worsening of symptoms   Laurier Nancy, MS3 10/04/2023, 3:29 PM     Attestation of Supervision of Student:  I confirm that I have verified the information documented in the medical student's note and that I have also personally performed the history, physical exam and all medical decision making activities.  I have verified that all services and findings are accurately documented in this student's note; and I agree with management and plan as outlined in the documentation. I have also made any necessary editorial changes.  Sundra Aland, MD Center for [facility], [facility] 10/04/2023 9:24 PM

## 2023-10-04 NOTE — MAU Note (Signed)
...  Bonnie Morales Radene Journey is a 42 y.o. at Unknown here in MAU reporting: PP val delivery on 11/13. She reports she went to the clinic for a blood pressure check but they were closed. She reports a current HA that began two days ago. She reports her HA's occur with stress and when her blood pressures are increased. She reports her BP this morning was 131/82. Has not taken her Adalat yet today, she reports she usually takes it when she is fasting at 0900 in the mornings. Reports increased blurred vision in her right eye that increased after delivery. She reports her right eye is usually blurry. Denies RUQ/epigastric.  Has only had hot chocolate.  Last took Tylenol yesterday around 1600.  Completed her lasix.  Patient was induced due to superimposed preeclampsia with SF. Patient received 24h of magnesium and labetalol during her hospital course.  Onset of complaint: x2 days Pain score: 4/10 HA  Vitals:   10/04/23 1342  BP: (!) 140/75  Pulse: 89  Resp: 16  Temp: 98.6 F (37 C)  SpO2: 98%     Lab orders placed from triage: none

## 2023-10-07 ENCOUNTER — Inpatient Hospital Stay (HOSPITAL_COMMUNITY): Payer: Medicaid Other

## 2023-10-07 NOTE — Telephone Encounter (Signed)
Patient came into front office after hours on 10/04/23. Spoke with Crissie Reese MD who recommended she present to MAU for eval due to multiple elevated BP at home.

## 2023-10-07 NOTE — Progress Notes (Unsigned)
Blood Pressure Check Visit  Bonnie Morales is here for blood pressure check following spontaneous vaginal birth on 09/25/23. BP today is 125/67. Patient denies any dizziness, blurred/spotty vision, headache, and shortness of breath--was experiencing some blurry vision in one eye along with intermittent headache and some swelling in legs, however these symptoms have subsided since switch to amlodipine on Friday 10/04/23.   Patient diagnosed with [facility]; currently taking amlodipine 10mg  daily PO--had gone into MAU on Friday 10/04/23 and was switched to amlodipine from nifedipine d/t tachycardia from nifedipine per patient.  Reviewed with Lakeview Bing MD; per provider, patient is to continue taking amlodipine as prescribed and we will evaluate/follow up at [facility] visit. PP visit scheduled for 10/24/23 at 1455 with Lamont Snowball CNM. Patient verbalized understanding and had no other questions or concerns.   Meryl Crutch, RN 10/08/23 at [phone]

## 2023-10-08 ENCOUNTER — Ambulatory Visit (INDEPENDENT_AMBULATORY_CARE_PROVIDER_SITE_OTHER): Payer: Medicaid Other

## 2023-10-08 ENCOUNTER — Other Ambulatory Visit: Payer: Self-pay

## 2023-10-08 ENCOUNTER — Encounter: Payer: Self-pay | Admitting: *Deleted

## 2023-10-08 VITALS — BP 125/67 | HR 82 | Ht 65.0 in | Wt 201.2 lb

## 2023-10-08 DIAGNOSIS — Z0130 Encounter for examination of blood pressure without abnormal findings: Secondary | ICD-10-CM

## 2023-10-24 ENCOUNTER — Ambulatory Visit: Payer: Self-pay | Admitting: Certified Nurse Midwife

## 2023-10-24 ENCOUNTER — Other Ambulatory Visit: Payer: Self-pay

## 2023-10-24 DIAGNOSIS — F32A Depression, unspecified: Secondary | ICD-10-CM

## 2023-10-24 DIAGNOSIS — Z603 Acculturation difficulty: Secondary | ICD-10-CM

## 2023-10-24 DIAGNOSIS — I10 Essential (primary) hypertension: Secondary | ICD-10-CM

## 2023-10-24 DIAGNOSIS — Z758 Other problems related to medical facilities and other health care: Secondary | ICD-10-CM

## 2023-10-24 NOTE — Progress Notes (Signed)
Post Partum Visit Note  Bonnie Morales is a 42 y.o. G44P4105 female who presents for a postpartum visit. She is 4 weeks postpartum following a normal spontaneous vaginal delivery.  I have fully reviewed the prenatal and intrapartum course. The delivery was at 37/2 gestational weeks.  Anesthesia: IV sedation. Postpartum course has been affected by hypertension, currently controlled with Amlodipine, reports she had a traumatic birth, and is tearful. Patient states that she and her mother had a very hard time understanding what was happening at the birth, and her mother thought she was dying. They report that the  communication regarding medications and medication changes as well as other interventions was lacking and this caused them to have a very poor experience.   Baby is doing well. Baby is feeding by both breast and bottle - [employer] Good Start. Bleeding staining only. Bowel function is normal. Bladder function is normal. Patient is not sexually active. Contraception method is none. Postpartum depression screening: positive.   The pregnancy intention screening data noted above was reviewed. Potential methods of contraception were discussed. The patient elected to proceed with No data recorded.   Edinburgh Postnatal Depression Scale - 10/24/23 1530       Edinburgh Postnatal Depression Scale:  In the Past 7 Days   I have been able to laugh and see the funny side of things. 2    I have looked forward with enjoyment to things. 0    I have blamed myself unnecessarily when things went wrong. 0    I have been anxious or worried for no good reason. 2    I have felt scared or panicky for no good reason. 1    Things have been getting on top of me. 2    I have been so unhappy that I have had difficulty sleeping. 0    I have felt sad or miserable. 1    I have been so unhappy that I have been crying. 2    The thought of harming myself has occurred to me. 0    Edinburgh Postnatal  Depression Scale Total 10             Health Maintenance Due  Topic Date Due   COVID-19 Vaccine (3 - 2024-25 season) 07/14/2023    The following portions of the patient's history were reviewed and updated as appropriate: allergies, current medications, past family history, past medical history, past social history, past surgical history, and problem list.  Review of Systems Pertinent items are noted in HPI.  Objective:  BP (!) 153/84   Pulse 64   Ht 5\' 5"  (1.651 m)   Wt 205 lb (93 kg)   LMP 01/07/2023   BMI 34.11 kg/m    General:  alert, cooperative, and appears stated age   Breasts:  deferred  Lungs: clear to auscultation bilaterally  Heart:  regular rate and rhythm, S1, S2 normal, no murmur, click, rub or gallop  Abdomen: soft, non-tender; bowel sounds normal; no masses,  no organomegaly   Wound  Not indicated  GU exam:  not indicated       Assessment:    1. Encounter for routine postpartum follow-up - Patient reports that she had a traumatic birth and experience at the hospital, as described above - Counseled that referral to [facility] was encouraged, patient accepted and ordered. - Counseled patient on trauma, counseled that it is not her fault and that it is normal to have complicated feelings regarding her birth  and her baby due to trauma.   2. Language barrier - In person interpreter (Eda) used.   3. Chronic hypertension - Patient reports that BPs normal on home cuff (wrist cuff). Arm cuff provided today with patient to record at least 2 BP readings per day or more often with HTN symptoms.  Patient very tearful and emotional today, suspect elevated reading 2/2 emotional state. - Counseled on strict precautions and when to return.  - Blood pressure check in one week here.  - Amlodipine 10mg  prescribed previously, patient reports she is taking as prescribed..    Medically benign postpartum exam.   Plan:   Essential components of care per ACOG  recommendations:  1.  Mood and well being: Patient with positive depression screening today. Reviewed local resources for support. Referral to [facility] support ordered.  - Patient tobacco use? No.   - hx of drug use? No.    2. Infant care and feeding:  -Patient currently breastmilk feeding? Yes. Discussed returning to work and pumping. Reviewed importance of draining breast regularly to support lactation.  -Social determinants of health (SDOH) reviewed in EPIC.The following needs were identified: Behavioral health, [facility] for IUD placement.   3. Sexuality, contraception and birth spacing - Patient does not know want a pregnancy in the next year.  Desired family size is 5 children.  - Reviewed reproductive life planning. Reviewed contraceptive methods based on pt preferences and effectiveness.  Patient desired IUD or IUS today.   - Discussed birth spacing of 18 months  4. Sleep and fatigue -Encouraged family/partner/community support of 4 hrs of uninterrupted sleep to help with mood and fatigue  5. Physical Recovery  - Discussed patients delivery and complications. She describes her labor as bad. - Patient had a vaginal delivery with no problems Patient had no laceration. Perineal healing reviewed. Patient expressed understanding - Patient has urinary incontinence? No. - Patient is safe to resume physical and sexual activity  6.  Health Maintenance - HM due items addressed Yes - Last pap smear  Diagnosis  Date Value Ref Range Status  04/04/2023   Final   - Negative for intraepithelial lesion or malignancy (NILM)   Pap smear not done at today's visit.  -Breast Cancer screening indicated? No.   7. Chronic Disease/Pregnancy Condition follow up: Hypertension  - 1 week PP follow up with RN for BP check due to self-pay status.   Richardson Landry, CNM Center for [employer], [facility]

## 2023-11-01 ENCOUNTER — Other Ambulatory Visit: Payer: Self-pay

## 2023-11-01 ENCOUNTER — Ambulatory Visit: Payer: Self-pay

## 2023-11-01 VITALS — BP 138/68 | HR 75 | Ht 65.0 in | Wt 203.0 lb

## 2023-11-01 DIAGNOSIS — Z0130 Encounter for examination of blood pressure without abnormal findings: Secondary | ICD-10-CM

## 2023-11-01 NOTE — Progress Notes (Signed)
Blood Pressure Check Visit  Bonnie Morales is here for blood pressure check due to elevated BP during PP visit. following spontaneous vaginal birth on 09/25/23. BP today is 138/68. Patient denies any visual disturbances and/or headaches. Patient does not have a PCP. Instructed patient how to schedule a new patient PCP appointment through [facility]. Patient verbalized understanding. Patient denies any further needs at this time.  Quintella Reichert, RN 11/01/2023  11:12 AM

## 2023-11-11 ENCOUNTER — Telehealth: Payer: Self-pay | Admitting: Clinical

## 2023-11-11 NOTE — Telephone Encounter (Signed)
Attempt call regarding referral; Left HIPPA-compliant message to call back Asher Muir from [employer] for [employer] at [facility] at  [phone] [facility] office), via Spanish interpreter, [place], [phone]

## 2023-11-22 ENCOUNTER — Encounter: Payer: Self-pay | Admitting: Family

## 2023-11-26 NOTE — Progress Notes (Signed)
   This encounter was created in error - please disregard. No show

## 2023-11-27 ENCOUNTER — Encounter: Payer: Self-pay | Admitting: Family

## 2023-11-27 ENCOUNTER — Ambulatory Visit (INDEPENDENT_AMBULATORY_CARE_PROVIDER_SITE_OTHER): Payer: Self-pay | Admitting: Family

## 2023-11-27 VITALS — BP 170/110 | HR 60 | Temp 97.6°F | Resp 18 | Ht 65.0 in | Wt 201.2 lb

## 2023-11-27 DIAGNOSIS — I10 Essential (primary) hypertension: Secondary | ICD-10-CM

## 2023-11-27 DIAGNOSIS — Z13220 Encounter for screening for lipoid disorders: Secondary | ICD-10-CM

## 2023-11-27 DIAGNOSIS — Z7689 Persons encountering health services in other specified circumstances: Secondary | ICD-10-CM

## 2023-11-27 DIAGNOSIS — R1013 Epigastric pain: Secondary | ICD-10-CM

## 2023-11-27 MED ORDER — AMLODIPINE BESYLATE 10 MG PO TABS: 10.0000 mg | ORAL_TABLET | Freq: Every day | ORAL | 1 refills | Status: DC

## 2023-11-27 MED ORDER — METOPROLOL TARTRATE 25 MG PO TABS: 12.5000 mg | ORAL_TABLET | Freq: Two times a day (BID) | ORAL | 3 refills | Status: DC

## 2023-11-27 NOTE — Patient Instructions (Signed)
-   check Blood pressure at home and record on log provided and notify provider if B/p > 140/90 or less than 100/60

## 2023-11-27 NOTE — Progress Notes (Signed)
 Provider: Christean Courts FNP-C   Madeeha Costantino, Elijio Guadeloupe, NP  Patient Care Team: Tyreesha Maharaj, Elijio Guadeloupe, NP as PCP - General (Family Medicine)  Extended Emergency Contact Information Primary Emergency Contact: Santiago,Luis Address: [address]           [place], [place] [address] [place] Home Phone: [phone] Relation: Significant other Secondary Emergency Contact: Arnold Bicker, [place] Home Phone: [phone] Relation: Friend  Code Status:  Full Code  Goals of care: Advanced Directive information    11/27/2023    9:32 AM  Advanced Directives  Does Patient Have a Medical Advance Directive? No  Would patient like information on creating a medical advance directive? No - Patient declined     Chief Complaint  Patient presents with   Establish Care    New patient appointment and blood pressures issues and swelling.    HPI:  Pt is a 43 y.o. female seen today establish care here at [facility] for medical management of chronic diseases.HPI information obtain with assist from [facility] Spanish interpreter.Has a medical history of Essential Hypertension and Obesity.status post delivery 2 months ago.   Hypertension - B/p 140's/70's-80's sometimes ha headache bit not daily. Sometimes has blurry vision was worst during pregnancy. Takes Amlodipine  and baby ASA.she denies any ,dizziness,fatigue,chest tightness,palpitation,chest pain or shortness of breath.     Epigastric intermittent which comes and goes.symptoms do not worsen with spicy or other acidic foods but tends to worsen when she eats.  Immunization - up to date except due for COVid-19 vaccine. aware to get vaccine at the pharmacy.     Past Medical History:  Diagnosis Date   AMA (advanced maternal age) multigravida 35+ 12/28/2021   Chronic hypertension    Gestational diabetes    History of cervical incompetence (cerclage in G3 and G4)  09/24/2017   Had cerclage, removed at 37 week visit  Current pregnancy:  Declined cerclage (see MFM note), getting serial CL scans     history of gestational diabetes 07/06/2021   Early 2  hr GTT normal     History of kidney stones    passed stone   History of VBAC 04/09/2017   c-section in [place]  VBAC x 1     Consent signed 08/01/2017   Hypertension    2014   Language barrier 06/17/2017   Pre-eclampsia    Past Surgical History:  Procedure Laterality Date   CERVICAL CERCLAGE N/A 04/12/2017   Procedure: CERCLAGE CERVICAL;  Surgeon: Othelia Blinks, MD;  Location: [facility] ORS;  Service: Gynecology;  Laterality: N/A;   CERVICAL CERCLAGE N/A 07/25/2021   Procedure: CERCLAGE CERVICAL;  Surgeon: Othelia Blinks, MD;  Location: [facility] LD ORS;  Service: Gynecology;  Laterality: N/A;   CESAREAN SECTION     epidural - in [place]    Allergies  Allergen Reactions   Latex     burning   Sulfa Antibiotics Hives    Reports high fever and red rash on arms    Allergies as of 11/27/2023       Reactions   Latex    burning   Sulfa Antibiotics Hives   Reports high fever and red rash on arms        Medication List        Accurate as of November 27, 2023  9:49 AM. If you have any questions, ask your nurse or doctor.  acetaminophen  325 MG tablet Commonly known as: Tylenol  Take 2 tablets (650 mg total) by mouth every 6 (six) hours as needed (for pain scale < 4).   amLODipine  10 MG tablet Commonly known as: NORVASC  Take 1 tablet (10 mg total) by mouth daily.   ibuprofen  600 MG tablet Commonly known as: ADVIL  Take 1 tablet (600 mg total) by mouth every 6 (six) hours as needed.   prenatal multivitamin Tabs tablet Take 1 tablet by mouth daily.        Review of Systems  Constitutional:  Negative for appetite change, chills, fatigue, fever and unexpected weight change.  HENT:  Negative for congestion, dental problem, ear discharge, ear pain, facial swelling, hearing loss,  nosebleeds, postnasal drip, rhinorrhea, sinus pressure, sinus pain, sneezing, sore throat, tinnitus and trouble swallowing.   Eyes:  Negative for pain, discharge, redness, itching and visual disturbance.  Respiratory:  Negative for cough, chest tightness, shortness of breath and wheezing.   Cardiovascular:  Negative for chest pain, palpitations and leg swelling.  Gastrointestinal:  Negative for abdominal distention, abdominal pain, blood in stool, constipation, diarrhea, nausea and vomiting.  Endocrine: Negative for cold intolerance, heat intolerance, polydipsia, polyphagia and polyuria.  Genitourinary:  Negative for difficulty urinating, dysuria, flank pain, frequency and urgency.  Musculoskeletal:  Negative for arthralgias, back pain, gait problem, joint swelling, myalgias, neck pain and neck stiffness.  Skin:  Negative for color change, pallor, rash and wound.  Neurological:  Negative for dizziness, syncope, speech difficulty, weakness, light-headedness, numbness and headaches.  Hematological:  Does not bruise/bleed easily.  Psychiatric/Behavioral:  Negative for agitation, behavioral problems, confusion, hallucinations, self-injury, sleep disturbance and suicidal ideas. The patient is not nervous/anxious.     Immunization History  Administered Date(s) Administered   Influenza, Seasonal, Injecte, Preservative Fre 07/22/2023   Influenza,inj,Quad PF,6+ Mos 08/01/2017, 07/19/2021   Influenza-Unspecified 02/11/2017   PFIZER(Purple Top)SARS-COV-2 Vaccination 02/12/2020, 03/04/2020   Tdap 06/02/2015, 06/17/2017, 07/22/2023   Pertinent  Health Maintenance Due  Topic Date Due   INFLUENZA VACCINE  Completed      12/28/2021   11:06 PM 12/29/2021    7:31 AM 12/29/2021    8:47 PM 12/30/2021    9:00 AM 11/27/2023    9:31 AM  Fall Risk  Falls in the past year?     0  Was there an injury with Fall?     0  Fall Risk Category Calculator     0  (RETIRED) Patient Fall Risk Level Low fall risk Low fall  risk Low fall risk Low fall risk    Functional Status Survey:    Vitals:   11/27/23 0934 11/27/23 0947  BP: (!) 140/100 (!) 170/110  Pulse: 60   Resp: 18   Temp: 97.6 F (36.4 C)   SpO2: 99%   Weight: 201 lb 3.2 oz (91.3 kg)   Height: 5\' 5"  (1.651 m)    Body mass index is 33.48 kg/m. Physical Exam Vitals reviewed.  Constitutional:      General: She is not in acute distress.    Appearance: Normal appearance. She is obese. She is not ill-appearing or diaphoretic.  HENT:     Head: Normocephalic.     Right Ear: Tympanic membrane, ear canal and external ear normal. There is no impacted cerumen.     Left Ear: Tympanic membrane, ear canal and external ear normal. There is no impacted cerumen.     Nose: Nose normal. No congestion or rhinorrhea.     Mouth/Throat:  Mouth: Mucous membranes are moist.     Pharynx: Oropharynx is clear. No oropharyngeal exudate or posterior oropharyngeal erythema.  Eyes:     General: No scleral icterus.       Right eye: No discharge.        Left eye: No discharge.     Extraocular Movements: Extraocular movements intact.     Conjunctiva/sclera: Conjunctivae normal.     Pupils: Pupils are equal, round, and reactive to light.  Neck:     Vascular: No carotid bruit.  Cardiovascular:     Rate and Rhythm: Normal rate and regular rhythm.     Pulses: Normal pulses.     Heart sounds: Normal heart sounds. No murmur heard.    No friction rub. No gallop.  Pulmonary:     Effort: Pulmonary effort is normal. No respiratory distress.     Breath sounds: Normal breath sounds. No wheezing, rhonchi or rales.  Chest:     Chest wall: No tenderness.  Abdominal:     General: Bowel sounds are normal. There is no distension.     Palpations: Abdomen is soft. There is no mass.     Tenderness: There is no abdominal tenderness. There is no right CVA tenderness, left CVA tenderness, guarding or rebound.  Musculoskeletal:        General: No swelling or tenderness. Normal  range of motion.     Cervical back: Normal range of motion. No rigidity or tenderness.     Right lower leg: No edema.     Left lower leg: No edema.  Lymphadenopathy:     Cervical: No cervical adenopathy.  Skin:    General: Skin is warm and dry.     Coloration: Skin is not pale.     Findings: No bruising, erythema, lesion or rash.  Neurological:     Mental Status: She is alert and oriented to person, place, and time.     Cranial Nerves: No cranial nerve deficit.     Sensory: No sensory deficit.     Motor: No weakness.     Coordination: Coordination normal.     Gait: Gait normal.  Psychiatric:        Mood and Affect: Mood normal.        Speech: Speech normal.        Behavior: Behavior normal.        Thought Content: Thought content normal.        Judgment: Judgment normal.     Labs reviewed: Recent Labs    04/04/23 1237 09/25/23 1242 10/04/23 1343  NA 136 133* 136  K 3.7 4.5 3.5  CL 102 105 104  CO2 21 17* 23  GLUCOSE 108* 74 97  BUN 7 7 11   CREATININE 0.43* 0.45 0.46  CALCIUM 9.2 8.8* 9.1   Recent Labs    04/04/23 1237 09/25/23 1242 10/04/23 1343  AST 13 26 22   ALT 10 13 25   ALKPHOS 77 132* 80  BILITOT 0.2 0.4 0.3  PROT 7.3 6.6 7.2  ALBUMIN 4.0 2.7* 3.2*   Recent Labs    04/04/23 1237 07/22/23 0844 09/25/23 1242 09/26/23 0455 10/04/23 1343  WBC 7.8   < > 8.6 12.4* 7.9  NEUTROABS 5.5  --   --   --   --   HGB 12.5   < > 11.7* 10.8* 11.7*  HCT 37.7   < > 35.7* 32.7* 35.5*  MCV 84   < > 81.0 81.1 82.4  PLT 349   < >  258 256 462*   < > = values in this interval not displayed.   No results found for: "TSH" Lab Results  Component Value Date   HGBA1C 5.9 (H) 04/04/2023   No results found for: "CHOL", "HDL", "LDLCALC", "LDLDIRECT", "TRIG", "CHOLHDL"  Significant Diagnostic Results in last 30 days:  No results found.  Assessment/Plan 1. Uncontrolled hypertension (Primary) B/p elevated this visit though B/p at home in the 140's/70's-80's.will add  metoprolol  as below.SE discussed  - Advised to check Blood pressure at home and record on log provided and notify provider if B/p > 140/90  - metoprolol  tartrate (LOPRESSOR ) 25 MG tablet; Take 0.5 tablets (12.5 mg total) by mouth 2 (two) times daily.  Dispense: 180 tablet; Refill: 3 - Lipid panel - TSH - COMPLETE METABOLIC PANEL WITH GFR - CBC with Differential/Platelet  2. Encounter to establish care Immunization up to date except due for COVID-19 vaccine.made aware to get vaccine at the Pharmacy.Recommend fasting labs.   3. Screening for hyperlipidemia Dietary modification and exercise at least 30 minutes three times per week.  - Lipid panel  4. Epigastric pain Slight Tender to palpation will r/o H.Pylori.  - H. pylori breath test; Future   Family/ staff Communication: Reviewed plan of care with patient verbalized understanding via Interpreter.   Labs/tests ordered:  - Lipid panel - TSH - COMPLETE METABOLIC PANEL WITH GFR - CBC with Differential/Platelet - H. pylori breath test; Future  Next Appointment : Return in about 2 weeks (around 12/11/2023) for Blood pressure.   Estil Heman, NP

## 2023-11-28 LAB — CBC WITH DIFFERENTIAL/PLATELET
Absolute Lymphocytes: 1433 {cells}/uL (ref 850–3900)
Absolute Monocytes: 413 {cells}/uL (ref 200–950)
Basophils Absolute: 20 {cells}/uL (ref 0–200)
Basophils Relative: 0.4 %
Eosinophils Absolute: 143 {cells}/uL (ref 15–500)
Eosinophils Relative: 2.8 %
HCT: 39.4 % (ref 35.0–45.0)
Hemoglobin: 12.7 g/dL (ref 11.7–15.5)
MCH: 27.1 pg (ref 27.0–33.0)
MCHC: 32.2 g/dL (ref 32.0–36.0)
MCV: 84 fL (ref 80.0–100.0)
MPV: 9.8 fL (ref 7.5–12.5)
Monocytes Relative: 8.1 %
Neutro Abs: 3091 {cells}/uL (ref 1500–7800)
Neutrophils Relative %: 60.6 %
Platelets: 351 10*3/uL (ref 140–400)
RBC: 4.69 10*6/uL (ref 3.80–5.10)
RDW: 15 % (ref 11.0–15.0)
Total Lymphocyte: 28.1 %
WBC: 5.1 10*3/uL (ref 3.8–10.8)

## 2023-11-28 LAB — LIPID PANEL
Cholesterol: 216 mg/dL — ABNORMAL HIGH (ref <200)
HDL: 72 mg/dL (ref >=50)
LDL Cholesterol (Calc): 123 mg/dL — ABNORMAL HIGH
Non-HDL Cholesterol (Calc): 144 mg/dL — ABNORMAL HIGH (ref <130)
Total CHOL/HDL Ratio: 3 (calc) (ref <5.0)
Triglycerides: 105 mg/dL (ref <150)

## 2023-11-28 LAB — COMPLETE METABOLIC PANEL WITH GFR
AG Ratio: 1.3 (calc) (ref 1.0–2.5)
ALT: 18 U/L (ref 6–29)
AST: 23 U/L (ref 10–30)
Albumin: 4.3 g/dL (ref 3.6–5.1)
Alkaline phosphatase (APISO): 99 U/L (ref 31–125)
BUN/Creatinine Ratio: 28 (calc) — ABNORMAL HIGH (ref 6–22)
BUN: 12 mg/dL (ref 7–25)
CO2: 28 mmol/L (ref 20–32)
Calcium: 9.4 mg/dL (ref 8.6–10.2)
Chloride: 104 mmol/L (ref 98–110)
Creat: 0.43 mg/dL — ABNORMAL LOW (ref 0.50–0.99)
Globulin: 3.3 g/dL (ref 1.9–3.7)
Glucose, Bld: 97 mg/dL (ref 65–99)
Potassium: 4 mmol/L (ref 3.5–5.3)
Sodium: 139 mmol/L (ref 135–146)
Total Bilirubin: 0.3 mg/dL (ref 0.2–1.2)
Total Protein: 7.6 g/dL (ref 6.1–8.1)
eGFR: 125 mL/min/{1.73_m2} (ref >=60)

## 2023-11-28 LAB — TSH: TSH: 0.68 m[IU]/L

## 2023-12-02 ENCOUNTER — Telehealth: Payer: Self-pay | Admitting: Family Medicine

## 2023-12-02 NOTE — Telephone Encounter (Signed)
I reached out to the patient to get her scheduled for Jamie and bp f/u. As per patient she is okay and don't need a appointment with jamie. And as for bp check she no longer needs it she has a primary care. Informed patient we are here whenever she needs her gyn f/u.

## 2023-12-10 ENCOUNTER — Other Ambulatory Visit: Payer: Self-pay

## 2023-12-10 DIAGNOSIS — E7800 Pure hypercholesterolemia, unspecified: Secondary | ICD-10-CM

## 2023-12-11 ENCOUNTER — Ambulatory Visit (INDEPENDENT_AMBULATORY_CARE_PROVIDER_SITE_OTHER): Payer: Self-pay | Admitting: Family

## 2023-12-11 ENCOUNTER — Encounter: Payer: Self-pay | Admitting: Family

## 2023-12-11 VITALS — BP 136/80 | HR 61 | Temp 98.0°F | Resp 18 | Ht 65.0 in | Wt 202.8 lb

## 2023-12-11 DIAGNOSIS — E785 Hyperlipidemia, unspecified: Secondary | ICD-10-CM

## 2023-12-11 DIAGNOSIS — L989 Disorder of the skin and subcutaneous tissue, unspecified: Secondary | ICD-10-CM

## 2023-12-11 DIAGNOSIS — I10 Essential (primary) hypertension: Secondary | ICD-10-CM

## 2023-12-11 MED ORDER — METOPROLOL TARTRATE 25 MG PO TABS
12.5000 mg | ORAL_TABLET | Freq: Two times a day (BID) | ORAL | 3 refills | Status: AC
Start: 2023-12-11 — End: ?

## 2023-12-11 MED ORDER — AMLODIPINE BESYLATE 10 MG PO TABS
10.0000 mg | ORAL_TABLET | Freq: Every day | ORAL | 1 refills | Status: AC
Start: 2023-12-11 — End: 2024-12-10

## 2023-12-11 NOTE — Progress Notes (Signed)
Provider: Richarda Blade FNP-C  Kemuel Buchmann, Donalee Citrin, NP  Patient Care Team: Doyle Kunath, Donalee Citrin, NP as PCP - General (Family Medicine)  Extended Emergency Contact Information Primary Emergency Contact: Santiago,Luis Address: [address]           [place], [place] [address] [place] Home Phone: [phone] Relation: Significant other Secondary Emergency Contact: Forestine Chute,  [place] Home Phone: [phone] Relation: Friend  Code Status:  Full Code  Goals of care: Advanced Directive information    12/11/2023   10:31 AM  Advanced Directives  Does Patient Have a Medical Advance Directive? No  Would patient like information on creating a medical advance directive? No - Patient declined     Chief Complaint  Patient presents with   Medical Management of Chronic Issues    2 week blood pressure check.    Discussed the use of AI scribe software for clinical note transcription with the patient, who gave verbal consent to proceed.  History of Present Illness   The patient presents for a follow-up visit for high blood pressure management.  Initially, her blood pressure readings at home were elevated, with the highest being 145/80 mmHg. Recently, readings have improved, with values as low as 113/77 mmHg in the mornings before eating. She restarted metoprolol on November 27, 2023, at a dose of 12.5 mg twice daily, and has been taking it alongside amlodipine. Initially, she experienced fatigue, particularly in the mornings, but this has lessened over time. No dizziness, palpitations, or chest pain.  Her recent blood work showed a total cholesterol level of 216 mg/dL, which is above the desired level of less than 200 mg/dL. Her LDL cholesterol is 123 mg/dL, which is also above the target of less than 100 mg/dL. She inquires about dietary changes to help lower her cholesterol.  She reports a weight of 202.8 pounds, a slight  increase from her previous weight of 201 pounds. She acknowledges the need to work on her diet and exercise but finds it challenging due to having a newborn and a two-year-old at home. She is not currently engaging in much exercise due to the cold weather and her responsibilities at home.   awaiting referral to specialist to evaluate skin lesion on the corners of the eye which seems to be getting bigger.        Past Medical History:  Diagnosis Date   AMA (advanced maternal age) multigravida 35+ 12/28/2021   Chronic hypertension    Gestational diabetes    History of cervical incompetence (cerclage in G3 and G4) 09/24/2017   Had cerclage, removed at 37 week visit  Current pregnancy:  Declined cerclage (see MFM note), getting serial CL scans     history of gestational diabetes 07/06/2021   Early 2  hr GTT normal     History of kidney stones    passed stone   History of VBAC 04/09/2017   c-section in [place]  VBAC x 1     Consent signed 08/01/2017   Hypertension    2014   Language barrier 06/17/2017   Pre-eclampsia    Past Surgical History:  Procedure Laterality Date   CERVICAL CERCLAGE N/A 04/12/2017   Procedure: CERCLAGE CERVICAL;  Surgeon: Hermina Staggers, MD;  Location: [facility] ORS;  Service: Gynecology;  Laterality: N/A;   CERVICAL CERCLAGE N/A 07/25/2021   Procedure: CERCLAGE CERVICAL;  Surgeon: Hermina Staggers, MD;  Location: [facility] LD  ORS;  Service: Gynecology;  Laterality: N/A;   CESAREAN SECTION     epidural - in [place]    Allergies  Allergen Reactions   Latex     burning   Sulfa Antibiotics Hives    Reports high fever and red rash on arms    Outpatient Encounter Medications as of 12/11/2023  Medication Sig   acetaminophen (TYLENOL) 325 MG tablet Take 2 tablets (650 mg total) by mouth every 6 (six) hours as needed (for pain scale < 4).   amLODipine (NORVASC) 10 MG tablet Take 1 tablet (10 mg total) by mouth daily.   ibuprofen (ADVIL) 600 MG tablet Take 1 tablet (600 mg total)  by mouth every 6 (six) hours as needed.   metoprolol tartrate (LOPRESSOR) 25 MG tablet Take 0.5 tablets (12.5 mg total) by mouth 2 (two) times daily.   Prenatal Vit-Fe Fumarate-FA (PRENATAL MULTIVITAMIN) TABS tablet Take 1 tablet by mouth daily.   No facility-administered encounter medications on file as of 12/11/2023.    Review of Systems  Constitutional:  Negative for appetite change, chills, fatigue, fever and unexpected weight change.  HENT:  Negative for congestion, ear discharge, ear pain, hearing loss, nosebleeds, postnasal drip, rhinorrhea, sinus pressure, sinus pain, sneezing and sore throat.   Eyes:  Negative for pain, discharge, redness, itching and visual disturbance.  Respiratory:  Negative for cough, chest tightness, shortness of breath and wheezing.   Cardiovascular:  Negative for chest pain, palpitations and leg swelling.  Gastrointestinal:  Negative for abdominal distention, abdominal pain, blood in stool, constipation, diarrhea, nausea and vomiting.  Genitourinary:  Negative for difficulty urinating, dysuria, flank pain, frequency and urgency.  Musculoskeletal:  Negative for arthralgias, back pain, gait problem, joint swelling and myalgias.  Skin:  Negative for color change, pallor, rash and wound.       Bilateral eye skin lesion   Neurological:  Negative for dizziness, syncope, speech difficulty, weakness, light-headedness, numbness and headaches.    Immunization History  Administered Date(s) Administered   Influenza, Seasonal, Injecte, Preservative Fre 07/22/2023   Influenza,inj,Quad PF,6+ Mos 08/01/2017, 07/19/2021   Influenza-Unspecified 02/11/2017   PFIZER(Purple Top)SARS-COV-2 Vaccination 02/12/2020, 03/04/2020   Tdap 06/02/2015, 06/17/2017, 07/22/2023   Pertinent  Health Maintenance Due  Topic Date Due   INFLUENZA VACCINE  Completed      12/29/2021    7:31 AM 12/29/2021    8:47 PM 12/30/2021    9:00 AM 11/27/2023    9:31 AM 12/11/2023   10:31 AM  Fall Risk   Falls in the past year?    0 0  Was there an injury with Fall?    0 0  Fall Risk Category Calculator    0 0  (RETIRED) Patient Fall Risk Level Low fall risk Low fall risk Low fall risk     Functional Status Survey:    Vitals:   12/11/23 1033  BP: 136/80  Pulse: 61  Resp: 18  Temp: 98 F (36.7 C)  SpO2: 99%  Weight: 202 lb 12.8 oz (92 kg)  Height: 5\' 5"  (1.651 m)   Body mass index is 33.75 kg/m. Physical Exam Vitals reviewed.  Constitutional:      General: She is not in acute distress.    Appearance: Normal appearance. She is normal weight. She is not ill-appearing or diaphoretic.  HENT:     Head: Normocephalic.     Mouth/Throat:     Mouth: Mucous membranes are moist.     Pharynx: Oropharynx is clear. No oropharyngeal exudate  or posterior oropharyngeal erythema.  Eyes:     General: No scleral icterus.       Right eye: No discharge.        Left eye: No discharge.     Conjunctiva/sclera: Conjunctivae normal.     Pupils: Pupils are equal, round, and reactive to light.  Neck:     Vascular: No carotid bruit.  Cardiovascular:     Rate and Rhythm: Normal rate and regular rhythm.     Pulses: Normal pulses.     Heart sounds: Normal heart sounds. No murmur heard.    No friction rub. No gallop.  Pulmonary:     Effort: Pulmonary effort is normal. No respiratory distress.     Breath sounds: Normal breath sounds. No wheezing, rhonchi or rales.  Chest:     Chest wall: No tenderness.  Abdominal:     General: Bowel sounds are normal. There is no distension.     Palpations: Abdomen is soft. There is no mass.     Tenderness: There is no abdominal tenderness. There is no right CVA tenderness, left CVA tenderness, guarding or rebound.  Musculoskeletal:        General: No swelling or tenderness. Normal range of motion.     Cervical back: Normal range of motion. No rigidity or tenderness.     Right lower leg: No edema.     Left lower leg: No edema.  Lymphadenopathy:     Cervical:  No cervical adenopathy.  Skin:    General: Skin is warm and dry.     Coloration: Skin is not pale.     Findings: Lesion present. No bruising, erythema or rash.     Comments: Bilateral eye skin lesion   Neurological:     Mental Status: She is alert and oriented to person, place, and time.     Cranial Nerves: No cranial nerve deficit.     Sensory: No sensory deficit.     Motor: No weakness.     Coordination: Coordination normal.     Gait: Gait normal.  Psychiatric:        Mood and Affect: Mood normal.        Speech: Speech normal.        Behavior: Behavior normal.     Labs reviewed: Recent Labs    09/25/23 1242 10/04/23 1343 11/27/23 1034  NA 133* 136 139  K 4.5 3.5 4.0  CL 105 104 104  CO2 17* 23 28  GLUCOSE 74 97 97  BUN 7 11 12   CREATININE 0.45 0.46 0.43*  CALCIUM 8.8* 9.1 9.4   Recent Labs    04/04/23 1237 09/25/23 1242 10/04/23 1343 11/27/23 1034  AST 13 26 22 23   ALT 10 13 25 18   ALKPHOS 77 132* 80  --   BILITOT 0.2 0.4 0.3 0.3  PROT 7.3 6.6 7.2 7.6  ALBUMIN 4.0 2.7* 3.2*  --    Recent Labs    04/04/23 1237 07/22/23 0844 09/26/23 0455 10/04/23 1343 11/27/23 1034  WBC 7.8   < > 12.4* 7.9 5.1  NEUTROABS 5.5  --   --   --  3,091  HGB 12.5   < > 10.8* 11.7* 12.7  HCT 37.7   < > 32.7* 35.5* 39.4  MCV 84   < > 81.1 82.4 84.0  PLT 349   < > 256 462* 351   < > = values in this interval not displayed.   Lab Results  Component Value Date  TSH 0.68 11/27/2023   Lab Results  Component Value Date   HGBA1C 5.9 (H) 04/04/2023   Lab Results  Component Value Date   CHOL 216 (H) 11/27/2023   HDL 72 11/27/2023   LDLCALC 123 (H) 11/27/2023   TRIG 105 11/27/2023   CHOLHDL 3.0 11/27/2023    Significant Diagnostic Results in last 30 days:  No results found.  Assessment/Plan  Hypertension Follow-up for hypertension. Initial home readings were 145/80 mmHg, improved to 113/77 mmHg in the mornings. Current office reading is 136/80 mmHg. On metoprolol  12.5 mg twice daily and amlodipine. Initial side effects of fatigue have improved. No dizziness, palpitations, or chest pain. Weight increased from 201 lbs to 202.8 lbs. Discussed home blood pressure monitoring, especially 1-2 hours post-medication, to avoid hypotension (below 90/60 mmHg). Encouraged weight loss through diet and exercise, considering home-based activities due to weather and childcare constraints. - Continue metoprolol 12.5 mg twice daily - Continue amlodipine - Monitor blood pressure at home, especially 1-2 hours post-medication - Avoid blood pressure dropping below 90/60 mmHg - Encourage weight loss through diet and exercise - Refill metoprolol and amlodipine for 90 days - Follow up in 6 months or sooner if issues arise - TSH; Future - COMPLETE METABOLIC PANEL WITH GFR; Future - CBC with Differential/Platelet; Future - Lipid Panel; Future - amLODipine (NORVASC) 10 MG tablet; Take 1 tablet (10 mg total) by mouth daily.  Dispense: 90 tablet; Refill: 1 - metoprolol tartrate (LOPRESSOR) 25 MG tablet; Take 0.5 tablets (12.5 mg total) by mouth 2 (two) times daily.  Dispense: 180 tablet; Refill: 3  Hyperlipidemia Total cholesterol elevated at 216 mg/dL. LDL cholesterol elevated at 123 mg/dL. HDL cholesterol and triglycerides are within normal limits. Discussed dietary modifications and exercise as first-line interventions. Advised reducing cheese intake and avoiding burning oil to prevent conversion to saturated fats. Emphasized portion control and increasing vegetable intake. - Encourage dietary changes to reduce cholesterol, including reducing cheese intake and avoiding burning oil - Increase exercise, including home-based activities - Recheck cholesterol levels in 6 months - Lipid Panel; Future  Skin lesion  - on bilateral corners of the eye seems to be getting bigger. - refer to general surgery  - Ambulatory referral to General Surgery  General Health Maintenance Reviewed  recent blood work. CBC, kidney function, electrolytes, and liver enzymes are within normal limits. Hemoglobin improved from 11.7 to 12.7 g/dL. Thyroid function and glucose levels are normal. - Continue current health maintenance practices - Recheck blood work in 6 months  Follow-up - Follow up in 6 months for routine check-up and lab work - Solicitor the clinic if any issues or questions arise before the next appointment.      Family/ staff Communication: Reviewed plan of care with patient and mother verbalized understanding via spanish interpreter   Labs/tests ordered:  - TSH; Future - COMPLETE METABOLIC PANEL WITH GFR; Future - CBC with Differential/Platelet; Future - Lipid Panel; Future  Next Appointment: Return in about 6 months (around 06/09/2024) for medical mangement of chronic issues.Caesar Bookman, NP

## 2023-12-12 NOTE — Addendum Note (Signed)
Addended byRicharda Blade C on: 12/12/2023 03:41 PM   Modules accepted: Orders

## 2023-12-18 NOTE — Telephone Encounter (Signed)
 Message translated by google translate:  Good afternoon Doctor, I don't get my medicines. They went to the pharmacy because it says they are not there Metroprolol and amlodipine  And I was leaving. Make a reference for some balls in my eyes

## 2023-12-31 ENCOUNTER — Telehealth: Payer: Self-pay

## 2023-12-31 NOTE — Telephone Encounter (Signed)
Patient called with multiple concerns:  1.) Patient went to [facility] and was told no rx's received from our office. RX's was sent on 12/11/23, receipt confirmed by pharmacy. Outgoing call placed pharmacy (not open), left detailed voicemail with RX information.   2.) Patient has not heard about referral. Patient is aware that I will have the referral coordinator follow-up with up

## 2024-02-24 ENCOUNTER — Ambulatory Visit: Payer: Self-pay | Admitting: Dermatology

## 2024-03-24 ENCOUNTER — Telehealth: Payer: Self-pay

## 2024-03-24 NOTE — Telephone Encounter (Signed)
 Telephoned patient at mobile number using interpreter, Bonnie Morales. Patient will call back when she stops breast feeding and schedule mammogram appointment. [facility] (scholarship)

## 2024-04-08 ENCOUNTER — Other Ambulatory Visit: Payer: Self-pay

## 2024-04-08 DIAGNOSIS — I10 Essential (primary) hypertension: Secondary | ICD-10-CM

## 2024-04-08 DIAGNOSIS — E7800 Pure hypercholesterolemia, unspecified: Secondary | ICD-10-CM

## 2024-04-08 DIAGNOSIS — E785 Hyperlipidemia, unspecified: Secondary | ICD-10-CM

## 2024-05-04 ENCOUNTER — Ambulatory Visit (INDEPENDENT_AMBULATORY_CARE_PROVIDER_SITE_OTHER): Payer: Self-pay | Admitting: Dermatology

## 2024-05-04 ENCOUNTER — Encounter: Payer: Self-pay | Admitting: Dermatology

## 2024-05-04 DIAGNOSIS — D492 Neoplasm of unspecified behavior of bone, soft tissue, and skin: Secondary | ICD-10-CM

## 2024-05-04 DIAGNOSIS — D485 Neoplasm of uncertain behavior of skin: Secondary | ICD-10-CM

## 2024-05-04 DIAGNOSIS — D2339 Other benign neoplasm of skin of other parts of face: Secondary | ICD-10-CM

## 2024-05-04 DIAGNOSIS — R238 Other skin changes: Secondary | ICD-10-CM

## 2024-05-04 DIAGNOSIS — D239 Other benign neoplasm of skin, unspecified: Secondary | ICD-10-CM

## 2024-05-04 NOTE — Patient Instructions (Signed)

## 2024-05-04 NOTE — Progress Notes (Signed)
   Follow-Up Visit   Subjective  Bonnie Morales is a 43 y.o. female who presents for the following: small bumps on the corner of her eyes that have been present for 3 years. The have never drained or bled.  Patient does not wear contacts.  Denies evaluation by opthalmology Patient accompanied by Spanish Interpreter.  The patient has spots, moles and lesions to be evaluated, some may be new or changing.   The following portions of the chart were reviewed this encounter and updated as appropriate: medications, allergies, medical history  Review of Systems:  No other skin or systemic complaints except as noted in HPI or Assessment and Plan.  Objective  Well appearing patient in no apparent distress; mood and affect are within normal limits.   A focused examination was performed of the following areas: face  Relevant exam findings are noted in the Assessment and Plan.   Right Malar Cheek 0.9 cm Flesh colored vesicle   Assessment & Plan   NEOPLASM OF UNCERTAIN BEHAVIOR OF SKIN Right Malar Cheek Skin / nail biopsy Type of biopsy: tangential   Timeout: patient name, date of birth, surgical site, and procedure verified   Anesthesia: the lesion was anesthetized in a standard fashion   Anesthetic:  1% lidocaine  w/ epinephrine  1-100,000 buffered w/ 8.4% NaHCO3 Instrument used: DermaBlade   Hemostasis achieved with: electrodesiccation   Outcome: patient tolerated procedure well   Post-procedure details: sterile dressing applied and wound care instructions given   Dressing type: petrolatum and bandage   Specimen 1 - Surgical pathology Differential Diagnosis: r/o hidrocystoma vs other  Check Margins: No If results are positive for hidrocystoma, she will be scheduled in clinic for removal of all lesions. If there is another diagnosis we may refer to opthalmology for evaluation.  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 On examination, the lesions appear as small, translucent, dome-shaped papules consistent with benign adnexal tumors, most likely hidrocystomas. Differential diagnosis includes other benign cystic lesions, though clinical appearance favors a benign etiology. A biopsy of one lesion will be performed today to obtain histopathologic confirmation. Definitive treatment options, such as surgical excision, cryotherapy, or laser ablation, will be discussed once the pathology results are available. The patient understands the plan and agrees to proceed.  Return pending pathology results.SABRA LILLETTE Rollene Charlann, RN, am acting as scribe for RUFUS CHRISTELLA HOLY, MD .   Documentation: I have reviewed the above documentation for accuracy and completeness, and I agree with the above.  RUFUS CHRISTELLA HOLY, MD

## 2024-05-05 LAB — SURGICAL PATHOLOGY

## 2024-05-06 ENCOUNTER — Ambulatory Visit: Payer: Self-pay | Admitting: Dermatology

## 2024-05-06 NOTE — Telephone Encounter (Signed)
 Patient reached and communicated with through interpretor. Patient is aware of biopsy results and would like to proceed with removal. She is aware that the schedulers will call to schedule the appointment.

## 2024-05-06 NOTE — Telephone Encounter (Signed)
-----   Message from [facility] PACI sent at 05/06/2024  9:27 AM EDT ----- Right lateral canthus- hidrocystoma- benign lesion but can be scheduled for excision for lesions if desired.   Please call to let her know. Thank you  ----- Message ----- From: Interface, [facility] Sent: 05/05/2024   5:45 PM EDT To: Rufus CHRISTELLA Holy, MD

## 2024-05-14 ENCOUNTER — Encounter: Payer: Self-pay | Admitting: Dermatology

## 2024-05-25 ENCOUNTER — Ambulatory Visit: Payer: Self-pay | Admitting: Dermatology

## 2024-05-25 ENCOUNTER — Encounter: Payer: Self-pay | Admitting: Dermatology

## 2024-05-25 VITALS — BP 139/77 | HR 85

## 2024-05-25 DIAGNOSIS — D2310 Other benign neoplasm of skin of unspecified eyelid, including canthus: Secondary | ICD-10-CM

## 2024-05-25 NOTE — Patient Instructions (Signed)

## 2024-05-25 NOTE — Progress Notes (Signed)
 Follow-Up Visit   Subjective  Bonnie Morales is a 43 y.o. female who presents for the following: Excision of hydrocystoma on the right lateral canthus.  The following portions of the chart were reviewed this encounter and updated as appropriate: medications, allergies, medical history  Review of Systems:  No other skin or systemic complaints except as noted in HPI or Assessment and Plan.  Objective  Well appearing patient in no apparent distress; mood and affect are within normal limits.  A focused examination was performed of the following areas: Right canthus Relevant physical exam findings are noted in the Assessment and Plan.   Right Temple Fluid filled vesicle   Assessment & Plan   HIDROCYSTOMA OF EYELID, LEFT Right Temple Skin excision - Right Temple  Excision method:  elliptical Lesion length (cm):  0.6 Lesion width (cm):  0.3 Margin per side (cm):  0.1 Total excision diameter (cm):  0.8 Informed consent: discussed and consent obtained   Timeout: patient name, date of birth, surgical site, and procedure verified   Procedure prep:  Patient was prepped and draped in usual sterile fashion Prep type:  Chlorhexidine  Anesthesia: the lesion was anesthetized in a standard fashion   Anesthetic:  1% lidocaine  w/ epinephrine  1-100,000 buffered w/ 8.4% NaHCO3 Instrument used: #15 blade   Hemostasis achieved with: suture, pressure and electrodesiccation   Outcome: patient tolerated procedure well with no complications   Post-procedure details: sterile dressing applied and wound care instructions given   Dressing type: bandage, pressure dressing and petrolatum    Skin repair - Right Temple Complexity:  Complex Final length (cm):  1 Informed consent: discussed and consent obtained   Timeout: patient name, date of birth, surgical site, and procedure verified   Procedure prep:  Patient was prepped and draped in usual sterile fashion Prep type:   Chlorhexidine  Anesthesia: the lesion was anesthetized in a standard fashion   Anesthetic:  1% lidocaine  w/ epinephrine  1-100,000 buffered w/ 8.4% NaHCO3 Reason for type of repair: reduce tension to allow closure and preserve normal anatomy   Undermining: area extensively undermined   Subcutaneous layers (deep stitches):  Suture size:  6-0 Suture type: Monocryl (poliglecaprone 25)   Stitches:  Buried horizontal mattress Fine/surface layer approximation (top stitches):  Suture size:  6-0 Stitches: simple running   Hemostasis achieved with: suture, pressure and electrodesiccation Outcome: patient tolerated procedure well with no complications   Post-procedure details: sterile dressing applied and wound care instructions given   Dressing type: bandage, pressure dressing and petrolatum    Specimen 1 - Surgical pathology Differential Diagnosis: hydrocystoma  Previous bx: [id number]  Check Margins: Yes  The surgical wound was then cleaned, prepped, and re-anesthetized as above. Wound edges were undermined extensively along at least one entire edge and at a distance equal to or greater than the width of the defect (see wound defect size above) in order to achieve closure and decrease wound tension and anatomic distortion. Redundant tissue repair including standing cone removal was performed. Hemostasis was achieved with electrocautery. Subcutaneous and epidermal tissues were approximated with the above sutures. The surgical site was then lightly scrubbed with sterile, saline-soaked gauze. The area was then bandaged using Vaseline ointment, non-adherent gauze, gauze pads, and tape to provide an adequate pressure dressing. The patient tolerated the procedure well, was given detailed written and verbal wound care instructions, and was discharged in good condition.   The patient will follow-up: 4 weeks.  Return in about 2 weeks (around 06/08/2024) for return for  excision of left canthus.  I,  Berwyn Lesches, Surg Tech III, am acting as scribe for RUFUS CHRISTELLA HOLY, MD.   Documentation: I have reviewed the above documentation for accuracy and completeness, and I agree with the above.  RUFUS CHRISTELLA HOLY, MD

## 2024-05-26 LAB — SURGICAL PATHOLOGY

## 2024-05-27 ENCOUNTER — Ambulatory Visit: Payer: Self-pay | Admitting: Dermatology

## 2024-06-09 ENCOUNTER — Encounter: Payer: Self-pay | Admitting: Family

## 2024-06-15 ENCOUNTER — Encounter: Payer: Self-pay | Admitting: Dermatology

## 2024-06-15 ENCOUNTER — Ambulatory Visit: Payer: Self-pay | Admitting: Dermatology

## 2024-06-15 VITALS — BP 136/81 | HR 66

## 2024-06-15 DIAGNOSIS — D485 Neoplasm of uncertain behavior of skin: Secondary | ICD-10-CM

## 2024-06-15 DIAGNOSIS — D23111 Other benign neoplasm of skin of right upper eyelid, including canthus: Secondary | ICD-10-CM

## 2024-06-15 DIAGNOSIS — L905 Scar conditions and fibrosis of skin: Secondary | ICD-10-CM

## 2024-06-15 DIAGNOSIS — D23121 Other benign neoplasm of skin of left upper eyelid, including canthus: Secondary | ICD-10-CM

## 2024-06-15 DIAGNOSIS — D239 Other benign neoplasm of skin, unspecified: Secondary | ICD-10-CM

## 2024-06-15 DIAGNOSIS — D23122 Other benign neoplasm of skin of left lower eyelid, including canthus: Secondary | ICD-10-CM

## 2024-06-15 DIAGNOSIS — D23112 Other benign neoplasm of skin of right lower eyelid, including canthus: Secondary | ICD-10-CM

## 2024-06-15 NOTE — Progress Notes (Signed)
 Follow-Up Visit   Subjective  Bonnie Morales is a 43 y.o. female who presents for the following: Excision of Neoplasm of Skin on the left lateral canthus.  She is s/p WLE for a hidrocystoma on the right lateral canthus, treated on 05/25/2024, repaired with linear closure.   The following portions of the chart were reviewed this encounter and updated as appropriate: medications, allergies, medical history.  Review of Systems:  No other skin or systemic complaints except as noted in HPI or Assessment and Plan.  Objective  Well appearing patient in no apparent distress; mood and affect are within normal limits.  A focused examination was performed of the following areas: Left lateral canthus Relevant physical exam findings are noted in the Assessment and Plan.      Left Lateral Canthus Fluid filled vesicle   Assessment & Plan   NEOPLASM OF UNCERTAIN BEHAVIOR OF SKIN Left Lateral Canthus Skin excision  Excision method:  elliptical Lesion length (cm):  1.1 Lesion width (cm):  0.5 Margin per side (cm):  0.1 Total excision diameter (cm):  1.3 Informed consent: discussed and consent obtained   Timeout: patient name, date of birth, surgical site, and procedure verified   Procedure prep:  Patient was prepped and draped in usual sterile fashion Prep type:  Povidone-iodine  Anesthesia: the lesion was anesthetized in a standard fashion   Anesthetic:  1% lidocaine  w/ epinephrine  1-100,000 buffered w/ 8.4% NaHCO3 Instrument used: #15 blade   Hemostasis achieved with: suture, pressure and electrodesiccation   Hemostasis achieved with comment:  6.0 monocryl, 6.0 plain gut Outcome: patient tolerated procedure well with no complications   Post-procedure details: sterile dressing applied and wound care instructions given   Dressing type: bandage and petrolatum   Additional details:  Final length 1.2  Skin repair Complexity:  Complex Final length (cm):  1.2 Informed  consent: discussed and consent obtained   Timeout: patient name, date of birth, surgical site, and procedure verified   Procedure prep:  Patient was prepped and draped in usual sterile fashion Prep type:  Povidone-iodine  Anesthesia: the lesion was anesthetized in a standard fashion   Anesthetic:  1% lidocaine  w/ epinephrine  1-100,000 buffered w/ 8.4% NaHCO3 Reason for type of repair: reduce tension to allow closure, avoid adjacent structures, allow side-to-side closure without requiring a flap or graft and compensate for the inelasticity of skin in this area   Undermining: area extensively undermined   Subcutaneous layers (deep stitches):  Suture size:  6-0 Suture type: Monocryl (poliglecaprone 25)   Stitches:  Buried vertical mattress Fine/surface layer approximation (top stitches):  Suture size:  6-0 Suture type: fast-absorbing plain gut   Hemostasis achieved with: suture, pressure and electrodesiccation Outcome: patient tolerated procedure well with no complications   Post-procedure details: sterile dressing applied and wound care instructions given   Dressing type: bandage and petrolatum    Specimen 1 - Surgical pathology Differential Diagnosis: R/O hidrocystoma vs other  Check Margins: No  The surgical wound was then cleaned, prepped, and re-anesthetized as above. Wound edges were undermined extensively along at least one entire edge and at a distance equal to or greater than the width of the defect (see wound defect size above) in order to achieve closure and decrease wound tension and anatomic distortion. Redundant tissue repair including standing cone removal was performed. Hemostasis was achieved with electrocautery. Subcutaneous and epidermal tissues were approximated with the above sutures. The surgical site was then lightly scrubbed with sterile, saline-soaked gauze. The area was then  bandaged using Vaseline ointment, non-adherent gauze, gauze pads, and tape to provide an  adequate pressure dressing. The patient tolerated the procedure well, was given detailed written and verbal wound care instructions, and was discharged in good condition.   The patient will follow-up: 4 weeks.  Scar s/p WLE for hidrocystoma on the right lateral canthus, treated on 05/25/2024, repaired with linear closure - Reassured that wound has healed well - Discussed that scars take up to 12 months to mature from the date of surgery - Recommend SPF 30+ to scar daily to prevent purple color - OK to start scar massage at 4-6 weeks post-op - Can consider silicone based products for scar healing  Hidrocystoma- Right lateral canthus Patient was counseled regarding diagnosis of hidrocystoma, a benign cystic lesion arising from sweat glands, typically the apocrine type. These lesions most commonly occur on the eyelids or face and appear as small, translucent or bluish papules. They are non-cancerous and do not pose a risk of malignancy. Treatment is not medically necessary unless the lesion is symptomatic or for cosmetic concerns. Options for removal include simple excision, electrodessication, or laser therapy, though recurrence can occur. The patient was advised to monitor for any changes in size, color, or symptoms, and to return if removal is desired or if there are any concerning changes.  Return in about 4 weeks (around 07/13/2024) for wound check.  I, Darice Smock, CMA, am acting as scribe for RUFUS CHRISTELLA HOLY, MD.   Documentation: I have reviewed the above documentation for accuracy and completeness, and I agree with the above.  RUFUS CHRISTELLA HOLY, MD

## 2024-06-15 NOTE — Patient Instructions (Signed)

## 2024-06-18 LAB — SURGICAL PATHOLOGY

## 2024-06-18 NOTE — Progress Notes (Signed)
 This encounter was created in error - please disregard.

## 2024-06-22 ENCOUNTER — Ambulatory Visit: Payer: Self-pay | Admitting: Dermatology

## 2024-07-16 ENCOUNTER — Ambulatory Visit: Payer: Self-pay | Admitting: Dermatology

## 2024-12-04 ENCOUNTER — Other Ambulatory Visit: Payer: Self-pay

## 2024-12-04 DIAGNOSIS — Z87898 Personal history of other specified conditions: Secondary | ICD-10-CM

## 2024-12-17 ENCOUNTER — Ambulatory Visit: Payer: Self-pay

## 2024-12-22 ENCOUNTER — Other Ambulatory Visit: Payer: Self-pay

## 2025-01-14 ENCOUNTER — Ambulatory Visit: Payer: Self-pay
